# Patient Record
Sex: Male | Born: 1935 | Race: White | Hispanic: No | Smoking: Never smoker
Health system: Southern US, Community
[De-identification: ages and names within clinical notes are randomized; demographics above are authoritative.]

## PROBLEM LIST (undated history)

## (undated) DIAGNOSIS — R9430 Abnormal result of cardiovascular function study, unspecified: Secondary | ICD-10-CM

## (undated) DIAGNOSIS — W19XXXA Unspecified fall, initial encounter: Secondary | ICD-10-CM

## (undated) DIAGNOSIS — R001 Bradycardia, unspecified: Secondary | ICD-10-CM

## (undated) DIAGNOSIS — R39198 Other difficulties with micturition: Secondary | ICD-10-CM

## (undated) DIAGNOSIS — I358 Other nonrheumatic aortic valve disorders: Secondary | ICD-10-CM

## (undated) DIAGNOSIS — M1990 Unspecified osteoarthritis, unspecified site: Secondary | ICD-10-CM

## (undated) DIAGNOSIS — I4891 Unspecified atrial fibrillation: Secondary | ICD-10-CM

## (undated) DIAGNOSIS — N183 Chronic kidney disease, stage 3 unspecified: Secondary | ICD-10-CM

## (undated) DIAGNOSIS — I4892 Unspecified atrial flutter: Secondary | ICD-10-CM

## (undated) DIAGNOSIS — I351 Nonrheumatic aortic (valve) insufficiency: Secondary | ICD-10-CM

## (undated) DIAGNOSIS — K589 Irritable bowel syndrome without diarrhea: Secondary | ICD-10-CM

## (undated) HISTORY — DX: Abnormal result of cardiovascular function study, unspecified: R94.30

## (undated) HISTORY — PX: HAND SURGERY: SHX662

## (undated) HISTORY — PX: CATARACT EXTRACTION W/ INTRAOCULAR LENS  IMPLANT, BILATERAL: SHX1307

## (undated) HISTORY — DX: Unspecified osteoarthritis, unspecified site: M19.90

## (undated) HISTORY — PX: BACK SURGERY: SHX140

## (undated) HISTORY — DX: Unspecified atrial flutter: I48.92

## (undated) HISTORY — DX: Other nonrheumatic aortic valve disorders: I35.8

## (undated) HISTORY — DX: Irritable bowel syndrome, unspecified: K58.9

## (undated) HISTORY — DX: Nonrheumatic aortic (valve) insufficiency: I35.1

## (undated) HISTORY — DX: Bradycardia, unspecified: R00.1

## (undated) HISTORY — PX: FRACTURE SURGERY: SHX138

## (undated) HISTORY — DX: Other difficulties with micturition: R39.198

---

## 1970-08-31 HISTORY — PX: LEG SURGERY: SHX1003

## 1999-03-13 ENCOUNTER — Emergency Department (HOSPITAL_COMMUNITY): Admission: EM | Admit: 1999-03-13 | Discharge: 1999-03-13 | Payer: Self-pay | Admitting: *Deleted

## 1999-03-13 ENCOUNTER — Encounter: Payer: Self-pay | Admitting: Surgery

## 2008-02-15 ENCOUNTER — Emergency Department (HOSPITAL_COMMUNITY): Admission: EM | Admit: 2008-02-15 | Discharge: 2008-02-16 | Payer: Self-pay | Admitting: Emergency Medicine

## 2008-02-20 ENCOUNTER — Encounter: Admission: RE | Admit: 2008-02-20 | Discharge: 2008-02-20 | Payer: Self-pay | Admitting: Orthopedic Surgery

## 2008-02-22 ENCOUNTER — Ambulatory Visit (HOSPITAL_COMMUNITY): Admission: RE | Admit: 2008-02-22 | Discharge: 2008-02-22 | Payer: Self-pay | Admitting: Orthopedic Surgery

## 2008-03-07 ENCOUNTER — Encounter: Admission: RE | Admit: 2008-03-07 | Discharge: 2008-03-07 | Payer: Self-pay | Admitting: Radiology

## 2009-06-22 ENCOUNTER — Emergency Department (HOSPITAL_COMMUNITY): Admission: EM | Admit: 2009-06-22 | Discharge: 2009-06-23 | Payer: Self-pay | Admitting: Emergency Medicine

## 2010-12-04 LAB — DIFFERENTIAL
Basophils Relative: 0 % (ref 0–1)
Eosinophils Absolute: 0 10*3/uL (ref 0.0–0.7)
Lymphs Abs: 0.9 10*3/uL (ref 0.7–4.0)
Neutro Abs: 5.2 10*3/uL (ref 1.7–7.7)
Neutrophils Relative %: 72 % (ref 43–77)

## 2010-12-04 LAB — BASIC METABOLIC PANEL
BUN: 12 mg/dL (ref 6–23)
Calcium: 8.6 mg/dL (ref 8.4–10.5)
Chloride: 100 mEq/L (ref 96–112)
Creatinine, Ser: 0.9 mg/dL (ref 0.4–1.5)
GFR calc Af Amer: >60 mL/min (ref >60)

## 2010-12-04 LAB — CBC
MCHC: 33.7 g/dL (ref 30.0–36.0)
MCV: 99.9 fL (ref 78.0–100.0)
Platelets: 212 10*3/uL (ref 150–400)
WBC: 7.1 10*3/uL (ref 4.0–10.5)

## 2011-03-02 DIAGNOSIS — K4020 Bilateral inguinal hernia, without obstruction or gangrene, not specified as recurrent: Secondary | ICD-10-CM

## 2011-03-02 HISTORY — PX: INGUINAL HERNIA REPAIR: SUR1180

## 2011-03-16 ENCOUNTER — Ambulatory Visit (INDEPENDENT_AMBULATORY_CARE_PROVIDER_SITE_OTHER): Payer: Medicare Other | Admitting: Surgery

## 2011-03-16 ENCOUNTER — Encounter (INDEPENDENT_AMBULATORY_CARE_PROVIDER_SITE_OTHER): Payer: Self-pay | Admitting: Surgery

## 2011-03-16 DIAGNOSIS — K4090 Unilateral inguinal hernia, without obstruction or gangrene, not specified as recurrent: Secondary | ICD-10-CM

## 2011-03-16 NOTE — Progress Notes (Signed)
Subjective:     Patient ID: Patrick Walker, male   DOB: November 25, 1924, 77 y.o.   MRN: [id number]  HPI  The patient comes today feeling better. Has some abdominal soreness. Has a lump in the left groin.  Constipation resolved. Urinating fine. Not taking any pain meds. And comes today with his son. Once to drive his tractor. Review of Systems  Constitutional: Negative for fever, chills and diaphoresis.  HENT: Negative for nosebleeds, sore throat, facial swelling, mouth sores, trouble swallowing and ear discharge.   Eyes: Negative for photophobia, discharge and visual disturbance.  Respiratory: Negative for choking, chest tightness, shortness of breath and stridor.   Cardiovascular: Negative for chest pain and palpitations.  Gastrointestinal: Negative for nausea, vomiting, diarrhea, constipation, blood in stool, abdominal distention, anal bleeding and rectal pain.  Genitourinary: Negative for dysuria, urgency, difficulty urinating and testicular pain.  Musculoskeletal: Negative for myalgias, back pain, arthralgias and gait problem.  Skin: Negative for color change, pallor, rash and wound.  Neurological: Negative for dizziness, speech difficulty, weakness, numbness and headaches.  Hematological: Negative for adenopathy. Does not bruise/bleed easily.  Psychiatric/Behavioral: Negative for hallucinations, confusion and agitation.       Objective:   Physical Exam  Constitutional: He is oriented to person, place, and time. He appears well-developed and well-nourished. No distress.  HENT:  Head: Normocephalic.  Right Ear: Decreased hearing is noted.  Left Ear: Decreased hearing is noted.  Mouth/Throat: Oropharynx is clear and moist. No oropharyngeal exudate.  Eyes: Conjunctivae and EOM are normal. Pupils are equal, round, and reactive to light. No scleral icterus.  Neck: Normal range of motion. Neck supple. No tracheal deviation present.  Cardiovascular: Normal rate, regular rhythm and intact  distal pulses.   Pulmonary/Chest: Effort normal and breath sounds normal. No respiratory distress.  Abdominal: Soft. He exhibits no distension. There is no tenderness. Hernia confirmed negative in the right inguinal area and confirmed negative in the left inguinal area.       Incisions c/d/i.  Mild soreness  Genitourinary: Penis normal. No penile tenderness.       Small L groin seroma 1x2 cm.  No hernia  Musculoskeletal: Normal range of motion. He exhibits no tenderness.  Lymphadenopathy:    He has no cervical adenopathy.       Right: No inguinal adenopathy present.       Left: No inguinal adenopathy present.  Neurological: He is alert and oriented to person, place, and time. No cranial nerve deficit. He exhibits normal muscle tone. Coordination normal.  Skin: Skin is warm and dry. No rash noted. He is not diaphoretic. No erythema. No pallor.  Psychiatric: He has a normal mood and affect. His behavior is normal. Judgment and thought content normal.       Assessment:     Laparoscopic left inguinal hernia repair  Small postoperative seroma. Doing well.    Plan:     Increase activity as tolerated. Do not push through pain. Consider Tylenol and heat for soreness. Seroma should resolve.  Return to clinic p.r.n. He and his son expressed appreciation.

## 2011-05-28 LAB — BASIC METABOLIC PANEL
Calcium: 9.1
GFR calc Af Amer: >60
GFR calc non Af Amer: >60
Potassium: 4.2
Sodium: 138

## 2011-05-28 LAB — PROTIME-INR: INR: 1

## 2011-05-28 LAB — CBC
Hemoglobin: 13.6
RBC: 3.88 — ABNORMAL LOW
WBC: 4

## 2011-07-28 ENCOUNTER — Other Ambulatory Visit (HOSPITAL_COMMUNITY): Payer: Self-pay | Admitting: Radiology

## 2011-07-28 ENCOUNTER — Ambulatory Visit (HOSPITAL_COMMUNITY): Payer: Medicare Other | Attending: Cardiology | Admitting: Radiology

## 2011-07-28 ENCOUNTER — Encounter: Payer: Self-pay | Admitting: Cardiology

## 2011-07-28 ENCOUNTER — Ambulatory Visit (INDEPENDENT_AMBULATORY_CARE_PROVIDER_SITE_OTHER): Payer: Medicare Other | Admitting: Cardiology

## 2011-07-28 VITALS — BP 153/98 | HR 150 | Ht 63.0 in | Wt 117.8 lb

## 2011-07-28 DIAGNOSIS — I059 Rheumatic mitral valve disease, unspecified: Secondary | ICD-10-CM | POA: Insufficient documentation

## 2011-07-28 DIAGNOSIS — I379 Nonrheumatic pulmonary valve disorder, unspecified: Secondary | ICD-10-CM | POA: Insufficient documentation

## 2011-07-28 DIAGNOSIS — I4892 Unspecified atrial flutter: Secondary | ICD-10-CM

## 2011-07-28 DIAGNOSIS — E038 Other specified hypothyroidism: Secondary | ICD-10-CM

## 2011-07-28 DIAGNOSIS — R0989 Other specified symptoms and signs involving the circulatory and respiratory systems: Secondary | ICD-10-CM | POA: Insufficient documentation

## 2011-07-28 DIAGNOSIS — I079 Rheumatic tricuspid valve disease, unspecified: Secondary | ICD-10-CM | POA: Insufficient documentation

## 2011-07-28 DIAGNOSIS — R0609 Other forms of dyspnea: Secondary | ICD-10-CM | POA: Insufficient documentation

## 2011-07-28 DIAGNOSIS — I319 Disease of pericardium, unspecified: Secondary | ICD-10-CM | POA: Insufficient documentation

## 2011-07-28 DIAGNOSIS — H9190 Unspecified hearing loss, unspecified ear: Secondary | ICD-10-CM

## 2011-07-28 DIAGNOSIS — R0602 Shortness of breath: Secondary | ICD-10-CM

## 2011-07-28 MED ORDER — DILTIAZEM HCL 240 MG PO CP24: 240.0000 mg | ORAL_CAPSULE | Freq: Every day | ORAL | Status: DC

## 2011-07-28 MED ORDER — WARFARIN SODIUM 2.5 MG PO TABS: ORAL_TABLET | ORAL | Status: DC

## 2011-07-28 MED ORDER — DIGOXIN 125 MCG PO TABS: 125.0000 ug | ORAL_TABLET | Freq: Every day | ORAL | Status: DC

## 2011-07-28 NOTE — Assessment & Plan Note (Signed)
At this point there is no sign of overt CHF.  I believe his symptoms are related to his heart rate.  I chosen not give him a diuretic.

## 2011-07-28 NOTE — Assessment & Plan Note (Signed)
The patient has atrial flutter with a rapid ventricular response.  Clinically it would appear that this has begun in the last few weeks.  I am trying to document an old EKG.  He has shortness of breath but I suspect this is related to the rapid rate.  Two-dimensional echo was done today.  It was technically difficult because of the rapid rate.  However the overall ejection fraction appeared to be in the low normal range.  I will add diltiazem 240 mg long-acting.  I believe that his pressure will support this well and he needs this for rate control.  I will see him for early followup to be sure that his rate is being controlled.  Also we had a very careful discussion about anticoagulation.  He and I and his family agreed that Coumadin should be started and we have started today.  Once he is fully anticoagulated I will consider a one-time cardioversion. I have obtained an EKG from 2009 area he appears to have sinus rhythm with a short PR interval.

## 2011-07-28 NOTE — Progress Notes (Signed)
HPI  The patient is seen today for the evaluation of rapid atrial flutter.  He has had some shortness of breath for a few weeks.  He has noted this at nighttime and some with exertion.  There has not been chest pain.  He saw his primary physician and atrial flutter was noted and digoxin was added.  Arrangements were made for two-dimensional echo.  When the patient came today for his echo, his atrial flutter rate was still fast.  The study was technically difficult with his rapid rate but the EF appeared to be in the normal range.  He was still having some shortness of breath.  I decided it was most appropriate for me to evaluate him further.  The patient does not feel the rapid heartbeat.  He is not had any syncope or presyncope.  No Known Allergies  Current Outpatient Prescriptions  Medication Sig Dispense Refill  . aspirin 81 MG tablet Take 81 mg by mouth as needed.        . Loperamide HCl (IMODIUM PO) Take by mouth as needed.        . digoxin (LANOXIN) 0.125 MG tablet Take 1 tablet (125 mcg total) by mouth daily.      Marland Kitchen diltiazem (DILACOR XR) 240 MG 24 hr capsule Take 1 capsule (240 mg total) by mouth daily.  30 capsule  11  . warfarin (COUMADIN) 2.5 MG tablet AS DIRECTED./CY  45 tablet  3    History   Social History  . Marital Status: Married    Spouse Name: N/A    Number of Children: N/A  . Years of Education: N/A   Occupational History  . Not on file.   Social History Main Topics  . Smoking status: Never Smoker   . Smokeless tobacco: Not on file  . Alcohol Use: No  . Drug Use: No  . Sexually Active:    Other Topics Concern  . Not on file   Social History Narrative  . No narrative on file    No family history on file.  Past Medical History  Diagnosis Date  . Irritable bowel disease   . Difficulty in urination   . Hearing loss   . Glaucoma   . Arthritis   . Atrial flutter     New diagnosis November, 2012  . TSH (thyroid-stimulating hormone deficiency)     TSH  6.4,  November, 2012    Past Surgical History  Procedure Date  . Hernia repair 03/02/2011  . Back surgery 2008-2009  . Hand surgery 1984-85  . Leg surgery 1972    ROS    Patient denies fever, chills, headache, sweats, rash, change in vision, change in hearing, chest pain, cough, nausea vomiting, urinary symptoms.  All other systems are reviewed and are negative.  PHYSICAL EXAM Patient is quite stable.  He is here with his wife and his son.  He has decreased hearing but he is able to hear me.  He is oriented to person time and place.  Affect is normal.  Head is atraumatic.  There is no jugular venous distention.  There were no carotid bruits.  Lungs are clear.  Respiratory effort is nonlabored.  Cardiac exam reveals S1 and S2.  There no significant murmurs.  The abdomen is soft.  There is no peripheral edema.  There no musculoskeletal deformities.  There are no skin rashes.  Filed Vitals:   07/28/11 1028  BP: 153/98  Pulse: 150  Height: 5\' 3"  (1.6 m)  Weight: 117 lb 12.8 oz (53.434 kg)    EKG  EKG done today reveals atrial flutter with a rapid ventricular rate.  The rate is approximately 145.  I am trying to obtain a prior EKG.  ASSESSMENT & PLAN

## 2011-07-28 NOTE — Patient Instructions (Signed)
Your physician recommends that you schedule a follow-up appointment in: SEE DR KATZ ON MON  AND [facility]IN [facility]INIC ON MON  [facility] Your physician has recommended you make the following change in your medication:  TAKE ASPIRIN TODAY AND TOMORROW THEN STOP  START COUMADIN (WARFARIN )2.5 MG EVERY DAY START DILTIAZEM 240 MG EVERY DAY

## 2011-07-28 NOTE — Assessment & Plan Note (Signed)
There is an incidental finding of slight elevation of TSH.  This can be followed over time.

## 2011-07-29 ENCOUNTER — Encounter (HOSPITAL_COMMUNITY): Payer: Self-pay | Admitting: Family Medicine

## 2011-08-03 ENCOUNTER — Encounter: Payer: Self-pay | Admitting: Cardiology

## 2011-08-03 DIAGNOSIS — I358 Other nonrheumatic aortic valve disorders: Secondary | ICD-10-CM | POA: Insufficient documentation

## 2011-08-03 DIAGNOSIS — I351 Nonrheumatic aortic (valve) insufficiency: Secondary | ICD-10-CM | POA: Insufficient documentation

## 2011-08-04 ENCOUNTER — Encounter: Payer: Self-pay | Admitting: Cardiology

## 2011-08-04 ENCOUNTER — Ambulatory Visit (INDEPENDENT_AMBULATORY_CARE_PROVIDER_SITE_OTHER): Payer: Medicare Other | Admitting: Cardiology

## 2011-08-04 ENCOUNTER — Ambulatory Visit (INDEPENDENT_AMBULATORY_CARE_PROVIDER_SITE_OTHER): Payer: Medicare Other | Admitting: *Deleted

## 2011-08-04 DIAGNOSIS — Z7901 Long term (current) use of anticoagulants: Secondary | ICD-10-CM | POA: Insufficient documentation

## 2011-08-04 DIAGNOSIS — R0602 Shortness of breath: Secondary | ICD-10-CM

## 2011-08-04 DIAGNOSIS — I359 Nonrheumatic aortic valve disorder, unspecified: Secondary | ICD-10-CM

## 2011-08-04 DIAGNOSIS — I4892 Unspecified atrial flutter: Secondary | ICD-10-CM

## 2011-08-04 DIAGNOSIS — I358 Other nonrheumatic aortic valve disorders: Secondary | ICD-10-CM

## 2011-08-04 LAB — POCT INR: INR: 1.3

## 2011-08-04 MED ORDER — FUROSEMIDE 20 MG PO TABS: 20.0000 mg | ORAL_TABLET | Freq: Every day | ORAL | Status: DC

## 2011-08-04 NOTE — Patient Instructions (Signed)
Your physician has recommended you make the following change in your medication: Start taking Lasix 20mg  daily  Your physician recommends that you schedule a follow-up appointment in:  August 20, 2011

## 2011-08-04 NOTE — Assessment & Plan Note (Signed)
Some of his shortness of breath could be related to his atrial flutter.  However he says he has mild edema.  On physical exam there is trace edema today.  I will start him on 20 mg of Lasix daily and

## 2011-08-04 NOTE — Progress Notes (Signed)
HPI  The patient is seen today for early followup evaluation of his atrial flutter.  I had seen him as a new patient in the office on July 28, 2011.  He had rapid atrial flutter at that time.  He did have some shortness of breath.  His echo showed good left ventricular function despite the rapid atrial flutter.  His renal function was good.  Decision was made to use low dose digoxin along with diltiazem.  His rate is now under much better control.  He continues in atrial flutter.  He is having frequent urination at night.  I believe that this is an old problem and probably related to prostate problems that will need to be evaluated.  He does have some shortness of breath.  There may be a mild component of orthopnea.  He says he has mild swelling in his feet also.  No Known Allergies  Current Outpatient Prescriptions  Medication Sig Dispense Refill  . aspirin 81 MG tablet Take 81 mg by mouth as needed.        . digoxin (LANOXIN) 0.125 MG tablet Take 1 tablet (125 mcg total) by mouth daily.      Marland Kitchen diltiazem (DILACOR XR) 240 MG 24 hr capsule Take 1 capsule (240 mg total) by mouth daily.  30 capsule  11  . Loperamide HCl (IMODIUM PO) Take by mouth as needed.        . warfarin (COUMADIN) 2.5 MG tablet AS DIRECTED./CY  45 tablet  3    History   Social History  . Marital Status: Married    Spouse Name: N/A    Number of Children: N/A  . Years of Education: N/A   Occupational History  . Not on file.   Social History Main Topics  . Smoking status: Never Smoker   . Smokeless tobacco: Not on file  . Alcohol Use: No  . Drug Use: No  . Sexually Active:    Other Topics Concern  . Not on file   Social History Narrative  . No narrative on file    No family history on file.  Past Medical History  Diagnosis Date  . Irritable bowel disease   . Difficulty in urination   . Hearing loss   . Glaucoma   . Arthritis   . Atrial flutter     New diagnosis November, 2012  . TSH  (thyroid-stimulating hormone deficiency)     TSH 6.4,  November, 2012  . Shortness of breath     November, 2012  . Ejection fraction     EF 55-60%, echo, November, 2012 ( rapid atrial flutter at that time)  . Aortic insufficiency     Mild, echo, November, 2011  . Aortic valve sclerosis     Echo, November, 2011    Past Surgical History  Procedure Date  . Hernia repair 03/02/2011  . Back surgery 2008-2009  . Hand surgery 1984-85  . Leg surgery 1972    ROS   Patient is hard of hearing.  His family is present.  He does communicate well.  He denies ear, chills, headache, sweats, rash change in vision, change in hearing, chest pain, cough, nausea vomiting, .  All of the systems are reviewed and are negative.  PHYSICAL EXAM   Patient is stable.  There is no jugular venous distention.  Lungs are clear.  Respiratory effort is nonlabored.  Cardiac exam reveals S1 and S2.  No clicks or significant murmurs.  The abdomen is soft.  There is trace peripheral edema.  There are no musculoskeletal deformities.  There no skin rashes.  Filed Vitals:   08/04/11 1604  BP: 136/64  Pulse: 74  Height: 5\' 3"  (1.6 m)  Weight: 117 lb (53.071 kg)    EKG  EKG is done today and reviewed by me.  He continues in atrial flutter.  Most of the time he has 4-1 block with a rate in the range of 75. ASSESSMENT & PLAN

## 2011-08-04 NOTE — Progress Notes (Signed)
Addended by: Burnett Kanaris A on: 08/04/2011 03:14 PM   Modules accepted: Orders

## 2011-08-04 NOTE — Assessment & Plan Note (Signed)
He does have mild aortic valvular disease.  His needs no further workup at this time.

## 2011-08-04 NOTE — Assessment & Plan Note (Signed)
The patient's atrial flutter continues but his rate is under much better control.  I will be keeping him on the current dose of diltiazem and digoxin.  He is being coumadinized.  When he is fully and carefully coumadinized we will proceed with cardioversion.  He is stable enough that he does not need TEE cardioversion.

## 2011-08-04 NOTE — Patient Instructions (Addendum)
Patient is hard of hearing, patients wife and son are present with nurse visit. Provided patient with list of vitamin K foods and coumadin booklet.

## 2011-08-11 ENCOUNTER — Ambulatory Visit (INDEPENDENT_AMBULATORY_CARE_PROVIDER_SITE_OTHER): Payer: Medicare Other | Admitting: *Deleted

## 2011-08-11 DIAGNOSIS — Z7901 Long term (current) use of anticoagulants: Secondary | ICD-10-CM

## 2011-08-11 DIAGNOSIS — I4892 Unspecified atrial flutter: Secondary | ICD-10-CM

## 2011-08-11 DIAGNOSIS — I359 Nonrheumatic aortic valve disorder, unspecified: Secondary | ICD-10-CM

## 2011-08-11 DIAGNOSIS — I358 Other nonrheumatic aortic valve disorders: Secondary | ICD-10-CM

## 2011-08-11 LAB — POCT INR: INR: 1.5

## 2011-08-20 ENCOUNTER — Encounter: Payer: Self-pay | Admitting: Cardiology

## 2011-08-20 ENCOUNTER — Ambulatory Visit (INDEPENDENT_AMBULATORY_CARE_PROVIDER_SITE_OTHER): Payer: Medicare Other | Admitting: *Deleted

## 2011-08-20 ENCOUNTER — Ambulatory Visit (INDEPENDENT_AMBULATORY_CARE_PROVIDER_SITE_OTHER): Payer: Medicare Other | Admitting: Cardiology

## 2011-08-20 DIAGNOSIS — Z7901 Long term (current) use of anticoagulants: Secondary | ICD-10-CM

## 2011-08-20 DIAGNOSIS — I4892 Unspecified atrial flutter: Secondary | ICD-10-CM

## 2011-08-20 DIAGNOSIS — I358 Other nonrheumatic aortic valve disorders: Secondary | ICD-10-CM

## 2011-08-20 DIAGNOSIS — R0602 Shortness of breath: Secondary | ICD-10-CM

## 2011-08-20 DIAGNOSIS — I359 Nonrheumatic aortic valve disorder, unspecified: Secondary | ICD-10-CM

## 2011-08-20 LAB — BASIC METABOLIC PANEL
Calcium: 9.1 mg/dL (ref 8.4–10.5)
GFR: 66.36 mL/min (ref >60.00)
Glucose, Bld: 81 mg/dL (ref 70–99)
Sodium: 142 mEq/L (ref 135–145)

## 2011-08-20 LAB — POCT INR: INR: 2.4

## 2011-08-20 MED ORDER — DILTIAZEM HCL ER COATED BEADS 240 MG PO CP24: 240.0000 mg | ORAL_CAPSULE | Freq: Every day | ORAL | Status: DC

## 2011-08-20 MED ORDER — WARFARIN SODIUM 2.5 MG PO TABS: ORAL_TABLET | ORAL | Status: DC

## 2011-08-20 MED ORDER — FUROSEMIDE 20 MG PO TABS: 20.0000 mg | ORAL_TABLET | Freq: Every day | ORAL | Status: DC

## 2011-08-20 NOTE — Assessment & Plan Note (Signed)
The patient is on Coumadin. We will stop his aspirin. Coumadin will be continued long term for now related to his atrial flutter.

## 2011-08-20 NOTE — Assessment & Plan Note (Signed)
Atrial flutter has converted spontaneously to sinus rhythm. He does have sinus bradycardia. He is on digoxin and diltiazem. I have continued them both for a brief period because his rate was relatively fast with atrial flutter. Now that he has sinus rhythm I will stop his digoxin.

## 2011-08-20 NOTE — Progress Notes (Signed)
HPI  Patient returns today to followup atrial flutter. I saw him last August 04, 2011. His atrial flutter had been newly diagnosed. I had started him on Coumadin. When I saw him last he had some mild volume overload. A very low dose of Lasix was started. Since that time he has diuresis a few pounds. He is feeling better. He does not sense whether he has a rapid heart beat or not. EKG today in the office reveals that he has spontaneously converted to sinus rhythm since his last visit. His coumadinization is going well.  No Known Allergies  Current Outpatient Prescriptions  Medication Sig Dispense Refill  . aspirin 81 MG tablet Take 81 mg by mouth as needed.        . digoxin (LANOXIN) 0.125 MG tablet Take 1 tablet (125 mcg total) by mouth daily.      Marland Kitchen diltiazem (DILACOR XR) 240 MG 24 hr capsule Take 1 capsule (240 mg total) by mouth daily.  30 capsule  11  . furosemide (LASIX) 20 MG tablet Take 1 tablet (20 mg total) by mouth daily.  30 tablet  11  . Loperamide HCl (IMODIUM PO) Take by mouth as needed.        . warfarin (COUMADIN) 2.5 MG tablet AS DIRECTED./CY  45 tablet  3    History   Social History  . Marital Status: Married    Spouse Name: N/A    Number of Children: N/A  . Years of Education: N/A   Occupational History  . Not on file.   Social History Main Topics  . Smoking status: Never Smoker   . Smokeless tobacco: Not on file  . Alcohol Use: No  . Drug Use: No  . Sexually Active:    Other Topics Concern  . Not on file   Social History Narrative  . No narrative on file    No family history on file.  Past Medical History  Diagnosis Date  . Irritable bowel disease   . Difficulty in urination   . Hearing loss   . Glaucoma   . Arthritis   . Atrial flutter     New diagnosis November, 2012  . TSH (thyroid-stimulating hormone deficiency)     TSH 6.4,  November, 2012  . Shortness of breath     November, 2012  . Ejection fraction     EF 55-60%, echo, November,  2012 ( rapid atrial flutter at that time)  . Aortic insufficiency     Mild, echo, November, 2011  . Aortic valve sclerosis     Echo, November, 2011    Past Surgical History  Procedure Date  . Hernia repair 03/02/2011  . Back surgery 2008-2009  . Hand surgery 1984-85  . Leg surgery 1972    ROS  Patient denies fever, chills, headache, sweats, rash, change in vision, change in hearing, chest pain, cough, nausea vomiting, urinary symptoms. All other systems are reviewed and are negative.   PHYSICAL EXAM Patient is stable. He's here with his wife and his son. There is no jugulovenous distention. Lungs are clear. Respiratory effort is nonlabored. Cardiac exam reveals that his rhythm is regular. There are no significant murmurs. Abdomen is soft. There is no significant peripheral edema today. There are no musculoskeletal deformities. There are no skin rashes.  Filed Vitals:   08/20/11 1019  BP: 148/73  Pulse: 55  Height: 5\' 3"  (1.6 m)  Weight: 113 lb 6.4 oz (51.438 kg)    EKG  EKG  is done today and reviewed by me. He has converted to sinus rhythm. There is sinus bradycardia.  ASSESSMENT & PLAN

## 2011-08-20 NOTE — Assessment & Plan Note (Signed)
Shortness of breath is improved. This is probably a function of returning to sinus rhythm and some mild diuresis. Low-dose diuretic will be continued. Chemistry will be checked today. I'll see him back for followup in 8 weeks

## 2011-08-20 NOTE — Patient Instructions (Signed)
Your physician has recommended you make the following change in your medication:  Stop your Aspirin and Digoxin.  Do not throw it away.  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

## 2011-08-27 ENCOUNTER — Ambulatory Visit (INDEPENDENT_AMBULATORY_CARE_PROVIDER_SITE_OTHER): Payer: Medicare Other | Admitting: *Deleted

## 2011-08-27 DIAGNOSIS — I359 Nonrheumatic aortic valve disorder, unspecified: Secondary | ICD-10-CM

## 2011-08-27 DIAGNOSIS — I358 Other nonrheumatic aortic valve disorders: Secondary | ICD-10-CM

## 2011-08-27 DIAGNOSIS — I4892 Unspecified atrial flutter: Secondary | ICD-10-CM

## 2011-08-27 DIAGNOSIS — Z7901 Long term (current) use of anticoagulants: Secondary | ICD-10-CM

## 2011-08-27 LAB — POCT INR: INR: 1.9

## 2011-09-04 ENCOUNTER — Ambulatory Visit (INDEPENDENT_AMBULATORY_CARE_PROVIDER_SITE_OTHER): Payer: Medicare Other | Admitting: *Deleted

## 2011-09-04 DIAGNOSIS — I4892 Unspecified atrial flutter: Secondary | ICD-10-CM

## 2011-09-04 DIAGNOSIS — I359 Nonrheumatic aortic valve disorder, unspecified: Secondary | ICD-10-CM

## 2011-09-04 DIAGNOSIS — Z7901 Long term (current) use of anticoagulants: Secondary | ICD-10-CM

## 2011-09-04 DIAGNOSIS — I358 Other nonrheumatic aortic valve disorders: Secondary | ICD-10-CM

## 2011-09-04 LAB — POCT INR: INR: 1.8

## 2011-09-09 ENCOUNTER — Telehealth: Payer: Self-pay | Admitting: Cardiology

## 2011-09-09 NOTE — Telephone Encounter (Signed)
Returned call to pt's wife, pt vomited and had diarrhea yesterday in the pm after taking Coumadin, afraid dosage taken 2 tablets did not get metabolized appropriately.  Pt is supposed to take 1 tablet today, INR on 09/04/11 was 1.8.  Pt's wife states diarrhea and vomiting have seemed to resolved, no further episodes this am so far.  Advised pt's wife it would be ok to take 2 tablets today in the place of his normal 1 tablet dosage today only, then resume normal dosage and keep scheduled f/u appt on 09/14/11.  Also advised pt's wife Coumadin can be take with or without food, it is not harsh or irritating to the stomach.

## 2011-09-09 NOTE — Telephone Encounter (Signed)
New problem Pt's wife called she said he got sick last night after taking coumadin, with vomiting and diarrhea. Please call her back

## 2011-09-09 NOTE — Telephone Encounter (Signed)
Pt took his coumadin last night and then vomited right after.  He is concerned because the coumadin didn't get into his system.  Last night was his night to take two pills and he isn't sure if he should take extra today or just the normal dose since his last inr was low.  Please advise.  He also wants to know if it is better to take the coumadin with food or on an empty stomach.

## 2011-09-14 ENCOUNTER — Ambulatory Visit (INDEPENDENT_AMBULATORY_CARE_PROVIDER_SITE_OTHER): Payer: Medicare Other | Admitting: *Deleted

## 2011-09-14 DIAGNOSIS — I358 Other nonrheumatic aortic valve disorders: Secondary | ICD-10-CM

## 2011-09-14 DIAGNOSIS — I359 Nonrheumatic aortic valve disorder, unspecified: Secondary | ICD-10-CM

## 2011-09-14 DIAGNOSIS — Z7901 Long term (current) use of anticoagulants: Secondary | ICD-10-CM

## 2011-09-14 DIAGNOSIS — I4892 Unspecified atrial flutter: Secondary | ICD-10-CM

## 2011-09-14 LAB — POCT INR: INR: 2.2

## 2011-09-28 ENCOUNTER — Ambulatory Visit (INDEPENDENT_AMBULATORY_CARE_PROVIDER_SITE_OTHER): Payer: Medicare Other | Admitting: Pharmacist

## 2011-09-28 DIAGNOSIS — I4892 Unspecified atrial flutter: Secondary | ICD-10-CM

## 2011-09-28 DIAGNOSIS — Z7901 Long term (current) use of anticoagulants: Secondary | ICD-10-CM

## 2011-10-20 ENCOUNTER — Ambulatory Visit (INDEPENDENT_AMBULATORY_CARE_PROVIDER_SITE_OTHER): Payer: Medicare Other | Admitting: Cardiology

## 2011-10-20 ENCOUNTER — Ambulatory Visit (INDEPENDENT_AMBULATORY_CARE_PROVIDER_SITE_OTHER): Payer: Medicare Other | Admitting: Pharmacist

## 2011-10-20 ENCOUNTER — Encounter: Payer: Self-pay | Admitting: Cardiology

## 2011-10-20 ENCOUNTER — Telehealth: Payer: Self-pay | Admitting: Cardiology

## 2011-10-20 DIAGNOSIS — I4892 Unspecified atrial flutter: Secondary | ICD-10-CM

## 2011-10-20 DIAGNOSIS — I359 Nonrheumatic aortic valve disorder, unspecified: Secondary | ICD-10-CM

## 2011-10-20 DIAGNOSIS — I358 Other nonrheumatic aortic valve disorders: Secondary | ICD-10-CM

## 2011-10-20 DIAGNOSIS — R0602 Shortness of breath: Secondary | ICD-10-CM

## 2011-10-20 DIAGNOSIS — Z7901 Long term (current) use of anticoagulants: Secondary | ICD-10-CM

## 2011-10-20 NOTE — Assessment & Plan Note (Signed)
Patient has no further shortness of breath. I have used a small dose of a diuretic. His renal function was checked at the last visit and there was very slight increase in BUN. It is now time to stop his diuretic completely. If he has any returning shortness of breath or any return of his edema we will restart a low dose.

## 2011-10-20 NOTE — Patient Instructions (Signed)
Your physician recommends that you schedule a follow-up appointment in: 3 months.    We will call you this afternoon or tomorrow to check on your Lasix.

## 2011-10-20 NOTE — Telephone Encounter (Signed)
Patient wife [place]  Called to report patient current medications:  Diltiazem     Coumadin   Patient hasn't taken lasixs for a while

## 2011-10-20 NOTE — Telephone Encounter (Signed)
Information corrected on chart and given to Dr Myrtis Ser

## 2011-10-20 NOTE — Progress Notes (Signed)
HPI Patient is seen to follow up as atrial flutter.I had been preparing him for possible cardioversion and then he spontaneously converted. I saw him last in December, 2012. He continues to do well. He's not having any chest pain or shortness of breath. No Known Allergies  Current Outpatient Prescriptions  Medication Sig Dispense Refill  . diltiazem (CARTIA XT) 240 MG 24 hr capsule Take 1 capsule (240 mg total) by mouth daily.  30 capsule  11  . Loperamide HCl (IMODIUM PO) Take by mouth as needed.        . warfarin (COUMADIN) 2.5 MG tablet AS DIRECTED./CY  99 tablet  0  . furosemide (LASIX) 20 MG tablet Take 1 tablet (20 mg total) by mouth daily.  30 tablet  11    History   Social History  . Marital Status: Married    Spouse Name: N/A    Number of Children: N/A  . Years of Education: N/A   Occupational History  . Not on file.   Social History Main Topics  . Smoking status: Never Smoker   . Smokeless tobacco: Not on file  . Alcohol Use: No  . Drug Use: No  . Sexually Active:    Other Topics Concern  . Not on file   Social History Narrative  . No narrative on file    No family history on file.  Past Medical History  Diagnosis Date  . Irritable bowel disease   . Difficulty in urination   . Hearing loss   . Glaucoma   . Arthritis   . Atrial flutter     New diagnosis November, 2012  . TSH (thyroid-stimulating hormone deficiency)     TSH 6.4,  November, 2012  . Shortness of breath     November, 2012  . Ejection fraction     EF 55-60%, echo, November, 2012 ( rapid atrial flutter at that time)  . Aortic insufficiency     Mild, echo, November, 2011  . Aortic valve sclerosis     Echo, November, 2011    Past Surgical History  Procedure Date  . Hernia repair 03/02/2011  . Back surgery 2008-2009  . Hand surgery 1984-85  . Leg surgery 1972    ROS  Patient denies fever, chills, headache, sweats, rash, change in vision, change in hearing, chest pain, cough, nausea  vomiting, urinary symptoms. All other systems are reviewed and are negative.  PHYSICAL EXAM Patient is oriented to person time and place. He is here with his wife and his son. Lungs are clear. Respiratory effort is nonlabored. Cardiac exam is S1 and S2. There no clicks.There is a soft systolic murmur.. The abdomen is soft. Is no peripheral edema. Filed Vitals:   10/20/11 1346  BP: 132/68  Pulse: 70  Resp: 18  Height: 5\' 2"  (1.575 m)  Weight: 128 lb (58.06 kg)     ASSESSMENT & PLAN

## 2011-10-20 NOTE — Assessment & Plan Note (Signed)
Patient is on Coumadin. His wife asked about other treatments. I had a careful and complete discussion with the patient his wife and his son. For now they prefer to remain on Coumadin.

## 2011-10-20 NOTE — Assessment & Plan Note (Signed)
Patient has not had return of atrial flutter. Continue same therapy.

## 2011-11-17 ENCOUNTER — Ambulatory Visit (INDEPENDENT_AMBULATORY_CARE_PROVIDER_SITE_OTHER): Payer: Medicare Other

## 2011-11-17 DIAGNOSIS — I4892 Unspecified atrial flutter: Secondary | ICD-10-CM

## 2011-11-17 DIAGNOSIS — I358 Other nonrheumatic aortic valve disorders: Secondary | ICD-10-CM

## 2011-11-17 DIAGNOSIS — Z7901 Long term (current) use of anticoagulants: Secondary | ICD-10-CM

## 2011-11-17 DIAGNOSIS — I359 Nonrheumatic aortic valve disorder, unspecified: Secondary | ICD-10-CM

## 2011-12-22 ENCOUNTER — Ambulatory Visit (INDEPENDENT_AMBULATORY_CARE_PROVIDER_SITE_OTHER): Payer: Medicare Other | Admitting: Pharmacist

## 2011-12-22 DIAGNOSIS — Z7901 Long term (current) use of anticoagulants: Secondary | ICD-10-CM

## 2011-12-22 DIAGNOSIS — I4892 Unspecified atrial flutter: Secondary | ICD-10-CM

## 2011-12-22 DIAGNOSIS — I358 Other nonrheumatic aortic valve disorders: Secondary | ICD-10-CM

## 2011-12-22 DIAGNOSIS — I359 Nonrheumatic aortic valve disorder, unspecified: Secondary | ICD-10-CM

## 2011-12-22 LAB — POCT INR: INR: 2.3

## 2012-01-20 ENCOUNTER — Encounter: Payer: Self-pay | Admitting: Cardiology

## 2012-01-20 ENCOUNTER — Ambulatory Visit (INDEPENDENT_AMBULATORY_CARE_PROVIDER_SITE_OTHER): Payer: Medicare Other | Admitting: Cardiology

## 2012-01-20 ENCOUNTER — Ambulatory Visit (INDEPENDENT_AMBULATORY_CARE_PROVIDER_SITE_OTHER): Payer: Medicare Other | Admitting: *Deleted

## 2012-01-20 VITALS — BP 128/62 | HR 64 | Ht 61.0 in | Wt 118.0 lb

## 2012-01-20 DIAGNOSIS — Z7901 Long term (current) use of anticoagulants: Secondary | ICD-10-CM

## 2012-01-20 DIAGNOSIS — I4892 Unspecified atrial flutter: Secondary | ICD-10-CM

## 2012-01-20 DIAGNOSIS — I358 Other nonrheumatic aortic valve disorders: Secondary | ICD-10-CM

## 2012-01-20 DIAGNOSIS — I359 Nonrheumatic aortic valve disorder, unspecified: Secondary | ICD-10-CM

## 2012-01-20 DIAGNOSIS — R0602 Shortness of breath: Secondary | ICD-10-CM

## 2012-01-20 NOTE — Patient Instructions (Signed)
Your physician recommends that you schedule a follow-up appointment in: 6 months. The office will mail you a reminder letter 2 months prior appointment date. Your physician recommends that you continue on your current medications as directed. Please refer to the Current Medication list given to you today. 

## 2012-01-20 NOTE — Assessment & Plan Note (Signed)
Patient is continued on Coumadin.

## 2012-01-20 NOTE — Assessment & Plan Note (Signed)
The shortness of breath that he had was related to flutter and some volume overload. He has no more shortness of breath.

## 2012-01-20 NOTE — Assessment & Plan Note (Signed)
Patient is holding sinus rhythm. No change in therapy. 

## 2012-01-20 NOTE — Progress Notes (Signed)
    HPI Patient is seen today to followup atrial flutter. I saw him last October 20, 2011. I saw him initially in atrial flutter and we were planning for cardioversion. He converted spontaneously. He had some mild volume overload and was treated for a period of time with a diuretic. This was stopped in February, 2013. He has done well. He's not having palpitations. He is on Coumadin and diltiazem. He's not having significant shortness of breath.  No Known Allergies  Current Outpatient Prescriptions  Medication Sig Dispense Refill  . diltiazem (CARTIA XT) 240 MG 24 hr capsule Take 1 capsule (240 mg total) by mouth daily.  30 capsule  11  . Loperamide HCl (IMODIUM PO) Take by mouth as needed.        . warfarin (COUMADIN) 2.5 MG tablet AS DIRECTED./CY  99 tablet  0    History   Social History  . Marital Status: Married    Spouse Name: N/A    Number of Children: N/A  . Years of Education: N/A   Occupational History  . Not on file.   Social History Main Topics  . Smoking status: Never Smoker   . Smokeless tobacco: Not on file  . Alcohol Use: No  . Drug Use: No  . Sexually Active:    Other Topics Concern  . Not on file   Social History Narrative  . No narrative on file    No family history on file.  Past Medical History  Diagnosis Date  . Irritable bowel disease   . Difficulty in urination   . Hearing loss   . Glaucoma   . Arthritis   . Atrial flutter     New diagnosis November, 2012  . TSH (thyroid-stimulating hormone deficiency)     TSH 6.4,  November, 2012  . Shortness of breath     November, 2012  . Ejection fraction     EF 55-60%, echo, November, 2012 ( rapid atrial flutter at that time)  . Aortic insufficiency     Mild, echo, November, 2011  . Aortic valve sclerosis     Echo, November, 2011    Past Surgical History  Procedure Date  . Hernia repair 03/02/2011  . Back surgery 2008-2009  . Hand surgery 1984-85  . Leg surgery 1972    ROS Patient denies  fever, chills, headache, sweats, rash, change in vision, change in hearing, chest pain, cough, nausea vomiting. He does mention having to urinate 4-5 times in the night time.. All other systems are reviewed and are negative.  PHYSICAL EXAM  Patient is oriented to person time and place. His son is here with him.There is no jugular venous distention. Lungs are clear. Respiratory effort is nonlabored. Cardiac exam reveals S1 and S2. There no clicks or significant murmurs. The abdomen is soft. There is no peripheral edema.  Filed Vitals:   01/20/12 1138  BP: 128/62  Pulse: 64  Height: 5\' 1"  (1.549 m)  Weight: 118 lb (53.524 kg)     ASSESSMENT & PLAN

## 2012-01-26 ENCOUNTER — Other Ambulatory Visit: Payer: Self-pay | Admitting: *Deleted

## 2012-01-26 DIAGNOSIS — I4892 Unspecified atrial flutter: Secondary | ICD-10-CM

## 2012-01-26 MED ORDER — WARFARIN SODIUM 2.5 MG PO TABS: ORAL_TABLET | ORAL | Status: DC

## 2012-02-22 ENCOUNTER — Other Ambulatory Visit: Payer: Self-pay | Admitting: *Deleted

## 2012-02-22 ENCOUNTER — Other Ambulatory Visit: Payer: Self-pay

## 2012-02-22 DIAGNOSIS — I4892 Unspecified atrial flutter: Secondary | ICD-10-CM

## 2012-02-22 MED ORDER — WARFARIN SODIUM 2.5 MG PO TABS: ORAL_TABLET | ORAL | Status: DC

## 2012-02-22 MED ORDER — DILTIAZEM HCL ER COATED BEADS 240 MG PO CP24: 240.0000 mg | ORAL_CAPSULE | Freq: Every day | ORAL | Status: DC

## 2012-03-02 ENCOUNTER — Ambulatory Visit (INDEPENDENT_AMBULATORY_CARE_PROVIDER_SITE_OTHER): Payer: Medicare Other | Admitting: *Deleted

## 2012-03-02 DIAGNOSIS — Z7901 Long term (current) use of anticoagulants: Secondary | ICD-10-CM

## 2012-03-02 DIAGNOSIS — I358 Other nonrheumatic aortic valve disorders: Secondary | ICD-10-CM

## 2012-03-02 DIAGNOSIS — I359 Nonrheumatic aortic valve disorder, unspecified: Secondary | ICD-10-CM

## 2012-03-02 DIAGNOSIS — I4892 Unspecified atrial flutter: Secondary | ICD-10-CM

## 2012-04-13 ENCOUNTER — Ambulatory Visit (INDEPENDENT_AMBULATORY_CARE_PROVIDER_SITE_OTHER): Payer: Medicare Other | Admitting: *Deleted

## 2012-04-13 DIAGNOSIS — I359 Nonrheumatic aortic valve disorder, unspecified: Secondary | ICD-10-CM

## 2012-04-13 DIAGNOSIS — I358 Other nonrheumatic aortic valve disorders: Secondary | ICD-10-CM

## 2012-04-13 DIAGNOSIS — Z7901 Long term (current) use of anticoagulants: Secondary | ICD-10-CM

## 2012-04-13 DIAGNOSIS — I4892 Unspecified atrial flutter: Secondary | ICD-10-CM

## 2012-04-13 LAB — POCT INR: INR: 2.4

## 2012-05-25 ENCOUNTER — Ambulatory Visit (INDEPENDENT_AMBULATORY_CARE_PROVIDER_SITE_OTHER): Payer: Medicare Other | Admitting: *Deleted

## 2012-05-25 DIAGNOSIS — I4892 Unspecified atrial flutter: Secondary | ICD-10-CM

## 2012-05-25 DIAGNOSIS — Z7901 Long term (current) use of anticoagulants: Secondary | ICD-10-CM

## 2012-05-25 DIAGNOSIS — I358 Other nonrheumatic aortic valve disorders: Secondary | ICD-10-CM

## 2012-05-25 DIAGNOSIS — I359 Nonrheumatic aortic valve disorder, unspecified: Secondary | ICD-10-CM

## 2012-07-06 ENCOUNTER — Ambulatory Visit (INDEPENDENT_AMBULATORY_CARE_PROVIDER_SITE_OTHER): Payer: Medicare Other | Admitting: *Deleted

## 2012-07-06 DIAGNOSIS — Z7901 Long term (current) use of anticoagulants: Secondary | ICD-10-CM

## 2012-07-06 DIAGNOSIS — I358 Other nonrheumatic aortic valve disorders: Secondary | ICD-10-CM

## 2012-07-06 DIAGNOSIS — I359 Nonrheumatic aortic valve disorder, unspecified: Secondary | ICD-10-CM

## 2012-07-06 DIAGNOSIS — I4892 Unspecified atrial flutter: Secondary | ICD-10-CM

## 2012-07-21 ENCOUNTER — Ambulatory Visit (INDEPENDENT_AMBULATORY_CARE_PROVIDER_SITE_OTHER): Payer: Medicare Other | Admitting: Cardiology

## 2012-07-21 ENCOUNTER — Encounter: Payer: Self-pay | Admitting: Cardiology

## 2012-07-21 VITALS — BP 142/66 | HR 51 | Ht 64.0 in | Wt 114.0 lb

## 2012-07-21 DIAGNOSIS — I498 Other specified cardiac arrhythmias: Secondary | ICD-10-CM

## 2012-07-21 DIAGNOSIS — I359 Nonrheumatic aortic valve disorder, unspecified: Secondary | ICD-10-CM

## 2012-07-21 DIAGNOSIS — E038 Other specified hypothyroidism: Secondary | ICD-10-CM

## 2012-07-21 DIAGNOSIS — R001 Bradycardia, unspecified: Secondary | ICD-10-CM | POA: Insufficient documentation

## 2012-07-21 DIAGNOSIS — I4892 Unspecified atrial flutter: Secondary | ICD-10-CM

## 2012-07-21 DIAGNOSIS — Z7901 Long term (current) use of anticoagulants: Secondary | ICD-10-CM

## 2012-07-21 DIAGNOSIS — R0602 Shortness of breath: Secondary | ICD-10-CM

## 2012-07-21 DIAGNOSIS — I358 Other nonrheumatic aortic valve disorders: Secondary | ICD-10-CM

## 2012-07-21 LAB — TSH: TSH: 4.16 u[IU]/mL (ref 0.35–5.50)

## 2012-07-21 NOTE — Progress Notes (Signed)
HPI  Patient is seen today to followup atrial flutter. He is also seen to followup some mild volume overload. I saw him last May, 2013. He's doing very well. His wife says that he is doing too much including painting on a roof. I encouraged him to be very careful. He's not having chest pain or shortness of breath. He does not notice any palpitations.  No Known Allergies  Current Outpatient Prescriptions  Medication Sig Dispense Refill  . diltiazem (CARTIA XT) 240 MG 24 hr capsule Take 1 capsule (240 mg total) by mouth daily.  90 capsule  3  . Loperamide HCl (IMODIUM PO) Take by mouth as needed.        . warfarin (COUMADIN) 2.5 MG tablet Take as directed by coumadin clinic  90 tablet  1    History   Social History  . Marital Status: Married    Spouse Name: N/A    Number of Children: N/A  . Years of Education: N/A   Occupational History  . Not on file.   Social History Main Topics  . Smoking status: Never Smoker   . Smokeless tobacco: Not on file  . Alcohol Use: No  . Drug Use: No  . Sexually Active:    Other Topics Concern  . Not on file   Social History Narrative  . No narrative on file    No family history on file.  Past Medical History  Diagnosis Date  . Irritable bowel disease   . Difficulty in urination   . Hearing loss   . Glaucoma(365)   . Arthritis   . Atrial flutter     New diagnosis November, 2012  . TSH (thyroid-stimulating hormone deficiency)     TSH 6.4,  November, 2012  . Shortness of breath     November, 2012  . Ejection fraction     EF 55-60%, echo, November, 2012 ( rapid atrial flutter at that time)  . Aortic insufficiency     Mild, echo, November, 2011  . Aortic valve sclerosis     Echo, November, 2011    Past Surgical History  Procedure Date  . Hernia repair 03/02/2011  . Back surgery 2008-2009  . Hand surgery 1984-85  . Leg surgery 1972    Patient Active Problem List  Diagnosis  . Inguinal hernia - left s/p lap LIH repair  [id number]  . Atrial flutter  . TSH (thyroid-stimulating hormone deficiency)  . Hearing loss  . Shortness of breath  . Aortic valve sclerosis  . Aortic insufficiency  . Long term current use of anticoagulant    ROS   Patient denies fever, chills, headache, sweats, rash, change in vision, change in hearing, chest pain, cough, nausea or vomiting, urinary symptoms. All other systems are reviewed and are negative.  PHYSICAL EXAM  The patient is oriented to person time and place. Affect is normal. He is here with his wife. Lungs are clear. Respiratory effort is nonlabored. There is no jugular venous distention. There are no carotid bruits. Lungs are clear. Respiratory effort is nonlabored. Cardiac exam reveals S1 and S2. There are no clicks. I do not hear any significant aortic insufficiency. The abdomen is soft. There is no peripheral edema. There are no musculoskeletal deformities. There are no skin rashes.  Filed Vitals:   07/21/12 0852  BP: 142/66  Pulse: 51  Height: 5\' 4"  (1.626 m)  Weight: 114 lb (51.71 kg)   EKG is done today and reviewed by me. There is  sinus rhythm with sinus bradycardia. There is no change in the QRS.  ASSESSMENT & PLAN

## 2012-07-21 NOTE — Assessment & Plan Note (Signed)
The patient's TSH was elevated at 6.4 he November, 2012. There was no need for treatment at that time. It is appropriate to repeat the TSH at this time. This will be done today.

## 2012-07-21 NOTE — Assessment & Plan Note (Signed)
By echo in 2011 he had some aortic valve sclerosis and mild aortic insufficiency. At this time there is no need for follow up echo.

## 2012-07-21 NOTE — Patient Instructions (Addendum)
Your physician recommends that you return for lab work in: today (tsh)  Your physician wants you to follow-up in: 9 months. You will receive a reminder letter in the mail two months in advance. If you don't receive a letter, please call our office to schedule the follow-up appointment.

## 2012-07-21 NOTE — Assessment & Plan Note (Signed)
The patient had atrial flutter in November, 2012. He spontaneously converted to sinus over time. He's not had any recurrent clinical episodes. He is coumadinized. He is on diltiazem to help with rate control as needed. No further workup at this time.

## 2012-07-21 NOTE — Assessment & Plan Note (Signed)
The patient has sinus bradycardia. This has been noted in the past. It is asymptomatic. He is not on a beta blocker. No further workup is needed at this time.

## 2012-07-21 NOTE — Assessment & Plan Note (Signed)
He continues on Coumadin at this time. No change in therapy.

## 2012-07-21 NOTE — Assessment & Plan Note (Signed)
He had had some shortness of breath around the time of his atrial flutter. There was a mild volume component to it. After he converted to sinus and his volume was stabilize he's had no further problems. No further workup.

## 2012-08-15 ENCOUNTER — Telehealth: Payer: Self-pay | Admitting: Pharmacist

## 2012-08-15 ENCOUNTER — Other Ambulatory Visit: Payer: Self-pay | Admitting: *Deleted

## 2012-08-15 DIAGNOSIS — I4892 Unspecified atrial flutter: Secondary | ICD-10-CM

## 2012-08-15 MED ORDER — WARFARIN SODIUM 2.5 MG PO TABS: ORAL_TABLET | ORAL | Status: DC

## 2012-08-15 NOTE — Telephone Encounter (Signed)
Pt's wife called in requesting refill on pt's Coumadin to [facility].

## 2012-08-17 ENCOUNTER — Ambulatory Visit (INDEPENDENT_AMBULATORY_CARE_PROVIDER_SITE_OTHER): Payer: Medicare Other | Admitting: *Deleted

## 2012-08-17 DIAGNOSIS — I359 Nonrheumatic aortic valve disorder, unspecified: Secondary | ICD-10-CM

## 2012-08-17 DIAGNOSIS — Z7901 Long term (current) use of anticoagulants: Secondary | ICD-10-CM

## 2012-08-17 DIAGNOSIS — I4892 Unspecified atrial flutter: Secondary | ICD-10-CM

## 2012-08-17 DIAGNOSIS — I358 Other nonrheumatic aortic valve disorders: Secondary | ICD-10-CM

## 2012-08-17 LAB — POCT INR: INR: 2.2

## 2012-09-29 ENCOUNTER — Ambulatory Visit (INDEPENDENT_AMBULATORY_CARE_PROVIDER_SITE_OTHER): Payer: Medicare Other | Admitting: *Deleted

## 2012-09-29 DIAGNOSIS — I359 Nonrheumatic aortic valve disorder, unspecified: Secondary | ICD-10-CM

## 2012-09-29 DIAGNOSIS — I4892 Unspecified atrial flutter: Secondary | ICD-10-CM

## 2012-09-29 DIAGNOSIS — I358 Other nonrheumatic aortic valve disorders: Secondary | ICD-10-CM

## 2012-09-29 DIAGNOSIS — Z7901 Long term (current) use of anticoagulants: Secondary | ICD-10-CM

## 2012-09-29 LAB — POCT INR: INR: 2.1

## 2012-10-21 ENCOUNTER — Other Ambulatory Visit: Payer: Self-pay

## 2012-10-24 MED ORDER — WARFARIN SODIUM 2.5 MG PO TABS: ORAL_TABLET | ORAL | Status: DC

## 2012-11-10 ENCOUNTER — Ambulatory Visit (INDEPENDENT_AMBULATORY_CARE_PROVIDER_SITE_OTHER): Payer: Medicare Other | Admitting: *Deleted

## 2012-11-10 DIAGNOSIS — I4892 Unspecified atrial flutter: Secondary | ICD-10-CM

## 2012-11-10 DIAGNOSIS — Z7901 Long term (current) use of anticoagulants: Secondary | ICD-10-CM

## 2012-12-22 ENCOUNTER — Ambulatory Visit (INDEPENDENT_AMBULATORY_CARE_PROVIDER_SITE_OTHER): Payer: Medicare Other | Admitting: *Deleted

## 2012-12-22 DIAGNOSIS — I358 Other nonrheumatic aortic valve disorders: Secondary | ICD-10-CM

## 2012-12-22 DIAGNOSIS — I4892 Unspecified atrial flutter: Secondary | ICD-10-CM

## 2012-12-22 DIAGNOSIS — Z7901 Long term (current) use of anticoagulants: Secondary | ICD-10-CM

## 2012-12-22 DIAGNOSIS — I359 Nonrheumatic aortic valve disorder, unspecified: Secondary | ICD-10-CM

## 2012-12-22 LAB — POCT INR: INR: 2.5

## 2013-02-02 ENCOUNTER — Ambulatory Visit (INDEPENDENT_AMBULATORY_CARE_PROVIDER_SITE_OTHER): Payer: Medicare Other

## 2013-02-02 DIAGNOSIS — I359 Nonrheumatic aortic valve disorder, unspecified: Secondary | ICD-10-CM

## 2013-02-02 DIAGNOSIS — Z7901 Long term (current) use of anticoagulants: Secondary | ICD-10-CM

## 2013-02-02 DIAGNOSIS — I358 Other nonrheumatic aortic valve disorders: Secondary | ICD-10-CM

## 2013-02-02 DIAGNOSIS — I4892 Unspecified atrial flutter: Secondary | ICD-10-CM

## 2013-03-16 ENCOUNTER — Ambulatory Visit (INDEPENDENT_AMBULATORY_CARE_PROVIDER_SITE_OTHER): Payer: Medicare Other | Admitting: *Deleted

## 2013-03-16 DIAGNOSIS — I4892 Unspecified atrial flutter: Secondary | ICD-10-CM

## 2013-03-16 DIAGNOSIS — I359 Nonrheumatic aortic valve disorder, unspecified: Secondary | ICD-10-CM

## 2013-03-16 DIAGNOSIS — I358 Other nonrheumatic aortic valve disorders: Secondary | ICD-10-CM

## 2013-03-16 DIAGNOSIS — Z7901 Long term (current) use of anticoagulants: Secondary | ICD-10-CM

## 2013-04-07 ENCOUNTER — Other Ambulatory Visit: Payer: Self-pay | Admitting: Cardiology

## 2013-04-07 ENCOUNTER — Other Ambulatory Visit: Payer: Self-pay

## 2013-04-07 DIAGNOSIS — I4892 Unspecified atrial flutter: Secondary | ICD-10-CM

## 2013-04-07 MED ORDER — WARFARIN SODIUM 2.5 MG PO TABS: ORAL_TABLET | ORAL | Status: DC

## 2013-04-27 ENCOUNTER — Ambulatory Visit (INDEPENDENT_AMBULATORY_CARE_PROVIDER_SITE_OTHER): Payer: Medicare Other | Admitting: *Deleted

## 2013-04-27 DIAGNOSIS — Z7901 Long term (current) use of anticoagulants: Secondary | ICD-10-CM

## 2013-04-27 DIAGNOSIS — I358 Other nonrheumatic aortic valve disorders: Secondary | ICD-10-CM

## 2013-04-27 DIAGNOSIS — I4892 Unspecified atrial flutter: Secondary | ICD-10-CM

## 2013-04-27 DIAGNOSIS — I359 Nonrheumatic aortic valve disorder, unspecified: Secondary | ICD-10-CM

## 2013-06-08 ENCOUNTER — Ambulatory Visit (INDEPENDENT_AMBULATORY_CARE_PROVIDER_SITE_OTHER): Payer: Medicare Other | Admitting: Pharmacist

## 2013-06-08 DIAGNOSIS — I359 Nonrheumatic aortic valve disorder, unspecified: Secondary | ICD-10-CM

## 2013-06-08 DIAGNOSIS — Z7901 Long term (current) use of anticoagulants: Secondary | ICD-10-CM

## 2013-06-08 DIAGNOSIS — I4892 Unspecified atrial flutter: Secondary | ICD-10-CM

## 2013-06-08 DIAGNOSIS — I358 Other nonrheumatic aortic valve disorders: Secondary | ICD-10-CM

## 2013-06-08 LAB — POCT INR: INR: 2.3

## 2013-07-19 ENCOUNTER — Other Ambulatory Visit: Payer: Self-pay | Admitting: Cardiology

## 2013-07-20 ENCOUNTER — Other Ambulatory Visit: Payer: Self-pay

## 2013-07-20 ENCOUNTER — Other Ambulatory Visit: Payer: Self-pay | Admitting: *Deleted

## 2013-07-20 DIAGNOSIS — I4892 Unspecified atrial flutter: Secondary | ICD-10-CM

## 2013-07-20 MED ORDER — WARFARIN SODIUM 2.5 MG PO TABS: ORAL_TABLET | ORAL | Status: DC

## 2013-07-25 ENCOUNTER — Encounter: Payer: Self-pay | Admitting: Cardiology

## 2013-07-25 ENCOUNTER — Ambulatory Visit (INDEPENDENT_AMBULATORY_CARE_PROVIDER_SITE_OTHER): Payer: Medicare Other | Admitting: General Practice

## 2013-07-25 ENCOUNTER — Ambulatory Visit (INDEPENDENT_AMBULATORY_CARE_PROVIDER_SITE_OTHER): Payer: Medicare Other | Admitting: Cardiology

## 2013-07-25 VITALS — BP 122/60 | HR 53 | Ht 61.0 in | Wt 110.0 lb

## 2013-07-25 DIAGNOSIS — Z7901 Long term (current) use of anticoagulants: Secondary | ICD-10-CM

## 2013-07-25 DIAGNOSIS — I359 Nonrheumatic aortic valve disorder, unspecified: Secondary | ICD-10-CM

## 2013-07-25 DIAGNOSIS — I4892 Unspecified atrial flutter: Secondary | ICD-10-CM

## 2013-07-25 DIAGNOSIS — I358 Other nonrheumatic aortic valve disorders: Secondary | ICD-10-CM

## 2013-07-25 DIAGNOSIS — E038 Other specified hypothyroidism: Secondary | ICD-10-CM

## 2013-07-25 LAB — POCT INR: INR: 2

## 2013-07-25 NOTE — Assessment & Plan Note (Signed)
He has not had any significant return of atrial flutter since 2012. Cardizem is continued for rate control if he converts back to flutter.

## 2013-07-25 NOTE — Assessment & Plan Note (Signed)
He has aortic valve sclerosis. The murmur is not significant at this time. I feel he does not need a followup echo at this time.

## 2013-07-25 NOTE — Assessment & Plan Note (Signed)
We checked his TSH last year and it was normal.

## 2013-07-25 NOTE — Assessment & Plan Note (Signed)
Patient continues on Coumadin and does well. I'll see him back in one year.

## 2013-07-25 NOTE — Progress Notes (Signed)
HPI  Patient is seen today to followup a history of atrial flutter. He actually is doing very well. He is not having any significant palpitations. His volume status is stable. We had checked his TSH after I saw him last year and it was normal. He does have a primary care physician. He's had a biopsy on the back of his leg. There is a continued scar that has been present long time. I encouraged his wife to be back in touch with his primary physician if she is concerned about this. The area does not appear to be infected.  No Known Allergies  Current Outpatient Prescriptions  Medication Sig Dispense Refill  . diltiazem (CARDIZEM CD) 240 MG 24 hr capsule TAKE 1 CAPSULE BY MOUTH ONCE A DAY  90 capsule  2  . Loperamide HCl (IMODIUM PO) Take by mouth as needed.        . warfarin (COUMADIN) 2.5 MG tablet Take as directed by coumadin clinic  180 tablet  0   No current facility-administered medications for this visit.    History   Social History  . Marital Status: Married    Spouse Name: N/A    Number of Children: N/A  . Years of Education: N/A   Occupational History  . Not on file.   Social History Main Topics  . Smoking status: Never Smoker   . Smokeless tobacco: Not on file  . Alcohol Use: No  . Drug Use: No  . Sexual Activity:    Other Topics Concern  . Not on file   Social History Narrative  . No narrative on file    History reviewed. No pertinent family history.  Past Medical History  Diagnosis Date  . Irritable bowel disease   . Difficulty in urination   . Hearing loss   . Glaucoma   . Arthritis   . Atrial flutter     New diagnosis November, 2012  . TSH (thyroid-stimulating hormone deficiency)     TSH 6.4,  November, 2012  . Shortness of breath     November, 2012  . Ejection fraction     EF 55-60%, echo, November, 2012 ( rapid atrial flutter at that time)  . Aortic insufficiency     Mild, echo, November, 2011  . Aortic valve sclerosis     Echo, November,  2011  . Sinus bradycardia     Past Surgical History  Procedure Laterality Date  . Hernia repair  03/02/2011  . Back surgery  2008-2009  . Hand surgery  1984-85  . Leg surgery  1972    Patient Active Problem List   Diagnosis Date Noted  . Sinus bradycardia   . Long term current use of anticoagulant 08/04/2011  . Aortic valve sclerosis   . Aortic insufficiency   . Atrial flutter   . TSH (thyroid-stimulating hormone deficiency)   . Hearing loss   . Shortness of breath   . Inguinal hernia - left s/p lap [facility] repair [id number] 03/16/2011    ROS   Patient denies fever, chills, headache, sweats, rash, change in vision, change in hearing, chest pain, cough, nausea or vomiting, urinary symptoms. All other systems are reviewed and are negative.  PHYSICAL EXAM  Patient is here with his wife. He is stable. He is oriented to person time and place. Affect is normal. There is no jugulovenous distention. Lungs are clear. Respiratory effort is nonlabored. Cardiac exam reveals S1 and S2. There no clicks or significant murmurs. Abdomen is  soft. There is no peripheral edema.  Filed Vitals:   07/25/13 1043  BP: 122/60  Pulse: 53  Height: 5\' 1"  (1.549 m)  Weight: 110 lb (49.896 kg)   EKG is done today and reviewed by me. There is sinus rhythm. There is mild sinus bradycardia.  ASSESSMENT & PLAN

## 2013-07-25 NOTE — Patient Instructions (Signed)
**Note De-identified  Obfuscation** Your physician recommends that you continue on your current medications as directed. Please refer to the Current Medication list given to you today.  Your physician wants you to follow-up in: 1 year. You will receive a reminder letter in the mail two months in advance. If you don't receive a letter, please call our office to schedule the follow-up appointment.  

## 2013-09-05 ENCOUNTER — Ambulatory Visit (INDEPENDENT_AMBULATORY_CARE_PROVIDER_SITE_OTHER): Payer: Medicare Other | Admitting: *Deleted

## 2013-09-05 DIAGNOSIS — Z7901 Long term (current) use of anticoagulants: Secondary | ICD-10-CM

## 2013-09-05 DIAGNOSIS — I4892 Unspecified atrial flutter: Secondary | ICD-10-CM

## 2013-09-05 DIAGNOSIS — I359 Nonrheumatic aortic valve disorder, unspecified: Secondary | ICD-10-CM

## 2013-09-05 DIAGNOSIS — I358 Other nonrheumatic aortic valve disorders: Secondary | ICD-10-CM

## 2013-09-05 LAB — POCT INR: INR: 1.9

## 2013-10-16 ENCOUNTER — Ambulatory Visit (INDEPENDENT_AMBULATORY_CARE_PROVIDER_SITE_OTHER): Payer: Medicare Other | Admitting: *Deleted

## 2013-10-16 DIAGNOSIS — I358 Other nonrheumatic aortic valve disorders: Secondary | ICD-10-CM

## 2013-10-16 DIAGNOSIS — Z7901 Long term (current) use of anticoagulants: Secondary | ICD-10-CM

## 2013-10-16 DIAGNOSIS — I359 Nonrheumatic aortic valve disorder, unspecified: Secondary | ICD-10-CM

## 2013-10-16 DIAGNOSIS — I4892 Unspecified atrial flutter: Secondary | ICD-10-CM

## 2013-10-16 LAB — POCT INR: INR: 2.9

## 2013-10-23 ENCOUNTER — Other Ambulatory Visit: Payer: Self-pay | Admitting: *Deleted

## 2013-10-23 DIAGNOSIS — I4892 Unspecified atrial flutter: Secondary | ICD-10-CM

## 2013-10-23 MED ORDER — WARFARIN SODIUM 2.5 MG PO TABS
ORAL_TABLET | ORAL | Status: DC
Start: 2013-10-23 — End: 2014-02-15

## 2013-10-23 MED ORDER — DILTIAZEM HCL ER COATED BEADS 240 MG PO CP24: ORAL_CAPSULE | ORAL | Status: DC

## 2013-11-27 ENCOUNTER — Ambulatory Visit (INDEPENDENT_AMBULATORY_CARE_PROVIDER_SITE_OTHER): Payer: Medicare Other | Admitting: *Deleted

## 2013-11-27 DIAGNOSIS — I4892 Unspecified atrial flutter: Secondary | ICD-10-CM

## 2013-11-27 DIAGNOSIS — I359 Nonrheumatic aortic valve disorder, unspecified: Secondary | ICD-10-CM

## 2013-11-27 DIAGNOSIS — I358 Other nonrheumatic aortic valve disorders: Secondary | ICD-10-CM

## 2013-11-27 DIAGNOSIS — Z7901 Long term (current) use of anticoagulants: Secondary | ICD-10-CM

## 2013-11-27 LAB — POCT INR: INR: 2.9

## 2013-12-18 ENCOUNTER — Telehealth: Payer: Self-pay | Admitting: Cardiology

## 2013-12-18 NOTE — Telephone Encounter (Signed)
New problem    Pt's wife called with some questions about pt's medications.   Please give her a call back.

## 2013-12-18 NOTE — Telephone Encounter (Signed)
Patient has been on prednisone since last Monday, and wanted to know if this would effect protime.  I notified wife that prednisone can elevated INR and would like to see patient early this week for repeat INR.  She was agreeable, and appointment made for 12/20/13 with coumadin clinic.  Appointment made.

## 2013-12-20 ENCOUNTER — Ambulatory Visit (INDEPENDENT_AMBULATORY_CARE_PROVIDER_SITE_OTHER): Payer: Medicare Other | Admitting: *Deleted

## 2013-12-20 DIAGNOSIS — I4892 Unspecified atrial flutter: Secondary | ICD-10-CM

## 2013-12-20 DIAGNOSIS — I358 Other nonrheumatic aortic valve disorders: Secondary | ICD-10-CM

## 2013-12-20 DIAGNOSIS — Z7901 Long term (current) use of anticoagulants: Secondary | ICD-10-CM

## 2013-12-20 DIAGNOSIS — I359 Nonrheumatic aortic valve disorder, unspecified: Secondary | ICD-10-CM

## 2013-12-20 LAB — POCT INR: INR: 4.9

## 2014-02-15 ENCOUNTER — Ambulatory Visit (INDEPENDENT_AMBULATORY_CARE_PROVIDER_SITE_OTHER): Payer: Medicare Other | Admitting: Family Medicine

## 2014-02-15 ENCOUNTER — Ambulatory Visit (INDEPENDENT_AMBULATORY_CARE_PROVIDER_SITE_OTHER): Payer: Medicare Other

## 2014-02-15 VITALS — BP 100/50 | HR 63 | Temp 99.4°F | Resp 16 | Ht 63.0 in | Wt 109.0 lb

## 2014-02-15 DIAGNOSIS — R5381 Other malaise: Secondary | ICD-10-CM

## 2014-02-15 DIAGNOSIS — R059 Cough, unspecified: Secondary | ICD-10-CM

## 2014-02-15 DIAGNOSIS — R5383 Other fatigue: Secondary | ICD-10-CM

## 2014-02-15 DIAGNOSIS — R05 Cough: Secondary | ICD-10-CM

## 2014-02-15 DIAGNOSIS — I359 Nonrheumatic aortic valve disorder, unspecified: Secondary | ICD-10-CM

## 2014-02-15 DIAGNOSIS — R509 Fever, unspecified: Secondary | ICD-10-CM

## 2014-02-15 DIAGNOSIS — J209 Acute bronchitis, unspecified: Secondary | ICD-10-CM

## 2014-02-15 DIAGNOSIS — I350 Nonrheumatic aortic (valve) stenosis: Secondary | ICD-10-CM

## 2014-02-15 DIAGNOSIS — Z7901 Long term (current) use of anticoagulants: Secondary | ICD-10-CM

## 2014-02-15 LAB — POCT CBC
Granulocyte percent: 68 %G (ref 37–80)
HCT, POC: 37.6 % — AB (ref 43.5–53.7)
Hemoglobin: 11.7 g/dL — AB (ref 14.1–18.1)
Lymph, poc: 0.7 (ref 0.6–3.4)
MCH, POC: 31.2 pg (ref 27–31.2)
MCHC: 31.1 g/dL — AB (ref 31.8–35.4)
MCV: 100.3 fL — AB (ref 80–97)
MID (cbc): 0.5 (ref 0–0.9)
MPV: 9.3 fL (ref 0–99.8)
POC Granulocyte: 2.7 (ref 2–6.9)
POC LYMPH PERCENT: 18.6 % (ref 10–50)
POC MID %: 13.4 % — AB (ref 0–12)
Platelet Count, POC: 170 10*3/uL (ref 142–424)
RBC: 3.75 M/uL — AB (ref 4.69–6.13)
RDW, POC: 13.1 %
WBC: 3.9 10*3/uL — AB (ref 4.6–10.2)

## 2014-02-15 MED ORDER — AMOXICILLIN 500 MG PO CAPS: 500.0000 mg | ORAL_CAPSULE | Freq: Two times a day (BID) | ORAL | Status: DC

## 2014-02-15 MED ORDER — BENZONATATE 100 MG PO CAPS: 200.0000 mg | ORAL_CAPSULE | Freq: Two times a day (BID) | ORAL | Status: DC | PRN

## 2014-02-15 NOTE — Progress Notes (Signed)
Chief Complaint:  Chief Complaint  Patient presents with  . Cough    x 4 days    HPI: Patrick Walker is a 80 y.o. male who is here for  4 day hx of cough, yellow sputum, with some red tinge to it  When he coughs too much.  He is on a blood thinner. He does not remember what his last INR is,, he takes it  for paroxysmal aflutter.  He had subjective fevers. He has been taking Robitussin and tylenol and cold tablet for heart patients without relief.   He has not had any n/v/abd pain/worsening diarrhea or bloody stools or urine or bleeding.  His wife is with him, she states that he was in in his usual state of health and started coughing, it sounds terrible He is not around sick contacts. Denies any CP or SOB or leg swelling.  He is a vegetariana nd eats a lot of beans but wife is worried he is losing weight. HE was eating well and drinking well up to 4 days ago.    Past Medical History  Diagnosis Date  . Irritable bowel disease   . Difficulty in urination   . Hearing loss   . Glaucoma   . Arthritis   . Atrial flutter     New diagnosis November, 2012  . TSH (thyroid-stimulating hormone deficiency)     TSH 6.4,  November, 2012  . Shortness of breath     November, 2012  . Ejection fraction     EF 55-60%, echo, November, 2012 ( rapid atrial flutter at that time)  . Aortic insufficiency     Mild, echo, November, 2011  . Aortic valve sclerosis     Echo, November, 2011  . Sinus bradycardia    Past Surgical History  Procedure Laterality Date  . Hernia repair  03/02/2011  . Back surgery  2008-2009  . Hand surgery  1984-85  . Leg surgery  1972   History   Social History  . Marital Status: Married    Spouse Name: N/A    Number of Children: N/A  . Years of Education: N/A   Social History Main Topics  . Smoking status: Never Smoker   . Smokeless tobacco: None  . Alcohol Use: No  . Drug Use: No  . Sexual Activity:    Other Topics Concern  . None   Social History  Narrative  . None   No family history on file. No Known Allergies Prior to Admission medications   Medication Sig Start Date End Date Taking? Authorizing Provider  diltiazem (CARDIZEM CD) 240 MG 24 hr capsule TAKE 1 CAPSULE BY MOUTH ONCE A DAY 10/23/13   Patrick Bjornstad, MD  warfarin (COUMADIN) 2.5 MG tablet Take as directed by coumadin clinic 1 tab M, W, F 2 tab every other day 10/23/13   Patrick Bjornstad, MD     ROS: The patient denies night sweats, unintentional weight loss, chest pain, palpitations, wheezing, dyspnea on exertion, nausea, vomiting, abdominal pain, dysuria, hematuria, melena, numbness, weakness, or tingling.   All other systems have been reviewed and were otherwise negative with the exception of those mentioned in the HPI and as above.    PHYSICAL EXAM: Filed Vitals:   02/15/14 1750  BP: 100/50  Pulse: 63  Temp: 99.4 F (37.4 C)  Resp: 16   Filed Vitals:   02/15/14 1750  Height: 5\' 3"  (1.6 m)  Weight: 109 lb (49.442  kg)   Body mass index is 19.31 kg/(m^2).  General: Alert, no acute distress HEENT:  Normocephalic, atraumatic, oropharynx patent. EOMI, PERRLA. TM normal Cardiovascular:  Regular rate and rhythm, no rubs + 3/6 systolic murmur , no gallops.  No Carotid bruits, radial pulse intact. No pedal edema.  Respiratory: Clear to auscultation bilaterally.  No wheezes, rales, or rhonchi.  No cyanosis, no use of accessory musculature GI: No organomegaly, abdomen is soft and non-tender, positive bowel sounds.  No masses. Skin: No rashes. Neurologic: Facial musculature symmetric. Psychiatric: Patient is appropriate throughout our interaction. Lymphatic: No cervical lymphadenopathy Musculoskeletal: Gait intact.   LABS: Results for orders placed in visit on 02/15/14  POCT CBC      Result Value Ref Range   WBC 3.9 (*) 4.6 - 10.2 K/uL   Lymph, poc 0.7  0.6 - 3.4   POC LYMPH PERCENT 18.6  10 - 50 %L   MID (cbc) 0.5  0 - 0.9   POC MID % 13.4 (*) 0 - 12 %M    POC Granulocyte 2.7  2 - 6.9   Granulocyte percent 68.0  37 - 80 %G   RBC 3.75 (*) 4.69 - 6.13 M/uL   Hemoglobin 11.7 (*) 14.1 - 18.1 g/dL   HCT, POC 37.6 (*) 43.5 - 53.7 %   MCV 100.3 (*) 80 - 97 fL   MCH, POC 31.2  27 - 31.2 pg   MCHC 31.1 (*) 31.8 - 35.4 g/dL   RDW, POC 13.1     Platelet Count, POC 170  142 - 424 K/uL   MPV 9.3  0 - 99.8 fL     EKG/XRAY:   Primary read interpreted by Dr. Marin Walker at [facility]. Multiple nodular opacities, unchanged from prior chest xray No acute cardiopulmonary process   ASSESSMENT/PLAN: Encounter Diagnoses  Name Primary?  . Cough Yes  . Fever, unspecified   . Other malaise and fatigue   . Chronic anticoagulation   . Aortic stenosis    Very pleasant 80 y/o man who is here with hs equally elderly 64 y/o wife , both are terrible historians with 4 day h/o cough sxs and subjective fevers.  Most likley bronchitis. I will be conservative about abx use since he is on blood thinner and we do not know what his INR is, he has had his INR with Dr Patrick Walker since his last one in epic and it is within 2-3.  Chest xray show pulmonary nodules that appear to be stable from portable chest xray that we have from 06/22/2009 Will rx amoxacillin BID and also tessalon perles Encourage to drink boost or ensure for caloric supplement prn HGb is stable at 12. 3 3 years ago F/u with labs   Gross sideeffects, risk and benefits, and alternatives of medications d/w patient. Patient is aware that all medications have potential sideeffects and we are unable to predict every sideeffect or drug-drug interaction that may occur.  Patrick Walker, [place], DO 02/15/2014 7:26 PM

## 2014-02-15 NOTE — Patient Instructions (Signed)
Acute Bronchitis °Bronchitis is inflammation of the airways that extend from the windpipe into the lungs (bronchi). The inflammation often causes mucus to develop. This leads to a cough, which is the most common symptom of bronchitis.  °In acute bronchitis, the condition usually develops suddenly and goes away over time, usually in a couple weeks. Smoking, allergies, and asthma can make bronchitis worse. Repeated episodes of bronchitis may cause further lung problems.  °CAUSES °Acute bronchitis is most often caused by the same virus that causes a cold. The virus can spread from person to person (contagious).  °SIGNS AND SYMPTOMS  °· Cough.   °· Fever.   °· Coughing up mucus.   °· Body aches.   °· Chest congestion.   °· Chills.   °· Shortness of breath.   °· Sore throat.   °DIAGNOSIS  °Acute bronchitis is usually diagnosed through a physical exam. Tests, such as chest X-rays, are sometimes done to rule out other conditions.  °TREATMENT  °Acute bronchitis usually goes away in a couple weeks. Often times, no medical treatment is necessary. Medicines are sometimes given for relief of fever or cough. Antibiotics are usually not needed but may be prescribed in certain situations. In some cases, an inhaler may be recommended to help reduce shortness of breath and control the cough. A cool mist vaporizer may also be used to help thin bronchial secretions and make it easier to clear the chest.  °HOME CARE INSTRUCTIONS °· Get plenty of rest.   °· Drink enough fluids to keep your urine clear or pale yellow (unless you have a medical condition that requires fluid restriction). Increasing fluids may help thin your secretions and will prevent dehydration.   °· Only take over-the-counter or prescription medicines as directed by your health care Patrick Walker.   °· Avoid smoking and secondhand smoke. Exposure to cigarette smoke or irritating chemicals will make bronchitis worse. If you are a smoker, consider using nicotine gum or skin  patches to help control withdrawal symptoms. Quitting smoking will help your lungs heal faster.   °· Reduce the chances of another bout of acute bronchitis by washing your hands frequently, avoiding people with cold symptoms, and trying not to touch your hands to your mouth, nose, or eyes.   °· Follow up with your health care Patrick Walker as directed.   °SEEK MEDICAL CARE IF: °Your symptoms do not improve after 1 week of treatment.  °SEEK IMMEDIATE MEDICAL CARE IF: °· You develop an increased fever or chills.   °· You have chest pain.   °· You have severe shortness of breath. °· You have bloody sputum.   °· You develop dehydration. °· You develop fainting. °· You develop repeated vomiting. °· You develop a severe headache. °MAKE SURE YOU:  °· Understand these instructions. °· Will watch your condition. °· Will get help right away if you are not doing well or get worse. °Document Released: 09/24/2004 Document Revised: 04/19/2013 Document Reviewed: 02/07/2013 °[employer]® Patient Information ©2015 [employer]. This information is not intended to replace advice given to you by your health care Patrick Walker. Make sure you discuss any questions you have with your health care Patrick Walker. ° °

## 2014-02-16 LAB — COMPREHENSIVE METABOLIC PANEL
ALT: 9 U/L (ref 0–53)
AST: 19 U/L (ref 0–37)
Albumin: 4.1 g/dL (ref 3.5–5.2)
Alkaline Phosphatase: 61 U/L (ref 39–117)
Calcium: 8.8 mg/dL (ref 8.4–10.5)
Chloride: 101 mEq/L (ref 96–112)
Potassium: 4.3 mEq/L (ref 3.5–5.3)
Sodium: 137 mEq/L (ref 135–145)
Total Bilirubin: 0.5 mg/dL (ref 0.2–1.2)
Total Protein: 7.3 g/dL (ref 6.0–8.3)

## 2014-02-16 LAB — PROTIME-INR
INR: 2.79 — ABNORMAL HIGH (ref <1.50)
Prothrombin Time: 28.7 s — ABNORMAL HIGH (ref 11.6–15.2)

## 2014-02-16 LAB — COMPREHENSIVE METABOLIC PANEL WITH GFR
BUN: 21 mg/dL (ref 6–23)
CO2: 27 meq/L (ref 19–32)
Creat: 1.16 mg/dL (ref 0.50–1.35)
Glucose, Bld: 96 mg/dL (ref 70–99)

## 2014-02-20 ENCOUNTER — Telehealth: Payer: Self-pay | Admitting: *Deleted

## 2014-02-20 NOTE — Telephone Encounter (Signed)
Pt's wife calling for results from lab work. Please advise?

## 2014-02-20 NOTE — Telephone Encounter (Signed)
Message copied by Constance Goltz on Tue Feb 20, 2014  2:00 PM ------      Message from: Glenford Bayley      Created: Tue Feb 20, 2014 10:58 AM       His INR is within range 2-3 . He needs to go get it rechecked since he has been on antibiotics. How is he doing?  ------

## 2014-02-20 NOTE — Telephone Encounter (Signed)
Pt is not any better, still coughing. Not much improvement. Please advise.  He's going to coumadin clinic tomorrow.

## 2014-02-21 NOTE — Telephone Encounter (Signed)
LM on VM re labs and also recheck if he is not feeling better especially if cough or SOB, fevers, chills.

## 2014-05-18 ENCOUNTER — Telehealth: Payer: Self-pay | Admitting: Cardiology

## 2014-05-18 NOTE — Telephone Encounter (Signed)
New message     Can patient take preservision mineral eye supplement?----it is a pill for his eyes----it is over the counter.  The eye doctor asked pt to call doctor before taking it

## 2014-05-21 NOTE — Telephone Encounter (Signed)
Please advise 

## 2014-05-23 NOTE — Telephone Encounter (Signed)
OK to take supplement for eyes.

## 2014-05-24 NOTE — Telephone Encounter (Signed)
[place], the pts wife, is advised and she verbalized understanding. I also advised her to ask the pts PCP if it is okay for the pt to take preservision mineral eye supplement as she is concerned about the pts stomach, she verbalized understanding.

## 2014-06-27 ENCOUNTER — Encounter: Payer: Self-pay | Admitting: Cardiology

## 2014-06-27 ENCOUNTER — Ambulatory Visit (INDEPENDENT_AMBULATORY_CARE_PROVIDER_SITE_OTHER): Payer: Medicare Other | Admitting: Cardiology

## 2014-06-27 VITALS — BP 138/62 | HR 60 | Ht 63.0 in | Wt 110.4 lb

## 2014-06-27 DIAGNOSIS — I358 Other nonrheumatic aortic valve disorders: Secondary | ICD-10-CM

## 2014-06-27 DIAGNOSIS — I483 Typical atrial flutter: Secondary | ICD-10-CM

## 2014-06-27 DIAGNOSIS — Z7901 Long term (current) use of anticoagulants: Secondary | ICD-10-CM

## 2014-06-27 NOTE — Assessment & Plan Note (Signed)
He has mild aortic valve disease. He is totally asymptomatic. I've chosen not to repeat an echo. Consideration could be given to an echo next year.

## 2014-06-27 NOTE — Assessment & Plan Note (Signed)
He is anticoagulated with Coumadin. I mentioned that other agents could be considered. For now here he and his wife are very comfortable continuing Coumadin.  I told the patient and his wife that I will be retiring before his one year follow-up. His wife is followed by Dr. Martinique. They have requested follow-up with Dr. Martinique for Mr. Patrick Walker.

## 2014-06-27 NOTE — Assessment & Plan Note (Signed)
He had atrial flutter in 2012. He converted spontaneously to sinus. He is anticoagulated. He is not having any palpitations. No further workup.

## 2014-06-27 NOTE — Progress Notes (Signed)
Patient ID: Patrick Walker, male   DOB: 1925-05-09, 80 y.o.   MRN: [id number]    HPI  Patient is seen today to follow-up with history of atrial flutter. He is actually doing very well. He's not having any palpitations. He does continue on Coumadin. He is very active.  No Known Allergies  Current Outpatient Prescriptions  Medication Sig Dispense Refill  . diltiazem (CARDIZEM CD) 240 MG 24 hr capsule TAKE 1 CAPSULE BY MOUTH ONCE A DAY  90 capsule  2  . warfarin (COUMADIN) 2.5 MG tablet Take as directed by coumadin clinic 1 tab M, W, F 2 tab every other day       No current facility-administered medications for this visit.    History   Social History  . Marital Status: Married    Spouse Name: N/A    Number of Children: N/A  . Years of Education: N/A   Occupational History  . Not on file.   Social History Main Topics  . Smoking status: Never Smoker   . Smokeless tobacco: Not on file  . Alcohol Use: No  . Drug Use: No  . Sexual Activity:    Other Topics Concern  . Not on file   Social History Narrative  . No narrative on file    History reviewed. No pertinent family history.  Past Medical History  Diagnosis Date  . Irritable bowel disease   . Difficulty in urination   . Hearing loss   . Glaucoma   . Arthritis   . Atrial flutter     New diagnosis November, 2012  . TSH (thyroid-stimulating hormone deficiency)     TSH 6.4,  November, 2012  . Shortness of breath     November, 2012  . Ejection fraction     EF 55-60%, echo, November, 2012 ( rapid atrial flutter at that time)  . Aortic insufficiency     Mild, echo, November, 2011  . Aortic valve sclerosis     Echo, November, 2011  . Sinus bradycardia     Past Surgical History  Procedure Laterality Date  . Hernia repair  03/02/2011  . Back surgery  2008-2009  . Hand surgery  1984-85  . Leg surgery  1972    Patient Active Problem List   Diagnosis Date Noted  . Sinus bradycardia   . Long term current use of  anticoagulant 08/04/2011  . Aortic valve sclerosis   . Aortic insufficiency   . Atrial flutter   . TSH (thyroid-stimulating hormone deficiency)   . Hearing loss   . Shortness of breath   . Inguinal hernia - left s/p lap [facility] repair [id number] 03/16/2011    ROS   Patient denies fever, chills, headache, sweats, rash, change in vision, change in hearing, chest pain, cough, nausea or vomiting, urinary symptoms. All other systems are reviewed and are negative.  PHYSICAL EXAM  Patient is oriented to person time and place. Affect is normal. He is here with his wife. Head is atraumatic. Sclera and conjunctiva are normal. Lungs are clear. Respiratory effort is not labored. Cardiac exam reveals S1 and S2. There is a 2/6 crescendo decrescendo systolic murmur. The abdomen is soft. There is no peripheral edema. There are no musculoskeletal deformities. There are no skin rashes.  Filed Vitals:   06/27/14 0902  BP: 138/62  Pulse: 60  Height: 5\' 3"  (1.6 m)  Weight: 110 lb 6.4 oz (50.077 kg)  SpO2: 98%     ASSESSMENT & PLAN

## 2014-06-27 NOTE — Patient Instructions (Signed)
**Note De-identified  Obfuscation** Your physician recommends that you continue on your current medications as directed. Please refer to the Current Medication list given to you today.  Your physician wants you to follow-up in: 1 year. You will receive a reminder letter in the mail two months in advance. If you don't receive a letter, please call our office to schedule the follow-up appointment.  

## 2014-07-17 ENCOUNTER — Telehealth: Payer: Self-pay | Admitting: Cardiology

## 2014-07-17 ENCOUNTER — Other Ambulatory Visit: Payer: Self-pay | Admitting: Pharmacist Clinician (PhC)/ Clinical Pharmacy Specialist

## 2014-07-17 NOTE — Telephone Encounter (Signed)
Wife calling for refill on warfarin.  INR monitored by Dr. Welton Flakes, referred her to that office for refill

## 2014-07-18 ENCOUNTER — Other Ambulatory Visit: Payer: Self-pay | Admitting: Cardiology

## 2014-08-17 ENCOUNTER — Other Ambulatory Visit: Payer: Self-pay | Admitting: *Deleted

## 2014-08-17 MED ORDER — DILTIAZEM HCL ER COATED BEADS 240 MG PO CP24: ORAL_CAPSULE | ORAL | Status: DC

## 2014-09-26 ENCOUNTER — Telehealth: Payer: Self-pay | Admitting: Cardiology

## 2014-09-26 NOTE — Telephone Encounter (Signed)
Will forward to Dr Ron Parker. Please advise.

## 2014-09-26 NOTE — Telephone Encounter (Signed)
New message    1. What dental office are you calling from? Wife calling    2. What is your office phone and fax number? Dr. Radford Pax office [phone]  3. What type of procedure is the patient having performed? Tooth extraction    4. What date is procedure scheduled? 1.27.2016  Not time set .   5. What is your question (ex. Antibiotics prior to procedure, holding medication-we need to know how long dentist wants pt to hold med)? Coumadin 2.5 mg & penicillin.

## 2014-09-28 NOTE — Telephone Encounter (Signed)
He does not need an antibiotic for his tooth removal from the cardiac viewpoint.  If there is concern about bleeding from the tooth removal, Coumadin can be held for 5 days before the procedure and started back on the day of the procedure.

## 2014-10-01 NOTE — Telephone Encounter (Signed)
[place], the pts wife, is advised and she verbalized understanding. I have also faxed this note to Dr Landis Gandy office.

## 2014-10-01 NOTE — Telephone Encounter (Signed)
**Note De-identified  Obfuscation** LMTCB

## 2015-05-16 ENCOUNTER — Other Ambulatory Visit: Payer: Self-pay | Admitting: Cardiology

## 2015-05-16 ENCOUNTER — Other Ambulatory Visit: Payer: Self-pay | Admitting: *Deleted

## 2015-05-16 MED ORDER — DILTIAZEM HCL ER COATED BEADS 240 MG PO CP24: 240.0000 mg | ORAL_CAPSULE | Freq: Every day | ORAL | Status: DC

## 2015-07-12 ENCOUNTER — Encounter: Payer: Self-pay | Admitting: Cardiology

## 2015-07-12 ENCOUNTER — Ambulatory Visit (INDEPENDENT_AMBULATORY_CARE_PROVIDER_SITE_OTHER): Payer: Medicare Other | Admitting: Cardiology

## 2015-07-12 VITALS — BP 130/56 | HR 54 | Ht 61.0 in | Wt 105.2 lb

## 2015-07-12 DIAGNOSIS — Z7901 Long term (current) use of anticoagulants: Secondary | ICD-10-CM

## 2015-07-12 DIAGNOSIS — I483 Typical atrial flutter: Secondary | ICD-10-CM | POA: Diagnosis not present

## 2015-07-12 DIAGNOSIS — I351 Nonrheumatic aortic (valve) insufficiency: Secondary | ICD-10-CM | POA: Diagnosis not present

## 2015-07-12 MED ORDER — DILTIAZEM HCL ER COATED BEADS 240 MG PO CP24: 240.0000 mg | ORAL_CAPSULE | Freq: Every day | ORAL | Status: DC

## 2015-07-12 NOTE — Progress Notes (Signed)
Cardiology Office Note   Date:  07/12/2015   ID:  Patrick Walker, DOB 1925/05/20, MRN [id number]  PCP:  Leonard Downing, MD  Cardiologist:   Dartanion Teo Martinique, MD   Chief Complaint  Patient presents with  . Atrial Fibrillation      History of Present Illness: Patrick Walker is a 81 y.o. male who presents for establishment of cardiac follow up. He is a former patient of Dr. Ron Parker. He has a history of atrial flutter and is on chronic diltiazem and Coumadin. INR is followed by Dr. Arelia Sneddon. His episode of atrial flutter was in 2012 and converted spontaneously. He has some AV disease that is poorly characterized. Prior Echo in 2012 was done with a HR of 150 bpm. He is seen today with his son. He reports he is doing well. Still very active doing his own lab work. No chest pain. No palpitations. He does note dyspnea with exertion. No bleeding problems on coumadin.   Past Medical History  Diagnosis Date  . Irritable bowel disease   . Difficulty in urination   . Hearing loss   . Glaucoma   . Arthritis   . Atrial flutter [facility])     New diagnosis November, 2012  . TSH (thyroid-stimulating hormone deficiency)     TSH 6.4,  November, 2012  . Shortness of breath     November, 2012  . Ejection fraction     EF 55-60%, echo, November, 2012 ( rapid atrial flutter at that time)  . Aortic insufficiency     Mild, echo, November, 2011  . Aortic valve sclerosis     Echo, November, 2011  . Sinus bradycardia     Past Surgical History  Procedure Laterality Date  . Hernia repair  03/02/2011  . Back surgery  2008-2009  . Hand surgery  1984-85  . Leg surgery  1972     Current Outpatient Prescriptions  Medication Sig Dispense Refill  . diltiazem (CARDIZEM CD) 240 MG 24 hr capsule Take 1 capsule (240 mg total) by mouth daily. 90 capsule 3  . warfarin (COUMADIN) 2.5 MG tablet Take as directed by coumadin clinic 1 tab M, W, F 2 tab every other day     No current facility-administered medications  for this visit.    Allergies:   Review of patient's allergies indicates no known allergies.    Social History:  The patient  reports that he has never smoked. He does not have any smokeless tobacco history on file. He reports that he does not drink alcohol or use illicit drugs.   Family History:  The patient's family history is positive for heart disease in his mother.   ROS:  Please see the history of present illness.   Otherwise, review of systems are positive for none.   All other systems are reviewed and negative.    PHYSICAL EXAM: VS:  BP 130/56 mmHg  Pulse 54  Ht 5\' 1"  (1.549 m)  Wt 47.713 kg (105 lb 3 oz)  BMI 19.89 kg/m2 , BMI Body mass index is 19.89 kg/(m^2). GEN: Well nourished, elderly, in no acute distress HEENT: normal Neck: no JVD, carotid bruits, or masses Cardiac: RRR; there is a harsh 3/6 systolic murmur loudest at the apex, radiating to the RUSB.  Respiratory:  clear to auscultation bilaterally, normal work of breathing GI: soft, nontender, nondistended, + BS MS: no deformity or atrophy Skin: warm and dry, no rash Neuro:  Strength and sensation are intact Psych: euthymic  mood, full affect   EKG:  EKG is ordered today. The ekg ordered today demonstrates sinus brady with rate 54. Otherwise normal. I have personally reviewed and interpreted this study.    Recent Labs: No results found for requested labs within last 365 days.    Lipid Panel No results found for: CHOL, TRIG, HDL, CHOLHDL, VLDL, LDLCALC, LDLDIRECT    Wt Readings from Last 3 Encounters:  07/12/15 47.713 kg (105 lb 3 oz)  06/27/14 50.077 kg (110 lb 6.4 oz)  02/15/14 49.442 kg (109 lb)      Other studies Reviewed: Additional studies/ records that were reviewed today include: Patient brings records from [facility] screening.  Review of the above records demonstrates: All normal.   ASSESSMENT AND PLAN:  1.  Atrial flutter. Maintaining NSR. Continue anticoagulation with Coumadin.  CHAD-Vasc score of 2.   2. Aortic stenosis by exam. Severity unknown. He does have symptoms of dyspnea on exertion. I have recommended an Echocardiogram to assess further.    Current medicines are reviewed at length with the patient today.  The patient does not have concerns regarding medicines.  The following changes have been made:  no change  Labs/ tests ordered today include:  Orders Placed This Encounter  Procedures  . EKG 12-Lead  . ECHOCARDIOGRAM COMPLETE     Disposition:   FU with Dr. Martinique in 1 year providing Echo is OK  Signed, Carmaleta Youngers Martinique, MD  07/12/2015 3:42 PM    [place] [address], [place], [place], [address] Phone [phone], Fax [phone]

## 2015-07-12 NOTE — Patient Instructions (Signed)
We will schedule you for an Echocardiogram  Continue your current therapy  I will see you in one year   

## 2015-07-17 ENCOUNTER — Other Ambulatory Visit: Payer: Self-pay

## 2015-07-17 ENCOUNTER — Ambulatory Visit (HOSPITAL_COMMUNITY): Payer: Medicare Other | Attending: Internal Medicine

## 2015-07-17 DIAGNOSIS — I351 Nonrheumatic aortic (valve) insufficiency: Secondary | ICD-10-CM

## 2015-07-17 DIAGNOSIS — I071 Rheumatic tricuspid insufficiency: Secondary | ICD-10-CM | POA: Insufficient documentation

## 2015-07-17 DIAGNOSIS — Z7901 Long term (current) use of anticoagulants: Secondary | ICD-10-CM | POA: Diagnosis not present

## 2015-07-17 DIAGNOSIS — I340 Nonrheumatic mitral (valve) insufficiency: Secondary | ICD-10-CM | POA: Diagnosis not present

## 2015-07-17 DIAGNOSIS — I483 Typical atrial flutter: Secondary | ICD-10-CM | POA: Diagnosis not present

## 2015-07-17 DIAGNOSIS — I352 Nonrheumatic aortic (valve) stenosis with insufficiency: Secondary | ICD-10-CM | POA: Diagnosis not present

## 2016-02-20 ENCOUNTER — Emergency Department (HOSPITAL_COMMUNITY)
Admission: EM | Admit: 2016-02-20 | Discharge: 2016-02-20 | Disposition: A | Discharge status: Home / Self Care | Payer: Medicare Other | Attending: Emergency Medicine | Admitting: Emergency Medicine

## 2016-02-20 ENCOUNTER — Emergency Department (HOSPITAL_COMMUNITY): Payer: Medicare Other

## 2016-02-20 ENCOUNTER — Encounter (HOSPITAL_COMMUNITY): Payer: Self-pay | Admitting: Emergency Medicine

## 2016-02-20 DIAGNOSIS — R109 Unspecified abdominal pain: Secondary | ICD-10-CM | POA: Diagnosis not present

## 2016-02-20 DIAGNOSIS — Z7901 Long term (current) use of anticoagulants: Secondary | ICD-10-CM | POA: Diagnosis not present

## 2016-02-20 DIAGNOSIS — Z79899 Other long term (current) drug therapy: Secondary | ICD-10-CM | POA: Insufficient documentation

## 2016-02-20 DIAGNOSIS — M545 Low back pain, unspecified: Secondary | ICD-10-CM

## 2016-02-20 LAB — URINE MICROSCOPIC-ADD ON
RBC / HPF: NONE SEEN RBC/hpf (ref 0–5)
Squamous Epithelial / LPF: NONE SEEN

## 2016-02-20 LAB — COMPREHENSIVE METABOLIC PANEL
ALBUMIN: 3.4 g/dL — AB (ref 3.5–5.0)
ALK PHOS: 59 U/L (ref 38–126)
ALT: 12 U/L — AB (ref 17–63)
AST: 19 U/L (ref 15–41)
Anion gap: 8 (ref 5–15)
BILIRUBIN TOTAL: 0.9 mg/dL (ref 0.3–1.2)
BUN: 24 mg/dL — AB (ref 6–20)
CHLORIDE: 102 mmol/L (ref 101–111)
CO2: 28 mmol/L (ref 22–32)
Calcium: 9.3 mg/dL (ref 8.9–10.3)
Creatinine, Ser: 1.39 mg/dL — ABNORMAL HIGH (ref 0.61–1.24)
GFR calc Af Amer: 51 mL/min — ABNORMAL LOW (ref >60)
GFR calc non Af Amer: 44 mL/min — ABNORMAL LOW (ref >60)
GLUCOSE: 119 mg/dL — AB (ref 65–99)
POTASSIUM: 4.6 mmol/L (ref 3.5–5.1)
Sodium: 138 mmol/L (ref 135–145)
TOTAL PROTEIN: 6.8 g/dL (ref 6.5–8.1)

## 2016-02-20 LAB — URINALYSIS, ROUTINE W REFLEX MICROSCOPIC
Bilirubin Urine: NEGATIVE
GLUCOSE, UA: NEGATIVE mg/dL
Ketones, ur: NEGATIVE mg/dL
Nitrite: NEGATIVE
PH: 5.5 (ref 5.0–8.0)
PROTEIN: 30 mg/dL — AB
SPECIFIC GRAVITY, URINE: 1.02 (ref 1.005–1.030)

## 2016-02-20 LAB — CBC WITH DIFFERENTIAL/PLATELET
BASOS ABS: 0 10*3/uL (ref 0.0–0.1)
BASOS PCT: 0 %
Eosinophils Absolute: 0 10*3/uL (ref 0.0–0.7)
Eosinophils Relative: 0 %
HEMATOCRIT: 37.7 % — AB (ref 39.0–52.0)
HEMOGLOBIN: 12.4 g/dL — AB (ref 13.0–17.0)
Lymphocytes Relative: 7 %
Lymphs Abs: 0.5 10*3/uL — ABNORMAL LOW (ref 0.7–4.0)
MCH: 31.9 pg (ref 26.0–34.0)
MCHC: 32.9 g/dL (ref 30.0–36.0)
MCV: 96.9 fL (ref 78.0–100.0)
MONOS PCT: 15 %
Monocytes Absolute: 0.9 10*3/uL (ref 0.1–1.0)
NEUTROS ABS: 5 10*3/uL (ref 1.7–7.7)
NEUTROS PCT: 78 %
Platelets: 170 10*3/uL (ref 150–400)
RBC: 3.89 MIL/uL — ABNORMAL LOW (ref 4.22–5.81)
RDW: 12.6 % (ref 11.5–15.5)
WBC: 6.4 10*3/uL (ref 4.0–10.5)

## 2016-02-20 MED ORDER — ONDANSETRON HCL 4 MG/2ML IJ SOLN
4.0000 mg | Freq: Once | INTRAMUSCULAR | Status: AC
  Administered 2016-02-20: 4 mg via INTRAVENOUS
  Filled 2016-02-20: qty 2

## 2016-02-20 MED ORDER — TRAMADOL HCL 50 MG PO TABS
50.0000 mg | ORAL_TABLET | Freq: Once | ORAL | Status: AC
  Administered 2016-02-20: 50 mg via ORAL
  Filled 2016-02-20: qty 1

## 2016-02-20 MED ORDER — HYDROMORPHONE HCL 1 MG/ML IJ SOLN
0.5000 mg | Freq: Once | INTRAMUSCULAR | Status: AC
  Administered 2016-02-20: 0.5 mg via INTRAVENOUS
  Filled 2016-02-20: qty 1

## 2016-02-20 NOTE — ED Notes (Signed)
Patient arrives with several days of right lower back pain. Denies injury. States he was seen by his PCP for the same. Patient able to ambulate short distances. Per patient standing doesn't exacerbate pain. HOH.

## 2016-02-20 NOTE — Discharge Instructions (Signed)
Follow up with your md next week.  Take your ultram for pain

## 2016-02-20 NOTE — ED Notes (Signed)
Pt reports he was doing a lot of garden work with back bent down for multiple hours and has had pain since. Worse with movement. No urinary difficulty reported. A/o x4.

## 2016-02-20 NOTE — ED Provider Notes (Signed)
CSN: [id number]     Arrival date & time 02/20/16  [id number] History   First MD Initiated Contact with Patient 02/20/16 [phone]     Chief Complaint  Patient presents with  . Back Pain     (Consider location/radiation/quality/duration/timing/severity/associated sxs/prior Treatment) Patient is a 82 y.o. male presenting with back pain. The history is provided by the patient (Patient complains of low back pain and some flank pain. Worse with movement).  Back Pain Location:  Lumbar spine Quality:  Aching Radiates to: Right buttocks. Pain severity:  Moderate Pain is:  Worse during the day Onset quality:  Gradual Timing:  Intermittent Progression:  Waxing and waning Associated symptoms: no abdominal pain, no chest pain and no headaches     Past Medical History  Diagnosis Date  . Irritable bowel disease   . Difficulty in urination   . Hearing loss   . Glaucoma   . Arthritis   . Atrial flutter [facility])     New diagnosis November, 2012  . TSH (thyroid-stimulating hormone deficiency)     TSH 6.4,  November, 2012  . Shortness of breath     November, 2012  . Ejection fraction     EF 55-60%, echo, November, 2012 ( rapid atrial flutter at that time)  . Aortic insufficiency     Mild, echo, November, 2011  . Aortic valve sclerosis     Echo, November, 2011  . Sinus bradycardia    Past Surgical History  Procedure Laterality Date  . Hernia repair  03/02/2011  . Back surgery  2008-2009  . Hand surgery  1984-85  . Leg surgery  1972   History reviewed. No pertinent family history. Social History  Substance Use Topics  . Smoking status: Never Smoker   . Smokeless tobacco: None  . Alcohol Use: No    Review of Systems  Constitutional: Negative for appetite change and fatigue.  HENT: Negative for congestion, ear discharge and sinus pressure.   Eyes: Negative for discharge.  Respiratory: Negative for cough.   Cardiovascular: Negative for chest pain.  Gastrointestinal: Negative for abdominal  pain and diarrhea.  Genitourinary: Negative for frequency and hematuria.  Musculoskeletal: Positive for back pain.  Skin: Negative for rash.  Neurological: Negative for seizures and headaches.  Psychiatric/Behavioral: Negative for hallucinations.      Allergies  Review of patient's allergies indicates no known allergies.  Home Medications   Prior to Admission medications   Medication Sig Start Date End Date Taking? Authorizing Provider  apixaban (ELIQUIS) 2.5 MG TABS tablet Take 2.5 mg by mouth 2 (two) times daily.   Yes Historical Provider, MD  diltiazem (CARDIZEM CD) 240 MG 24 hr capsule Take 1 capsule (240 mg total) by mouth daily. 07/12/15  Yes Peter M Martinique, MD  loperamide (IMODIUM) 2 MG capsule Take 2 mg by mouth as needed for diarrhea or loose stools.   Yes Historical Provider, MD  warfarin (COUMADIN) 2.5 MG tablet Take as directed by coumadin clinic 1 tab M, W, F 2 tab every other day 10/23/13   Carlena Bjornstad, MD   BP 129/59 mmHg  Pulse 51  Temp(Src) 97.7 F (36.5 C) (Oral)  Resp 20  Ht 5\' 2"  (1.575 m)  Wt 103 lb (46.72 kg)  BMI 18.83 kg/m2  SpO2 94% Physical Exam  Constitutional: He is oriented to person, place, and time. He appears well-developed.  HENT:  Head: Normocephalic.  Eyes: Conjunctivae and EOM are normal. No scleral icterus.  Neck: Neck supple. No thyromegaly  present.  Cardiovascular: Normal rate and regular rhythm.  Exam reveals no gallop and no friction rub.   No murmur heard. Pulmonary/Chest: No stridor. He has no wheezes. He has no rales. He exhibits no tenderness.  Abdominal: He exhibits no distension. There is no tenderness. There is no rebound.  Musculoskeletal: He exhibits no edema.  Tender lumbar spine and right hip  Lymphadenopathy:    He has no cervical adenopathy.  Neurological: He is oriented to person, place, and time. He exhibits normal muscle tone. Coordination normal.  Skin: No rash noted. No erythema.  Psychiatric: He has a  normal mood and affect. His behavior is normal.    ED Course  Procedures (including critical care time) Labs Review Labs Reviewed  CBC WITH DIFFERENTIAL/PLATELET - Abnormal; Notable for the following:    RBC 3.89 (*)    Hemoglobin 12.4 (*)    HCT 37.7 (*)    Lymphs Abs 0.5 (*)    All other components within normal limits  COMPREHENSIVE METABOLIC PANEL - Abnormal; Notable for the following:    Glucose, Bld 119 (*)    BUN 24 (*)    Creatinine, Ser 1.39 (*)    Albumin 3.4 (*)    ALT 12 (*)    GFR calc non Af Amer 44 (*)    GFR calc Af Amer 51 (*)    All other components within normal limits  URINALYSIS, ROUTINE W REFLEX MICROSCOPIC (NOT AT [facility]) - Abnormal; Notable for the following:    Hgb urine dipstick TRACE (*)    Protein, ur 30 (*)    Leukocytes, UA SMALL (*)    All other components within normal limits  URINE MICROSCOPIC-ADD ON - Abnormal; Notable for the following:    Bacteria, UA FEW (*)    Casts GRANULAR CAST (*)    All other components within normal limits    Imaging Review Ct Renal Stone Study  02/20/2016  CLINICAL DATA:  Right flank and back pain for 4 days, worsening. EXAM: CT ABDOMEN AND PELVIS WITHOUT CONTRAST TECHNIQUE: Multidetector CT imaging of the abdomen and pelvis was performed following the standard protocol without IV contrast. COMPARISON:  None. FINDINGS: Mild bibasilar atelectasis is seen. No pleural or pericardial effusion. There is no hydronephrosis on the right or left. No urinary tract stones are identified. The prostate gland is mildly enlarged. The urinary bladder is nearly completely decompressed but otherwise unremarkable. Small bilateral renal cysts are noted. The liver, gallbladder, spleen, pancreas and adrenal glands appear normal. Aortoiliac atherosclerosis without aneurysm is identified. There is a small volume of simple appearing free pelvic fluid. No focal fluid collection is present. There is no lymphadenopathy. Diverticulosis of the  descending and sigmoid colon without diverticulitis is identified. The colon is otherwise unremarkable. The stomach and small bowel appear normal. The patient is status post L1 vertebral augmentation. Degenerative change is present about the hips and lumbar spine. No lytic or sclerotic bony lesion. IMPRESSION: Negative for urinary tract stone.  No hydronephrosis. Small volume of free pelvic fluid is nonspecific but may be due to gastroenteritis. Cause for the fluid is not identified. Diverticulosis without diverticulitis. Mild prostatomegaly. Atherosclerosis. Degenerative change about the hips and lumbar spine. Electronically Signed   By: Inge Rise M.D.   On: 02/20/2016 09:41   I have personally reviewed and evaluated these images and lab results as part of my medical decision-making.   EKG Interpretation None      MDM   Final diagnoses:  Back pain  at L4-L5 level    CT scan shows degenerative changes in his hips and lumbar spine. Patient improved with pain medicine injection he'll be sent home on Ultram will follow-up with PCP    Milton Ferguson, MD 02/20/16 1154

## 2016-02-20 NOTE — ED Notes (Signed)
Pt ambulates independently and with steady gait at time of discharge. Discharge instructions and follow up information reviewed with patient. No other questions or concerns voiced at this time.  

## 2016-02-23 ENCOUNTER — Inpatient Hospital Stay (HOSPITAL_COMMUNITY)
Admission: EM | Admit: 2016-02-23 | Discharge: 2016-02-25 | DRG: 683 | Disposition: A | Discharge status: Home Health | Payer: Medicare Other | Attending: Internal Medicine | Admitting: Internal Medicine

## 2016-02-23 ENCOUNTER — Emergency Department (HOSPITAL_COMMUNITY): Payer: Medicare Other

## 2016-02-23 ENCOUNTER — Other Ambulatory Visit: Payer: Self-pay

## 2016-02-23 ENCOUNTER — Encounter (HOSPITAL_COMMUNITY): Payer: Self-pay | Admitting: Emergency Medicine

## 2016-02-23 ENCOUNTER — Other Ambulatory Visit (HOSPITAL_COMMUNITY): Payer: Self-pay

## 2016-02-23 DIAGNOSIS — K5732 Diverticulitis of large intestine without perforation or abscess without bleeding: Secondary | ICD-10-CM | POA: Diagnosis present

## 2016-02-23 DIAGNOSIS — K5792 Diverticulitis of intestine, part unspecified, without perforation or abscess without bleeding: Secondary | ICD-10-CM | POA: Diagnosis present

## 2016-02-23 DIAGNOSIS — Z681 Body mass index (BMI) 19 or less, adult: Secondary | ICD-10-CM

## 2016-02-23 DIAGNOSIS — Z7901 Long term (current) use of anticoagulants: Secondary | ICD-10-CM

## 2016-02-23 DIAGNOSIS — E860 Dehydration: Secondary | ICD-10-CM | POA: Diagnosis present

## 2016-02-23 DIAGNOSIS — R109 Unspecified abdominal pain: Secondary | ICD-10-CM

## 2016-02-23 DIAGNOSIS — E038 Other specified hypothyroidism: Secondary | ICD-10-CM | POA: Diagnosis present

## 2016-02-23 DIAGNOSIS — H409 Unspecified glaucoma: Secondary | ICD-10-CM | POA: Diagnosis present

## 2016-02-23 DIAGNOSIS — R64 Cachexia: Secondary | ICD-10-CM | POA: Diagnosis present

## 2016-02-23 DIAGNOSIS — E440 Moderate protein-calorie malnutrition: Secondary | ICD-10-CM | POA: Insufficient documentation

## 2016-02-23 DIAGNOSIS — N179 Acute kidney failure, unspecified: Secondary | ICD-10-CM | POA: Diagnosis not present

## 2016-02-23 DIAGNOSIS — K5900 Constipation, unspecified: Secondary | ICD-10-CM | POA: Diagnosis present

## 2016-02-23 DIAGNOSIS — I351 Nonrheumatic aortic (valve) insufficiency: Secondary | ICD-10-CM | POA: Diagnosis present

## 2016-02-23 DIAGNOSIS — R110 Nausea: Secondary | ICD-10-CM | POA: Diagnosis present

## 2016-02-23 DIAGNOSIS — H9190 Unspecified hearing loss, unspecified ear: Secondary | ICD-10-CM | POA: Diagnosis present

## 2016-02-23 DIAGNOSIS — E876 Hypokalemia: Secondary | ICD-10-CM | POA: Diagnosis present

## 2016-02-23 DIAGNOSIS — R531 Weakness: Secondary | ICD-10-CM

## 2016-02-23 DIAGNOSIS — I4892 Unspecified atrial flutter: Secondary | ICD-10-CM | POA: Diagnosis present

## 2016-02-23 LAB — CBC
HEMATOCRIT: 34.7 % — AB (ref 39.0–52.0)
HEMOGLOBIN: 11.6 g/dL — AB (ref 13.0–17.0)
MCH: 32 pg (ref 26.0–34.0)
MCHC: 33.4 g/dL (ref 30.0–36.0)
MCV: 95.6 fL (ref 78.0–100.0)
Platelets: 182 10*3/uL (ref 150–400)
RBC: 3.63 MIL/uL — ABNORMAL LOW (ref 4.22–5.81)
RDW: 12.3 % (ref 11.5–15.5)
WBC: 5.8 10*3/uL (ref 4.0–10.5)

## 2016-02-23 LAB — URINALYSIS, ROUTINE W REFLEX MICROSCOPIC
BILIRUBIN URINE: NEGATIVE
GLUCOSE, UA: 100 mg/dL — AB
Ketones, ur: 15 mg/dL — AB
Leukocytes, UA: NEGATIVE
Nitrite: NEGATIVE
PH: 6 (ref 5.0–8.0)
Protein, ur: 100 mg/dL — AB
SPECIFIC GRAVITY, URINE: 1.017 (ref 1.005–1.030)

## 2016-02-23 LAB — COMPREHENSIVE METABOLIC PANEL
ALK PHOS: 59 U/L (ref 38–126)
ALT: 15 U/L — AB (ref 17–63)
AST: 22 U/L (ref 15–41)
Albumin: 3.6 g/dL (ref 3.5–5.0)
Anion gap: 10 (ref 5–15)
BILIRUBIN TOTAL: 0.8 mg/dL (ref 0.3–1.2)
BUN: 32 mg/dL — ABNORMAL HIGH (ref 6–20)
CO2: 27 mmol/L (ref 22–32)
Calcium: 9.5 mg/dL (ref 8.9–10.3)
Chloride: 98 mmol/L — ABNORMAL LOW (ref 101–111)
Creatinine, Ser: 1.4 mg/dL — ABNORMAL HIGH (ref 0.61–1.24)
GFR calc Af Amer: 50 mL/min — ABNORMAL LOW (ref >60)
GFR, EST NON AFRICAN AMERICAN: 43 mL/min — AB (ref >60)
GLUCOSE: 112 mg/dL — AB (ref 65–99)
POTASSIUM: 3.9 mmol/L (ref 3.5–5.1)
Sodium: 135 mmol/L (ref 135–145)
Total Protein: 7.3 g/dL (ref 6.5–8.1)

## 2016-02-23 LAB — URINE MICROSCOPIC-ADD ON
Bacteria, UA: NONE SEEN
WBC UA: NONE SEEN WBC/hpf (ref 0–5)

## 2016-02-23 LAB — CBG MONITORING, ED: Glucose-Capillary: 108 mg/dL — ABNORMAL HIGH (ref 65–99)

## 2016-02-23 LAB — LIPASE, BLOOD: Lipase: 17 U/L (ref 11–51)

## 2016-02-23 LAB — I-STAT TROPONIN, ED: Troponin i, poc: 0 ng/mL (ref 0.00–0.08)

## 2016-02-23 LAB — TSH: TSH: 3.695 u[IU]/mL (ref 0.350–4.500)

## 2016-02-23 LAB — TROPONIN I

## 2016-02-23 MED ORDER — METRONIDAZOLE IN NACL 5-0.79 MG/ML-% IV SOLN
500.0000 mg | Freq: Three times a day (TID) | INTRAVENOUS | Status: DC
  Administered 2016-02-23 – 2016-02-25 (×5): 500 mg via INTRAVENOUS
  Filled 2016-02-23 (×5): qty 100

## 2016-02-23 MED ORDER — BOOST / RESOURCE BREEZE PO LIQD
1.0000 | Freq: Three times a day (TID) | ORAL | Status: DC
  Administered 2016-02-23 – 2016-02-24 (×2): 1 via ORAL

## 2016-02-23 MED ORDER — MILK AND MOLASSES ENEMA
1.0000 | Freq: Once | RECTAL | Status: AC
  Administered 2016-02-23: 250 mL via RECTAL
  Filled 2016-02-23 (×2): qty 250

## 2016-02-23 MED ORDER — MORPHINE SULFATE (PF) 2 MG/ML IV SOLN: 1.0000 mg | INTRAVENOUS | Status: DC | PRN

## 2016-02-23 MED ORDER — ONDANSETRON HCL 4 MG/2ML IJ SOLN
4.0000 mg | Freq: Four times a day (QID) | INTRAMUSCULAR | Status: DC | PRN
  Administered 2016-02-23: 4 mg via INTRAVENOUS
  Filled 2016-02-23: qty 2

## 2016-02-23 MED ORDER — ACETAMINOPHEN 325 MG PO TABS: 650.0000 mg | ORAL_TABLET | Freq: Four times a day (QID) | ORAL | Status: DC | PRN

## 2016-02-23 MED ORDER — TRAMADOL HCL 50 MG PO TABS: 50.0000 mg | ORAL_TABLET | Freq: Four times a day (QID) | ORAL | Status: DC | PRN

## 2016-02-23 MED ORDER — ONDANSETRON HCL 4 MG PO TABS: 4.0000 mg | ORAL_TABLET | Freq: Four times a day (QID) | ORAL | Status: DC | PRN

## 2016-02-23 MED ORDER — METRONIDAZOLE IN NACL 5-0.79 MG/ML-% IV SOLN
500.0000 mg | Freq: Once | INTRAVENOUS | Status: AC
  Administered 2016-02-23: 500 mg via INTRAVENOUS
  Filled 2016-02-23: qty 100

## 2016-02-23 MED ORDER — APIXABAN 2.5 MG PO TABS
2.5000 mg | ORAL_TABLET | Freq: Two times a day (BID) | ORAL | Status: DC
  Administered 2016-02-23 – 2016-02-25 (×4): 2.5 mg via ORAL
  Filled 2016-02-23 (×4): qty 1

## 2016-02-23 MED ORDER — DEXTROSE 5 % IV SOLN
2.0000 g | Freq: Once | INTRAVENOUS | Status: AC
  Administered 2016-02-23: 2 g via INTRAVENOUS
  Filled 2016-02-23: qty 2

## 2016-02-23 MED ORDER — SODIUM CHLORIDE 0.9 % IV BOLUS (SEPSIS)
500.0000 mL | Freq: Once | INTRAVENOUS | Status: AC
  Administered 2016-02-23: 500 mL via INTRAVENOUS

## 2016-02-23 MED ORDER — SENNOSIDES-DOCUSATE SODIUM 8.6-50 MG PO TABS
1.0000 | ORAL_TABLET | Freq: Two times a day (BID) | ORAL | Status: DC
  Administered 2016-02-23 – 2016-02-24 (×2): 1 via ORAL
  Filled 2016-02-23 (×2): qty 1

## 2016-02-23 MED ORDER — DEXTROSE 5 % IV SOLN
2.0000 g | INTRAVENOUS | Status: DC
  Administered 2016-02-24 – 2016-02-25 (×2): 2 g via INTRAVENOUS
  Filled 2016-02-23 (×2): qty 2

## 2016-02-23 MED ORDER — ACETAMINOPHEN 650 MG RE SUPP: 650.0000 mg | Freq: Four times a day (QID) | RECTAL | Status: DC | PRN

## 2016-02-23 MED ORDER — DEXTROSE 5 % IV SOLN
2.0000 g | INTRAVENOUS | Status: DC
  Filled 2016-02-23: qty 2

## 2016-02-23 MED ORDER — DOCUSATE SODIUM 100 MG PO CAPS
100.0000 mg | ORAL_CAPSULE | Freq: Every day | ORAL | Status: DC | PRN
Start: 2016-02-23 — End: 2016-02-25

## 2016-02-23 MED ORDER — SODIUM CHLORIDE 0.9 % IV SOLN
INTRAVENOUS | Status: DC
  Administered 2016-02-23 (×2): via INTRAVENOUS

## 2016-02-23 MED ORDER — DILTIAZEM HCL ER COATED BEADS 240 MG PO CP24
240.0000 mg | ORAL_CAPSULE | Freq: Every day | ORAL | Status: DC
  Administered 2016-02-23 – 2016-02-25 (×3): 240 mg via ORAL
  Filled 2016-02-23: qty 2
  Filled 2016-02-23: qty 1
  Filled 2016-02-23: qty 2
  Filled 2016-02-23 (×2): qty 1
  Filled 2016-02-23: qty 2

## 2016-02-23 MED ORDER — DIATRIZOATE MEGLUMINE & SODIUM 66-10 % PO SOLN
ORAL | Status: AC
  Filled 2016-02-23: qty 30

## 2016-02-23 NOTE — H&P (Signed)
History and Physical    Patrick Walker [id number] DOB: 03/10/1925 DOA: 02/23/2016  PCP: Leonard Downing, MD Patient coming from: home  Chief Complaint: abdominal pain/nausea/vomiting  HPI: Patrick Walker is a very pleasant very HOH 82 y.o. male with medical history significant for a flutter on Cardizem and Eliquis, bowel disease, aortic insufficiency Zosyn the emergency department with the chief complaint nausea vomiting decreased appetite constipation generalized weakness. Initial evaluation reveals acute kidney injury dehydration and possible diverticulitis  Information is obtained from the son is at the bedside patient is a very poor historian and very hard of hearing. Reports the last several days patient has had a decreased oral intake due to complaints of nausea. No emesis noted. He also complains of constipation generalized weakness. He reports some general low back pain last week went to see his PCP and was started on tramadol. He believes his nausea constipation began after starting tramadol. He denies chest pain palpitation headache dizziness syncope or near-syncope. He denies any abdominal pain dysuria hematuria frequency or urgency. He denies any fever chills recent sick contacts. He denies cough lower extremity edema or orthopnea.    ED Course: He is provided with IV fluids, Rocephin and Flagyl.  Review of Systems: As per HPI otherwise 10 point review of systems negative.   Ambulatory Status: Independent with unsteady gait  Past Medical History  Diagnosis Date  . Irritable bowel disease   . Difficulty in urination   . Hearing loss   . Glaucoma   . Arthritis   . Atrial flutter [facility])     New diagnosis November, 2012  . TSH (thyroid-stimulating hormone deficiency)     TSH 6.4,  November, 2012  . Shortness of breath     November, 2012  . Ejection fraction     EF 55-60%, echo, November, 2012 ( rapid atrial flutter at that time)  . Aortic insufficiency     Mild, echo,  November, 2011  . Aortic valve sclerosis     Echo, November, 2011  . Sinus bradycardia     Past Surgical History  Procedure Laterality Date  . Hernia repair  03/02/2011  . Back surgery  2008-2009  . Hand surgery  1984-85  . Leg surgery  1972    Social History   Social History  . Marital Status: Married    Spouse Name: N/A  . Number of Children: 3  . Years of Education: N/A   Occupational History  . retired Psychologist, sport and exercise    Social History Main Topics  . Smoking status: Never Smoker   . Smokeless tobacco: Not on file  . Alcohol Use: No  . Drug Use: No  . Sexual Activity: Not on file   Other Topics Concern  . Not on file   Social History Narrative    No Known Allergies  No family history on file. Family medical history is reviewed and noncontributory to the admission of this elderly gentleman  Prior to Admission medications   Medication Sig Start Date End Date Taking? Authorizing Provider  apixaban (ELIQUIS) 2.5 MG TABS tablet Take 2.5 mg by mouth 2 (two) times daily.   Yes Historical Provider, MD  diltiazem (CARDIZEM CD) 240 MG 24 hr capsule Take 1 capsule (240 mg total) by mouth daily. 07/12/15  Yes Peter M Martinique, MD  docusate sodium (COLACE) 100 MG capsule Take 100 mg by mouth daily as needed for mild constipation.   Yes Historical Provider, MD  loperamide (IMODIUM) 2 MG capsule Take 2  mg by mouth as needed for diarrhea or loose stools.   Yes Historical Provider, MD  traMADol (ULTRAM) 50 MG tablet Take 50 mg by mouth every 6 (six) hours as needed.   Yes Historical Provider, MD    Physical Exam: Filed Vitals:   02/23/16 1200 02/23/16 1230 02/23/16 1430 02/23/16 1641  BP: 160/77 154/80 153/83 153/79  Pulse: 78 75 74 75  Temp:      TempSrc:      Resp: 15 12 17 16   Height:      Weight:      SpO2: 98% 99% 99% 98%     General:  Appears calm, somewhat frail and comfortable Eyes:  PERRL, EOMI, normal lids, iris ENT:  grossly normal hearing, lips & tongue, mucous  membranes of his mouth are dry slightly pale Neck:  no LAD, masses or thyromegaly Cardiovascular:  RRR, + murmur. No LE edema.  Respiratory:  CTA bilaterally, no w/r/r. Normal respiratory effort. Abdomen:  soft, ntnd, NABS Skin:  no rash or induration seen on limited exam Musculoskeletal:  grossly normal tone BUE/BLE, good ROM, no bony abnormality Psychiatric:  grossly normal mood and affect, speech fluent and appropriate, AOx3 Neurologic:  CN 2-12 grossly intact, moves all extremities in coordinated fashion, sensation intact  Labs on Admission: I have personally reviewed following labs and imaging studies  CBC:  Recent Labs Lab 02/20/16 0821 02/23/16 0919  WBC 6.4 5.8  NEUTROABS 5.0  --   HGB 12.4* 11.6*  HCT 37.7* 34.7*  MCV 96.9 95.6  PLT 170 [id number]   Basic Metabolic Panel:  Recent Labs Lab 02/20/16 0821 02/23/16 0919  NA 138 135  K 4.6 3.9  CL 102 98*  CO2 28 27  GLUCOSE 119* 112*  BUN 24* 32*  CREATININE 1.39* 1.40*  CALCIUM 9.3 9.5   GFR: Estimated Creatinine Clearance: 24.1 mL/min (by C-G formula based on Cr of 1.4). Liver Function Tests:  Recent Labs Lab 02/20/16 0821 02/23/16 0919  AST 19 22  ALT 12* 15*  ALKPHOS 59 59  BILITOT 0.9 0.8  PROT 6.8 7.3  ALBUMIN 3.4* 3.6    Recent Labs Lab 02/23/16 0919  LIPASE 17   No results for input(s): AMMONIA in the last 168 hours. Coagulation Profile: No results for input(s): INR, PROTIME in the last 168 hours. Cardiac Enzymes: No results for input(s): CKTOTAL, CKMB, CKMBINDEX, TROPONINI in the last 168 hours. BNP (last 3 results) No results for input(s): PROBNP in the last 8760 hours. HbA1C: No results for input(s): HGBA1C in the last 72 hours. CBG:  Recent Labs Lab 02/23/16 0916  GLUCAP 108*   Lipid Profile: No results for input(s): CHOL, HDL, LDLCALC, TRIG, CHOLHDL, LDLDIRECT in the last 72 hours. Thyroid Function Tests: No results for input(s): TSH, T4TOTAL, FREET4, T3FREE, THYROIDAB in the  last 72 hours. Anemia Panel: No results for input(s): VITAMINB12, FOLATE, FERRITIN, TIBC, IRON, RETICCTPCT in the last 72 hours. Urine analysis:    Component Value Date/Time   COLORURINE YELLOW 02/23/2016 1011   APPEARANCEUR CLEAR 02/23/2016 1011   LABSPEC 1.017 02/23/2016 1011   PHURINE 6.0 02/23/2016 1011   GLUCOSEU 100* 02/23/2016 1011   HGBUR SMALL* 02/23/2016 1011   BILIRUBINUR NEGATIVE 02/23/2016 1011   KETONESUR 15* 02/23/2016 1011   PROTEINUR 100* 02/23/2016 1011   NITRITE NEGATIVE 02/23/2016 1011   LEUKOCYTESUR NEGATIVE 02/23/2016 1011    Creatinine Clearance: Estimated Creatinine Clearance: 24.1 mL/min (by C-G formula based on Cr of 1.4).  Sepsis Labs: @LABRCNTIP (procalcitonin:4,lacticidven:4) )No  results found for this or any previous visit (from the past 240 hour(s)).   Radiological Exams on Admission: Ct Abdomen Pelvis Wo Contrast  02/23/2016  CLINICAL DATA:  Weakness, decreased appetite due to nausea. Back pain. EXAM: CT ABDOMEN AND PELVIS WITHOUT CONTRAST TECHNIQUE: Multidetector CT imaging of the abdomen and pelvis was performed following the standard protocol without IV contrast. COMPARISON:  CT abdomen dated 02/20/2016. FINDINGS: Lower chest: No acute findings. Chronic atelectasis at each lung base. Hepatobiliary: No mass visualized within the liver on this un-enhanced exam. Gallbladder is unremarkable. Pancreas: Partially infiltrated with fat. No mass or inflammatory process identified on this un-enhanced exam. Spleen: Within normal limits in size. Adrenals/Urinary Tract: Stable left adrenal mass with CT density measurement compatible with a benign lipid rich adrenal adenoma. Right adrenal gland not well seen but without obvious mass. Stable benign cyst exophytic to the upper pole of the left kidney. Kidneys otherwise unremarkable without stone or hydronephrosis. No ureteral or bladder calculi identified. Bladder appears normal. Stomach/Bowel: Bowel is normal in  caliber. Fairly large amount of stool is seen throughout the nondistended colon. Fairly extensive diverticulosis is seen within the colon but there are no focal inflammatory changes appreciated to suggest acute diverticulitis at this time. No paracolic inflammation. Appendix is not seen. Stomach is unremarkable. Vascular/Lymphatic: Scattered atherosclerotic changes of the normal caliber abdominal aorta and pelvic vasculature. No enlarged lymph nodes appreciated. Reproductive: No mass or other significant abnormality. Other: No abscess collection identified. No free intraperitoneal air. There is, however, subtle fluid-like or inflammatory change within the right upper quadrant adjacent to the gallbladder fossa and anterior to the transverse colon (series 201, images 27 through 30). Musculoskeletal: Degenerative changes throughout the thoracolumbar spine. Old compression fracture deformity of the L1 vertebral body status post vertebroplasty. No acute or suspicious osseous lesions seen. Superficial soft tissues are unremarkable. IMPRESSION: 1. Subtle low-density material, presumed fluid stranding/inflammation, within the right upper quadrant (just inferior to the gallbladder) and interposed between the anterior abdominal wall and transverse colon. This is of uncertain etiology but new compared to the previous CT suggesting an acute infectious or inflammatory process. Differential possibilities would include occult bowel wall inflammation or diverticulitis, cholecystitis and mesenteritis/panniculitis. Recommend follow-up CT abdomen at some point to ensure resolution and exclude the less likely possibility of neoplastic process. 2. Colonic diverticulosis without evidence of acute diverticulitis. 3. Aortic atherosclerosis. Additional chronic/incidental findings detailed above. Electronically Signed   By: Bary Richard M.D.   On: 02/23/2016 13:50   US Abdomen Limited Ruq  02/23/2016  CLINICAL DATA:  Fluid around  gallbladder on CT EXAM: US ABDOMEN LIMITED - RIGHT UPPER QUADRANT COMPARISON:  CT 02/23/2016 FINDINGS: Gallbladder: No gallstones or wall thickening visualized. No sonographic Murphy sign noted by sonographer. No pericholecystic fluid is present. Common bile duct: Diameter: Upper limits of normal at 6 mm Liver: No focal lesion identified. Within normal limits in parenchymal echogenicity. IMPRESSION: Gallbladder is normal. Fluid seen on comparison CT is not well demonstrated by ultrasound. Electronically Signed   By: Genevive Bi M.D.   On: 02/23/2016 15:22    EKG: Independently reviewed. Sinus rhythm Biatrial enlargement RSR' in V1 or V2,  Assessment/Plan Principal Problem:   Acute kidney injury (HCC) Active Problems:   Atrial flutter (HCC)   TSH (thyroid-stimulating hormone deficiency)   Diverticulitis   Dehydration   Constipation   Nausea   Generalized weakness   #1. Acute kidney injury. Likely related to decreased oral intake secondary to persistent nausea. Creatinine 1.40  on admission. -Admit -Gentle IV fluids -Hold nephrotoxins -Monitor urine output -Recheck in the morning  2. Diverticulitis. Imaging reveals low-density material of uncertain etiology/significance but could reflect infectious versus inflammatory process. He is afebrile hemodynamically stable and nontoxic appearing. Lipase is within the limits of normal. Rocephin and Flagyl were initiated in the emergency department. -We'll continue Rocephin and Flagyl for now -Provide clear liquids to be advanced as tolerated -Reevaluate tomorrow and adjust antibiotics as indicated -Analgesia and anti emetic  #3. Constipation. May be related to recent TRAM and all administration in the setting of dehydration. Imaging as noted above. -milk and molasses enema -stool softner -hold immodium  #4. Atrial flutter.chasvasc score 2. On eliquis and cardizem  EKG as noted above. -Continue home meds -monitor on tele -continue  eliquis  #5. Generalized weakness. Likely related to above in the setting of what appears to be some failure to thrive. Recently prescribed tramadol for low back pain. Denies any unintentional weight loss no signs of infectious process. No metabolic derangement. -Physical therapy -Nutritional consult -Advance diet as tolerated  #6. Persistent nausea/dehydration. Related to above -See therapy as noted above -IV fluids -Zofran -Clear liquids to be advanced as tolerated    DVT prophylaxis: on eliquis Code Status: limited  Family Communication: son at bedsid Disposition Plan: home when ready  Consults called: none  Admission status: obs    Radene Gunning MD [facility]  If 7PM-7AM, please contact night-coverage [email or web address] Password [email or web address]  02/23/2016, 5:01 PM

## 2016-02-23 NOTE — ED Notes (Signed)
MD at bedside. 

## 2016-02-23 NOTE — ED Notes (Signed)
Wife stated, he's been very weak since yesterday, unable to eat cause of being nauseated.

## 2016-02-23 NOTE — ED Notes (Signed)
Patient transported to Ultrasound 

## 2016-02-23 NOTE — ED Notes (Signed)
PO IV contrast given to patient by Eustace Moore in [place]. Eustace Moore advised this RN patient is to drink contrast over 2 hours.

## 2016-02-23 NOTE — ED Provider Notes (Signed)
CSN: [id number]     Arrival date & time 02/23/16  0908 History   First MD Initiated Contact with Patient 02/23/16 [phone]     Chief Complaint  Patient presents with  . Weakness  . Nausea     Patient is a 82 y.o. male presenting with weakness. The history is provided by the patient, a relative and the spouse. No language interpreter was used.  Weakness   STEELE ZIMMERLY is a 82 y.o. male who presents to the Emergency Department complaining of nausea, weakness.  History is provided and the patient and his family. They report that he has had one week of increased nausea, decreased appetite, constipation. He endorses generalized malaise and generalized weakness. Last bowel movement was an unknown time ago. He was started on tramadol week ago for low back pain. He denies any fevers, chest pain, abdominal pain, dysuria.  Past Medical History  Diagnosis Date  . Irritable bowel disease   . Difficulty in urination   . Hearing loss   . Glaucoma   . Arthritis   . Atrial flutter [facility])     New diagnosis November, 2012  . TSH (thyroid-stimulating hormone deficiency)     TSH 6.4,  November, 2012  . Shortness of breath     November, 2012  . Ejection fraction     EF 55-60%, echo, November, 2012 ( rapid atrial flutter at that time)  . Aortic insufficiency     Mild, echo, November, 2011  . Aortic valve sclerosis     Echo, November, 2011  . Sinus bradycardia    Past Surgical History  Procedure Laterality Date  . Hernia repair  03/02/2011  . Back surgery  2008-2009  . Hand surgery  1984-85  . Leg surgery  1972   No family history on file. Social History  Substance Use Topics  . Smoking status: Never Smoker   . Smokeless tobacco: None  . Alcohol Use: No    Review of Systems  Neurological: Positive for weakness.  All other systems reviewed and are negative.     Allergies  Review of patient's allergies indicates no known allergies.  Home Medications   Prior to Admission medications    Medication Sig Start Date End Date Taking? Authorizing Provider  apixaban (ELIQUIS) 2.5 MG TABS tablet Take 2.5 mg by mouth 2 (two) times daily.   Yes Historical Provider, MD  diltiazem (CARDIZEM CD) 240 MG 24 hr capsule Take 1 capsule (240 mg total) by mouth daily. 07/12/15  Yes Peter M Martinique, MD  docusate sodium (COLACE) 100 MG capsule Take 100 mg by mouth daily as needed for mild constipation.   Yes Historical Provider, MD  loperamide (IMODIUM) 2 MG capsule Take 2 mg by mouth as needed for diarrhea or loose stools.   Yes Historical Provider, MD  traMADol (ULTRAM) 50 MG tablet Take 50 mg by mouth every 6 (six) hours as needed.   Yes Historical Provider, MD   BP 153/83 mmHg  Pulse 74  Temp(Src) 98.7 F (37.1 C) (Oral)  Resp 17  Ht 5\' 1"  (1.549 m)  Wt 103 lb (46.72 kg)  BMI 19.47 kg/m2  SpO2 99% Physical Exam  Constitutional: He is oriented to person, place, and time. He appears well-developed and well-nourished.  HENT:  Head: Normocephalic and atraumatic.  Cardiovascular: Normal rate and regular rhythm.   SEM  Pulmonary/Chest: Effort normal and breath sounds normal. No respiratory distress.  Abdominal: Soft. There is no tenderness. There is no rebound and  no guarding.  Genitourinary:  Normal rectal tone, nontender rectal exam, empty rectal vault.   Musculoskeletal: He exhibits no edema or tenderness.  Neurological: He is alert and oriented to person, place, and time.  Generalized weakness, hard of hearing.  Slow to answer questions.   Skin: Skin is warm and dry.  Psychiatric: He has a normal mood and affect. His behavior is normal.  Nursing note and vitals reviewed.   ED Course  Procedures (including critical care time) Labs Review Labs Reviewed  COMPREHENSIVE METABOLIC PANEL - Abnormal; Notable for the following:    Chloride 98 (*)    Glucose, Bld 112 (*)    BUN 32 (*)    Creatinine, Ser 1.40 (*)    ALT 15 (*)    GFR calc non Af Amer 43 (*)    GFR calc Af Amer 50 (*)     All other components within normal limits  CBC - Abnormal; Notable for the following:    RBC 3.63 (*)    Hemoglobin 11.6 (*)    HCT 34.7 (*)    All other components within normal limits  URINALYSIS, ROUTINE W REFLEX MICROSCOPIC (NOT AT [facility]) - Abnormal; Notable for the following:    Glucose, UA 100 (*)    Hgb urine dipstick SMALL (*)    Ketones, ur 15 (*)    Protein, ur 100 (*)    All other components within normal limits  URINE MICROSCOPIC-ADD ON - Abnormal; Notable for the following:    Squamous Epithelial / LPF 0-5 (*)    All other components within normal limits  CBG MONITORING, ED - Abnormal; Notable for the following:    Glucose-Capillary 108 (*)    All other components within normal limits  LIPASE, BLOOD  I-STAT TROPOININ, ED    Imaging Review Ct Abdomen Pelvis Wo Contrast  02/23/2016  CLINICAL DATA:  Weakness, decreased appetite due to nausea. Back pain. EXAM: CT ABDOMEN AND PELVIS WITHOUT CONTRAST TECHNIQUE: Multidetector CT imaging of the abdomen and pelvis was performed following the standard protocol without IV contrast. COMPARISON:  CT abdomen dated 02/20/2016. FINDINGS: Lower chest: No acute findings. Chronic atelectasis at each lung base. Hepatobiliary: No mass visualized within the liver on this un-enhanced exam. Gallbladder is unremarkable. Pancreas: Partially infiltrated with fat. No mass or inflammatory process identified on this un-enhanced exam. Spleen: Within normal limits in size. Adrenals/Urinary Tract: Stable left adrenal mass with CT density measurement compatible with a benign lipid rich adrenal adenoma. Right adrenal gland not well seen but without obvious mass. Stable benign cyst exophytic to the upper pole of the left kidney. Kidneys otherwise unremarkable without stone or hydronephrosis. No ureteral or bladder calculi identified. Bladder appears normal. Stomach/Bowel: Bowel is normal in caliber. Fairly large amount of stool is seen throughout the  nondistended colon. Fairly extensive diverticulosis is seen within the colon but there are no focal inflammatory changes appreciated to suggest acute diverticulitis at this time. No paracolic inflammation. Appendix is not seen. Stomach is unremarkable. Vascular/Lymphatic: Scattered atherosclerotic changes of the normal caliber abdominal aorta and pelvic vasculature. No enlarged lymph nodes appreciated. Reproductive: No mass or other significant abnormality. Other: No abscess collection identified. No free intraperitoneal air. There is, however, subtle fluid-like or inflammatory change within the right upper quadrant adjacent to the gallbladder fossa and anterior to the transverse colon (series 201, images 27 through 30). Musculoskeletal: Degenerative changes throughout the thoracolumbar spine. Old compression fracture deformity of the L1 vertebral body status post vertebroplasty. No acute  or suspicious osseous lesions seen. Superficial soft tissues are unremarkable. IMPRESSION: 1. Subtle low-density material, presumed fluid stranding/inflammation, within the right upper quadrant (just inferior to the gallbladder) and interposed between the anterior abdominal wall and transverse colon. This is of uncertain etiology but new compared to the previous CT suggesting an acute infectious or inflammatory process. Differential possibilities would include occult bowel wall inflammation or diverticulitis, cholecystitis and mesenteritis/panniculitis. Recommend follow-up CT abdomen at some point to ensure resolution and exclude the less likely possibility of neoplastic process. 2. Colonic diverticulosis without evidence of acute diverticulitis. 3. Aortic atherosclerosis. Additional chronic/incidental findings detailed above. Electronically Signed   By: Franki Cabot M.D.   On: 02/23/2016 13:50   US Abdomen Limited Ruq  02/23/2016  CLINICAL DATA:  Fluid around gallbladder on CT EXAM: US ABDOMEN LIMITED - RIGHT UPPER QUADRANT  COMPARISON:  CT 02/23/2016 FINDINGS: Gallbladder: No gallstones or wall thickening visualized. No sonographic Murphy sign noted by sonographer. No pericholecystic fluid is present. Common bile duct: Diameter: Upper limits of normal at 6 mm Liver: No focal lesion identified. Within normal limits in parenchymal echogenicity. IMPRESSION: Gallbladder is normal. Fluid seen on comparison CT is not well demonstrated by ultrasound. Electronically Signed   By: Suzy Bouchard M.D.   On: 02/23/2016 15:22   I have personally reviewed and evaluated these images and lab results as part of my medical decision-making.   EKG Interpretation   Date/Time:  Sunday February 23 2016 09:13:33 EDT Ventricular Rate:  78 PR Interval:  168 QRS Duration: 78 QT Interval:  386 QTC Calculation: 440 R Axis:   19 Text Interpretation:  Normal sinus rhythm Biatrial enlargement Abnormal  ECG Confirmed by Hazle Coca [phone]) on 02/23/2016 10:32:39 AM      MDM   Final diagnoses:  Abdominal pain   Patient here for nausea, decreased appetite, back pain. BMP with mild elevation in creatinine. CT abdomen obtained without contrast given elevation in his creatinine demonstrates nonspecific inflammation, questionable developing infection. Will treat for potential diverticulitis given change in appetite and bowel movements. Providing gentle IV fluid hydration. Plan to admit for observation.    Quintella Reichert, MD 02/23/16 [phone]

## 2016-02-24 DIAGNOSIS — E440 Moderate protein-calorie malnutrition: Secondary | ICD-10-CM | POA: Insufficient documentation

## 2016-02-24 DIAGNOSIS — R64 Cachexia: Secondary | ICD-10-CM | POA: Diagnosis present

## 2016-02-24 DIAGNOSIS — H9190 Unspecified hearing loss, unspecified ear: Secondary | ICD-10-CM | POA: Diagnosis present

## 2016-02-24 DIAGNOSIS — I351 Nonrheumatic aortic (valve) insufficiency: Secondary | ICD-10-CM | POA: Diagnosis present

## 2016-02-24 DIAGNOSIS — E860 Dehydration: Secondary | ICD-10-CM | POA: Diagnosis present

## 2016-02-24 DIAGNOSIS — Z681 Body mass index (BMI) 19 or less, adult: Secondary | ICD-10-CM | POA: Diagnosis not present

## 2016-02-24 DIAGNOSIS — I4892 Unspecified atrial flutter: Secondary | ICD-10-CM | POA: Diagnosis present

## 2016-02-24 DIAGNOSIS — R109 Unspecified abdominal pain: Secondary | ICD-10-CM | POA: Diagnosis present

## 2016-02-24 DIAGNOSIS — K5909 Other constipation: Secondary | ICD-10-CM | POA: Diagnosis not present

## 2016-02-24 DIAGNOSIS — H409 Unspecified glaucoma: Secondary | ICD-10-CM | POA: Diagnosis present

## 2016-02-24 DIAGNOSIS — N179 Acute kidney failure, unspecified: Principal | ICD-10-CM

## 2016-02-24 DIAGNOSIS — Z7901 Long term (current) use of anticoagulants: Secondary | ICD-10-CM | POA: Diagnosis not present

## 2016-02-24 DIAGNOSIS — K5732 Diverticulitis of large intestine without perforation or abscess without bleeding: Secondary | ICD-10-CM | POA: Diagnosis not present

## 2016-02-24 DIAGNOSIS — I483 Typical atrial flutter: Secondary | ICD-10-CM | POA: Diagnosis not present

## 2016-02-24 DIAGNOSIS — E876 Hypokalemia: Secondary | ICD-10-CM | POA: Diagnosis present

## 2016-02-24 LAB — BASIC METABOLIC PANEL
Anion gap: 9 (ref 5–15)
BUN: 19 mg/dL (ref 6–20)
CHLORIDE: 99 mmol/L — AB (ref 101–111)
CO2: 29 mmol/L (ref 22–32)
Calcium: 9.3 mg/dL (ref 8.9–10.3)
Creatinine, Ser: 1.31 mg/dL — ABNORMAL HIGH (ref 0.61–1.24)
GFR calc Af Amer: 54 mL/min — ABNORMAL LOW (ref >60)
GFR, EST NON AFRICAN AMERICAN: 47 mL/min — AB (ref >60)
GLUCOSE: 101 mg/dL — AB (ref 65–99)
Potassium: 3.8 mmol/L (ref 3.5–5.1)
Sodium: 137 mmol/L (ref 135–145)

## 2016-02-24 LAB — CBC
HEMATOCRIT: 35.4 % — AB (ref 39.0–52.0)
HEMOGLOBIN: 11.6 g/dL — AB (ref 13.0–17.0)
MCH: 31.4 pg (ref 26.0–34.0)
MCHC: 32.8 g/dL (ref 30.0–36.0)
MCV: 95.7 fL (ref 78.0–100.0)
Platelets: 204 10*3/uL (ref 150–400)
RBC: 3.7 MIL/uL — ABNORMAL LOW (ref 4.22–5.81)
RDW: 12.3 % (ref 11.5–15.5)
WBC: 5.1 10*3/uL (ref 4.0–10.5)

## 2016-02-24 MED ORDER — ENSURE ENLIVE PO LIQD
237.0000 mL | Freq: Three times a day (TID) | ORAL | Status: DC
  Administered 2016-02-24 – 2016-02-25 (×3): 237 mL via ORAL

## 2016-02-24 MED ORDER — POLYETHYLENE GLYCOL 3350 17 G PO PACK
17.0000 g | PACK | Freq: Every day | ORAL | Status: DC
  Administered 2016-02-24: 17 g via ORAL
  Filled 2016-02-24: qty 1

## 2016-02-24 NOTE — Progress Notes (Signed)
[facility] Progress Note  Patient: Patrick Walker [id number]   PCP: Leonard Downing, MD DOB: 1924-12-21   DOA: 02/23/2016   DOS: 02/24/2016   Date of Service: the patient was seen and examined on 02/24/2016  Subjective: Tolerating oral diet. No abdominal pain while eating. Nutrition: Tolerating oral diet  Brief hospital course: Pt. with PMH of hypothyroidism; admitted on 02/23/2016, with complaint of abdominal pain and generalized weakness, was found to have diverticulitis. Currently further plan is continue antibiotics and monitor for improvement.  Assessment and Plan: 1. Acute kidney injury ([place]) Improving but not significantly. Continue gentle IV hydration. Monitor renal function. Likely prerenal with dehydration.  2. Diverticulitis. Continue Rocephin and Flagyl. Diarrhea with at home which is currently resolved.  3. Atrial flutter. Rate control.  continue anticoagulation.  4. Protein calorie malnutrition. We'll continue nutritional supplementation.  Pain management: When necessary Tylenol Activity: Consulted physical therapy Bowel regimen: last BM 02/23/2016 Diet: Full liquid diet DVT Prophylaxis: on therapeutic anticoagulation.  Advance goals of care discussion: Partial  Family Communication: no family was present at bedside, at the time of interview.   Disposition:  Discharge to E home with home health. Expected discharge date: 02/25/2016, pending tolerating oral diet  Consultants: None Procedures: None  Antibiotics: Anti-infectives    Start     Dose/Rate Route Frequency Ordered Stop   02/24/16 1000  cefTRIAXone (ROCEPHIN) 2 g in dextrose 5 % 50 mL IVPB     2 g 100 mL/hr over 30 Minutes Intravenous Every 24 hours 02/23/16 1707     02/24/16 0000  cefTRIAXone (ROCEPHIN) 2 g in dextrose 5 % 50 mL IVPB  Status:  Discontinued     2 g 100 mL/hr over 30 Minutes Intravenous Every 24 hours 02/23/16 1555 02/23/16 1707   02/23/16 2200  metroNIDAZOLE  (FLAGYL) IVPB 500 mg     500 mg 100 mL/hr over 60 Minutes Intravenous Every 8 hours 02/23/16 1555     02/23/16 1545  cefTRIAXone (ROCEPHIN) 2 g in dextrose 5 % 50 mL IVPB     2 g 100 mL/hr over 30 Minutes Intravenous  Once 02/23/16 1534 02/23/16 1644   02/23/16 1545  metroNIDAZOLE (FLAGYL) IVPB 500 mg     500 mg 100 mL/hr over 60 Minutes Intravenous  Once 02/23/16 1534 02/23/16 1941        Intake/Output Summary (Last 24 hours) at 02/24/16 1959 Last data filed at 02/24/16 1300  Gross per 24 hour  Intake 907.08 ml  Output   1686 ml  Net -778.92 ml   Filed Weights   02/23/16 0915 02/23/16 1712 02/23/16 2020  Weight: 46.72 kg (103 lb) 45.224 kg (99 lb 11.2 oz) 47.492 kg (104 lb 11.2 oz)    Objective: Physical Exam: Filed Vitals:   02/23/16 2020 02/24/16 0543 02/24/16 0839 02/24/16 1744  BP: 144/60 143/62 169/72 129/64  Pulse: 73 70 72 60  Temp: 98.2 F (36.8 C) 98.2 F (36.8 C) 98.1 F (36.7 C) 97.4 F (36.3 C)  TempSrc: Oral Oral Oral Oral  Resp: 16 17 18 18   Height:      Weight: 47.492 kg (104 lb 11.2 oz)     SpO2: 99% 97% 97% 96%    General: Alert, Awake and Oriented to Time, Place and Person. Appear in mild distress Eyes: PERRL, Conjunctiva normal ENT: Oral Mucosa clear moist. Neck: no JVD, no Abnormal Mass Or lumps Cardiovascular: S1 and S2 Present, aortic systolic Murmur, Respiratory: Bilateral Air entry equal and Decreased, noClear to  Auscultation, no Crackles, no wheezes Abdomen: Bowel Sound present, Soft and no tenderness Skin: no redness, no Rash  Extremities: no Pedal edema, no calf tenderness Neurologic: Grossly no focal neuro deficit. Bilaterally Equal motor strength  Data Reviewed: CBC:  Recent Labs Lab 02/20/16 0821 02/23/16 0919 02/24/16 0805  WBC 6.4 5.8 5.1  NEUTROABS 5.0  --   --   HGB 12.4* 11.6* 11.6*  HCT 37.7* 34.7* 35.4*  MCV 96.9 95.6 95.7  PLT 170 182 0000000   Basic Metabolic Panel:  Recent Labs Lab 02/20/16 0821  02/23/16 0919 02/24/16 0805  NA 138 135 137  K 4.6 3.9 3.8  CL 102 98* 99*  CO2 28 27 29   GLUCOSE 119* 112* 101*  BUN 24* 32* 19  CREATININE 1.39* 1.40* 1.31*  CALCIUM 9.3 9.5 9.3    Liver Function Tests:  Recent Labs Lab 02/20/16 0821 02/23/16 0919  AST 19 22  ALT 12* 15*  ALKPHOS 59 59  BILITOT 0.9 0.8  PROT 6.8 7.3  ALBUMIN 3.4* 3.6    Recent Labs Lab 02/23/16 0919  LIPASE 17   No results for input(s): AMMONIA in the last 168 hours. Coagulation Profile: No results for input(s): INR, PROTIME in the last 168 hours. Cardiac Enzymes:  Recent Labs Lab 02/23/16 2004  TROPONINI <0.03   BNP (last 3 results) No results for input(s): PROBNP in the last 8760 hours.  CBG:  Recent Labs Lab 02/23/16 0916  GLUCAP 108*    Studies: No results found.   Scheduled Meds: . apixaban  2.5 mg Oral BID  . cefTRIAXone (ROCEPHIN)  IV  2 g Intravenous Q24H  . diltiazem  240 mg Oral Daily  . feeding supplement (ENSURE ENLIVE)  237 mL Oral TID BM  . metronidazole  500 mg Intravenous Q8H  . polyethylene glycol  17 g Oral Daily   Continuous Infusions:  PRN Meds: acetaminophen **OR** acetaminophen, docusate sodium, ondansetron **OR** ondansetron (ZOFRAN) IV  Time spent: 30 minutes  Author: Berle Mull, MD [facility] Pager: [phone] 02/24/2016 7:59 PM  If 7PM-7AM, please contact night-coverage at [email or web address], password [facility]

## 2016-02-24 NOTE — Evaluation (Signed)
Physical Therapy Evaluation Patient Details Name: Patrick Walker MRN: [id number] DOB: [birth year] Today's Date: 02/24/2016   History of Present Illness  MACADE ROBARTS is a 82 y.o. male with a Past Medical History of atrial flutter, aortic insufficiency, who presents with gen weakness and decrease appetite. AKI with dehydration  Clinical Impression   Pt admitted with above diagnosis. Pt currently with functional limitations due to the deficits listed below (see PT Problem List).  Pt will benefit from skilled PT to increase their independence and safety with mobility to allow discharge to the venue listed below.    Presents with notable mild balance deficits; has a cane at home and tells me he will use it prn     Follow Up Recommendations Home health PT  Possible that pt will decline HHPT    Equipment Recommendations  None recommended by PT    Recommendations for Other Services OT consult     Precautions / Restrictions Precautions Precaution Comments: mild fall risk      Mobility  Bed Mobility Overal bed mobility: Independent                Transfers Overall transfer level: Needs assistance Equipment used: None Transfers: Sit to/from Stand Sit to Stand: Min guard         General transfer comment: Noted pt braced backs of LEs against bed for stabiltiy -- this is indicative of incr fall risk  Ambulation/Gait Ambulation/Gait assistance: Min guard (without physical contact) Ambulation Distance (Feet): 180 Feet Assistive device: None (Occasional use of hallway rails) Gait Pattern/deviations: Step-through pattern;Decreased step length - right;Decreased step length - left;Decreased stride length (trunk flexion present, but slight) Gait velocity: varied   General Gait Details: Initially reaching out for UE support and assist, but progressed to no UE support; short steps, and noted two occasions when momentum increased and velocity increased; Able to stop when cued but  unsteady  Stairs            Wheelchair Mobility    Modified Rankin (Stroke Patients Only)       Balance Overall balance assessment: Needs assistance           Standing balance-Leahy Scale: Fair Standing balance comment: Bracing backs of LEs against bed at initial stance                             Pertinent Vitals/Pain Pain Assessment: No/denies pain    Home Living Family/patient expects to be discharged to:: Private residence Living Arrangements: Spouse/significant other Available Help at Discharge: Family;Available PRN/intermittently Type of Home: House Home Access: Stairs to enter Entrance Stairs-Rails: Right Entrance Stairs-Number of Steps: 5 Home Layout: Two level;Able to live on main level with bedroom/bathroom Home Equipment: Cane - single point      Prior Function Level of Independence: Independent with assistive device(s)         Comments: cane prn, sounds like pretty rarely     Hand Dominance        Extremity/Trunk Assessment   Upper Extremity Assessment: Overall WFL for tasks assessed           Lower Extremity Assessment: Generalized weakness (subjective report of feeling "weak")      Cervical / Trunk Assessment: Normal  Communication   Communication: HOH  Cognition Arousal/Alertness: Awake/alert Behavior During Therapy: WFL for tasks assessed/performed Overall Cognitive Status: Difficult to assess  General Comments General comments (skin integrity, edema, etc.): Pt and family expressed concern re: paying hospital bill    Exercises        Assessment/Plan    PT Assessment Patient needs continued PT services  PT Diagnosis Generalized weakness   PT Problem List Decreased strength;Decreased activity tolerance;Decreased balance;Decreased knowledge of use of DME  PT Treatment Interventions DME instruction;Gait training;Stair training;Functional mobility training;Therapeutic  activities;Therapeutic exercise;Balance training   PT Goals (Current goals can be found in the Care Plan section) Acute Rehab PT Goals Patient Stated Goal: Hopes to get home soon PT Goal Formulation: With patient/family Time For Goal Achievement: 03/02/16 Potential to Achieve Goals: Good    Frequency Min 3X/week   Barriers to discharge        Co-evaluation               End of Session Equipment Utilized During Treatment: Gait belt Activity Tolerance: Patient tolerated treatment well Patient left: in chair;with call bell/phone within reach;with family/visitor present Nurse Communication: Mobility status    Functional Assessment Tool Used: Clinical Judgement Functional Limitation: Mobility: Walking and moving around Mobility: Walking and Moving Around Current Status [phone]): At least 1 percent but less than 20 percent impaired, limited or restricted Mobility: Walking and Moving Around Goal Status [phone]): 0 percent impaired, limited or restricted    Time: 1134-1155 PT Time Calculation (min) (ACUTE ONLY): 21 min   Charges:   PT Evaluation $PT Eval Low Complexity: 1 Procedure     PT G Codes:   PT G-Codes **NOT FOR INPATIENT CLASS** Functional Assessment Tool Used: Clinical Judgement Functional Limitation: Mobility: Walking and moving around Mobility: Walking and Moving Around Current Status [id number]): At least 1 percent but less than 20 percent impaired, limited or restricted Mobility: Walking and Moving Around Goal Status [phone]): 0 percent impaired, limited or restricted    Roney Marion Hamff 02/24/2016, 1:25 PM  Roney Marion, [place] Pager [phone] Office [phone]

## 2016-02-24 NOTE — Care Management Obs Status (Signed)
[place] NOTIFICATION   Patient Details  Name: DERRECK FORTUNA MRN: [id number] Date of Birth: 12-01-1924   Medicare Observation Status Notification Given:  Yes    Kaide Gage, Rory Percy, RN 02/24/2016, 11:28 AM

## 2016-02-24 NOTE — Progress Notes (Signed)
Initial Nutrition Assessment  DOCUMENTATION CODES:   Severe malnutrition in context of chronic illness  INTERVENTION:  Provide Ensure Enlive po TID, each supplement provides 350 kcal and 20 grams of protein.  RD to continue to monitor.   NUTRITION DIAGNOSIS:   Malnutrition related to chronic illness as evidenced by severe depletion of body fat, severe depletion of muscle mass.  GOAL:   Patient will meet greater than or equal to 90% of their needs  MONITOR:   PO intake, Supplement acceptance, Diet advancement, Weight trends, Labs, I & O's  REASON FOR ASSESSMENT:   Consult Assessment of nutrition requirement/status  ASSESSMENT:   17 82 y.o. male with medical history significant for a flutter on Cardizem and Eliquis, bowel disease, aortic insufficiency [place] the emergency department with the chief complaint nausea vomiting decreased appetite constipation generalized weakness. Initial evaluation reveals acute kidney injury dehydration and possible diverticulitis  Meal completion this AM was 25%. Pt reports appetite has been improving and he has been able to keep his foods down. Family at bedside reports pt with a poor appetite with poor po intake over the past 1 week due to n/v. Pt unable to keep most foods down. Usual body weight reported to be ~103 lbs. Weight has been stable. Prior to current acute illness, pt reports eating well with consumption of at least 3 meals daily. Noted pt is a lacto ovo vegetarian. Pt does not eat meat.  Pt currently has Boost Breeze ordered and has been consuming them. RD to order Ensure to aid in caloric and protein needs. Pt and family encouraged to continue nutritional supplementation at home.   Nutrition-Focused physical exam completed. Findings are severe fat depletion, severe muscle depletion, and no edema.   Labs and medications reviewed.   Diet Order:  Diet full liquid Room service appropriate?: Yes; Fluid consistency:: Thin  Skin:   Reviewed, no issues  Last BM:  Unknown  Height:   Ht Readings from Last 1 Encounters:  02/23/16 5\' 1"  (1.549 m)    Weight:   Wt Readings from Last 1 Encounters:  02/23/16 104 lb 11.2 oz (47.492 kg)    Ideal Body Weight:  50.9 kg  BMI:  Body mass index is 19.79 kg/(m^2).  Estimated Nutritional Needs:   Kcal:  1400-1600  Protein:  55-65 grams  Fluid:  >/= 1.5 L/day  EDUCATION NEEDS:   Education needs addressed  Corrin Parker, MS, RD, LDN Pager # [phone] After hours/ weekend pager # [phone]

## 2016-02-25 DIAGNOSIS — R110 Nausea: Secondary | ICD-10-CM

## 2016-02-25 DIAGNOSIS — R531 Weakness: Secondary | ICD-10-CM

## 2016-02-25 DIAGNOSIS — I483 Typical atrial flutter: Secondary | ICD-10-CM

## 2016-02-25 DIAGNOSIS — E860 Dehydration: Secondary | ICD-10-CM

## 2016-02-25 DIAGNOSIS — K5909 Other constipation: Secondary | ICD-10-CM

## 2016-02-25 DIAGNOSIS — E440 Moderate protein-calorie malnutrition: Secondary | ICD-10-CM

## 2016-02-25 LAB — BASIC METABOLIC PANEL
ANION GAP: 10 (ref 5–15)
BUN: 12 mg/dL (ref 6–20)
CO2: 30 mmol/L (ref 22–32)
Calcium: 9.2 mg/dL (ref 8.9–10.3)
Chloride: 101 mmol/L (ref 101–111)
Creatinine, Ser: 1.16 mg/dL (ref 0.61–1.24)
GFR calc Af Amer: >60 mL/min (ref >60)
GFR, EST NON AFRICAN AMERICAN: 54 mL/min — AB (ref >60)
GLUCOSE: 101 mg/dL — AB (ref 65–99)
POTASSIUM: 3.1 mmol/L — AB (ref 3.5–5.1)
Sodium: 141 mmol/L (ref 135–145)

## 2016-02-25 LAB — CBC
HEMATOCRIT: 36.3 % — AB (ref 39.0–52.0)
HEMOGLOBIN: 11.9 g/dL — AB (ref 13.0–17.0)
MCH: 31.2 pg (ref 26.0–34.0)
MCHC: 32.8 g/dL (ref 30.0–36.0)
MCV: 95.3 fL (ref 78.0–100.0)
Platelets: 210 10*3/uL (ref 150–400)
RBC: 3.81 MIL/uL — ABNORMAL LOW (ref 4.22–5.81)
RDW: 12.3 % (ref 11.5–15.5)
WBC: 3.7 10*3/uL — ABNORMAL LOW (ref 4.0–10.5)

## 2016-02-25 LAB — MAGNESIUM: Magnesium: 1.5 mg/dL — ABNORMAL LOW (ref 1.7–2.4)

## 2016-02-25 MED ORDER — POTASSIUM CHLORIDE 20 MEQ/15ML (10%) PO SOLN
40.0000 meq | ORAL | Status: AC
  Administered 2016-02-25: 40 meq via ORAL
  Filled 2016-02-25: qty 30

## 2016-02-25 MED ORDER — CEPHALEXIN 500 MG PO CAPS: 500.0000 mg | ORAL_CAPSULE | Freq: Two times a day (BID) | ORAL | Status: DC

## 2016-02-25 MED ORDER — METRONIDAZOLE 500 MG PO TABS: 500.0000 mg | ORAL_TABLET | Freq: Three times a day (TID) | ORAL | Status: AC

## 2016-02-25 MED ORDER — METRONIDAZOLE 500 MG PO TABS
500.0000 mg | ORAL_TABLET | Freq: Three times a day (TID) | ORAL | Status: DC
  Administered 2016-02-25: 500 mg via ORAL
  Filled 2016-02-25: qty 1

## 2016-02-25 MED ORDER — ENSURE ENLIVE PO LIQD: 237.0000 mL | Freq: Three times a day (TID) | ORAL | Status: DC

## 2016-02-25 MED ORDER — MAGNESIUM SULFATE 2 GM/50ML IV SOLN
2.0000 g | Freq: Once | INTRAVENOUS | Status: AC
  Administered 2016-02-25: 2 g via INTRAVENOUS
  Filled 2016-02-25: qty 50

## 2016-02-25 MED ORDER — POTASSIUM CHLORIDE 20 MEQ/15ML (10%) PO SOLN: 40.0000 meq | Freq: Once | ORAL | Status: DC

## 2016-02-25 MED ORDER — POLYETHYLENE GLYCOL 3350 17 G PO PACK: 17.0000 g | PACK | Freq: Every day | ORAL | Status: DC

## 2016-02-25 NOTE — Progress Notes (Signed)
Pt's manual blood pressure was 172/80. Tylene Fantasia, NP notified. Will continue to monitor the patient closely.   Shelbie Hutching, RN, BSN

## 2016-02-25 NOTE — Discharge Instructions (Signed)

## 2016-02-25 NOTE — Care Management Important Message (Signed)
Important Message  Patient Details  Name: Patrick Walker MRN: [id number] Date of Birth: [birth year]   Medicare Important Message Given:  Yes    Loann Quill 02/25/2016, 8:16 AM

## 2016-02-25 NOTE — Evaluation (Signed)
Occupational Therapy Evaluation Patient Details Name: Patrick Walker MRN: [id number] DOB: 08/13/1925 Today's Date: 02/25/2016    History of Present Illness Patrick Walker is a 82 y.o. male with a Past Medical History of atrial flutter, aortic insufficiency, who presents with gen weakness and decrease appetite. AKI with dehydration   Clinical Impression   Pt doing well and is at min guard A - sup level with ADLs due to balance deficits. No further acute OT indicated at this time, pt to continue with acute PT services for functional mobility, balance and safety    Follow Up Recommendations  No OT follow up;Supervision - Intermittent    Equipment Recommendations  None recommended by OT    Recommendations for Other Services       Precautions / Restrictions Precautions Precaution Comments: mild fall risk Restrictions Weight Bearing Restrictions: No      Mobility Bed Mobility Overal bed mobility: Independent                Transfers Overall transfer level: Needs assistance Equipment used: None Transfers: Sit to/from Stand Sit to Stand: Min guard              Balance Overall balance assessment: Needs assistance Sitting-balance support: No upper extremity supported;Feet supported Sitting balance-Leahy Scale: Good     Standing balance support: No upper extremity supported;During functional activity Standing balance-Leahy Scale: Fair                              ADL Overall ADL's : Needs assistance/impaired     Grooming: Wash/dry hands;Wash/dry face;Standing;Supervision/safety   Upper Body Bathing: Standing;Supervision/ safety   Lower Body Bathing: Supervison/ safety;Sit to/from stand   Upper Body Dressing : Standing;Supervision/safety   Lower Body Dressing: Sit to/from stand;Supervision/safety   Toilet Transfer: Grab bars;Regular Toilet;Min guard   Toileting- Clothing Manipulation and Hygiene: Supervision/safety;Sit to/from stand   Tub/  Shower Transfer: Min guard   Functional mobility during ADLs: Supervision/safety;Min guard       Vision  no change from baseline              Pertinent Vitals/Pain Pain Assessment: No/denies pain     Hand Dominance Right   Extremity/Trunk Assessment Upper Extremity Assessment Upper Extremity Assessment: Generalized weakness   Lower Extremity Assessment Lower Extremity Assessment: Defer to PT evaluation   Cervical / Trunk Assessment Cervical / Trunk Assessment: Normal   Communication Communication Communication: HOH   Cognition Arousal/Alertness: Awake/alert Behavior During Therapy: WFL for tasks assessed/performed Overall Cognitive Status: Within Functional Limits for tasks assessed                     General Comments   Pt very pleasant and cooperative                 Home Living Family/patient expects to be discharged to:: Private residence Living Arrangements: Spouse/significant other Available Help at Discharge: Family;Available PRN/intermittently Type of Home: House Home Access: Stairs to enter [employer] of Steps: 5 Entrance Stairs-Rails: Right Home Layout: Two level;Able to live on main level with bedroom/bathroom     Bathroom Shower/Tub: Occupational psychologist: Standard     Home Equipment: Cane - single point          Prior Functioning/Environment Level of Independence: Independent with assistive device(s)        Comments: cane prn    OT Diagnosis: Generalized weakness  OT Problem List: Decreased activity tolerance;Impaired balance (sitting and/or standing)   OT Treatment/Interventions:      OT Goals(Current goals can be found in the care plan section) Acute Rehab OT Goals Patient Stated Goal: go home soon  OT Frequency:     Barriers to D/C:    none                     End of Session    Activity Tolerance: Patient tolerated treatment well Patient left: in bed;with call bell/phone  within reach;with family/visitor present   Time: 0920-0939 OT Time Calculation (min): 19 min Charges:  OT General Charges $OT Visit: 1 Procedure OT Evaluation $OT Eval Moderate Complexity: 1 Procedure G-Codes:    Britt Bottom 02/25/2016, 10:54 AM

## 2016-02-25 NOTE — Discharge Summary (Signed)
[facility] Discharge Summary   Patient: Patrick Walker [id number]   PCP: Leonard Downing, MD DOB: Nov 22, 1924   Date of admission: 02/23/2016   Date of discharge:  02/25/2016    Discharge Diagnoses:  Principal Problem:   Acute kidney injury ([place]) Active Problems:   Atrial flutter ([facility])   TSH (thyroid-stimulating hormone deficiency)   Diverticulitis   Dehydration   Constipation   Nausea   Generalized weakness   Malnutrition of moderate degree  Admitted From: Home Disposition:  Home with home health  Recommendations for Outpatient Follow-up:  1. Please follow-up with PCP in one week. 2. Please take soft diet for one week. 3. Please continue to take stool softener MiraLAX for at least a week on a scheduled basis and as needed thereafter 1 regular daily bowel movement.   Follow-up Information    Follow up with Leonard Downing, MD. Schedule an appointment as soon as possible for a visit in 1 week.   Specialty:  Family Medicine   Contact information:   [address] [id number] [phone]      Diet recommendation: Soft diet  Activity: The patient is advised to gradually reintroduce usual activities.  Discharge Condition: good  Code Status: Partial code, no CPR, no intubation, okay with defibrillation, ACLS medications, NIV  History of present illness: As per the H and P dictated on admission, "J C Peabody is a very pleasant very HOH 82 y.o. male with medical history significant for a flutter on Cardizem and Eliquis, bowel disease, aortic insufficiency Zosyn the emergency department with the chief complaint nausea vomiting decreased appetite constipation generalized weakness. Initial evaluation reveals acute kidney injury dehydration and possible diverticulitis  Information is obtained from the son is at the bedside patient is a very poor historian and very hard of hearing. Reports the last several days patient has had a decreased oral intake  due to complaints of nausea. No emesis noted. He also complains of constipation generalized weakness. He reports some general low back pain last week went to see his PCP and was started on tramadol. He believes his nausea constipation began after starting tramadol. He denies chest pain palpitation headache dizziness syncope or near-syncope. He denies any abdominal pain dysuria hematuria frequency or urgency. He denies any fever chills recent sick contacts. He denies cough lower extremity edema or orthopnea."  Hospital Course:  Summary of his active problems in the hospital is as following. 1. Acute kidney injury ([place]) Improved significantly. And encourage oral hydration at home. Check BMP in 1 week with PCP  Likely prerenal with dehydration.  2. Diverticulitis. Continue Keflex and Flagyl. Finish a total of 10 day treatment course. Recommend to treat constipation with stool softener MiraLAX.  3. Atrial flutter. Rate control.  continue anticoagulation.  4. Protein calorie malnutrition. We'll continue nutritional supplementation.  5. Hypokalemia. Hypomagnesemia. Replacing. recheck in 1 week with PCP.  6. Constipation. Severe, CT abdomen showed just large amount of stool throughout colon Continue MiraLAX scheduled on discharge.   All other chronic medical condition were stable during the hospitalization.  Patient was seen by physical therapy, who recommended home health which was arranged by Education officer, museum and case Freight forwarder. On the day of the discharge the patient's vitals were stable and patient was able to tolerate soft food, and no other acute medical condition were reported by patient. the patient was felt safe to be discharge at home with home health.  Procedures and Results:  none   Consultations:  none  DISCHARGE MEDICATION: Current Discharge Medication List    START taking these medications   Details  cephALEXin (KEFLEX) 500 MG capsule Take 1 capsule (500 mg total) by  mouth 2 (two) times daily. Qty: 14 capsule, Refills: 0    feeding supplement, ENSURE ENLIVE, (ENSURE ENLIVE) LIQD Take 237 mLs by mouth 3 (three) times daily between meals. Qty: 237 mL, Refills: 12    metroNIDAZOLE (FLAGYL) 500 MG tablet Take 1 tablet (500 mg total) by mouth 3 (three) times daily. Qty: 21 tablet, Refills: 0    polyethylene glycol (MIRALAX) packet Take 17 g by mouth daily. Qty: 30 each, Refills: 0      CONTINUE these medications which have NOT CHANGED   Details  apixaban (ELIQUIS) 2.5 MG TABS tablet Take 2.5 mg by mouth 2 (two) times daily.    diltiazem (CARDIZEM CD) 240 MG 24 hr capsule Take 1 capsule (240 mg total) by mouth daily. Qty: 90 capsule, Refills: 3    docusate sodium (COLACE) 100 MG capsule Take 100 mg by mouth daily as needed for mild constipation.    traMADol (ULTRAM) 50 MG tablet Take 50 mg by mouth every 6 (six) hours as needed.      STOP taking these medications     loperamide (IMODIUM) 2 MG capsule        No Known Allergies Discharge Instructions    DIET SOFT    Complete by:  As directed      Discharge instructions    Complete by:  As directed   It is important that you read following instructions as well as go over your medication list with RN to help you understand your care after this hospitalization.  Discharge Instructions: Please follow-up with PCP in one week  Please request your primary care physician to go over all Hospital Tests and Procedure/Radiological results at the follow up,  Please get all Hospital records sent to your PCP by signing hospital release before you go home.   Do not take more than prescribed Pain, Sleep and Anxiety Medications. You were cared for by a hospitalist during your hospital stay. If you have any questions about your discharge medications or the care you received while you were in the hospital after you are discharged, you can call the unit and ask to speak with the hospitalist on call if the  hospitalist that took care of you is not available.  Once you are discharged, your primary care physician will handle any further medical issues. Please note that NO REFILLS for any discharge medications will be authorized once you are discharged, as it is imperative that you return to your primary care physician (or establish a relationship with a primary care physician if you do not have one) for your aftercare needs so that they can reassess your need for medications and monitor your lab values. You Must read complete instructions/literature along with all the possible adverse reactions/side effects for all the Medicines you take and that have been prescribed to you. Take any new Medicines after you have completely understood and accept all the possible adverse reactions/side effects. Wear Seat belts while driving.     Increase activity slowly    Complete by:  As directed           Discharge Exam: Filed Weights   02/23/16 1712 02/23/16 2020 02/24/16 2023  Weight: 45.224 kg (99 lb 11.2 oz) 47.492 kg (104 lb 11.2 oz) 48.172 kg (106 lb 3.2 oz)   Filed Vitals:  02/25/16 0542 02/25/16 0947  BP: 172/80 159/78  Pulse:  68  Temp:  97.7 F (36.5 C)  Resp:  18   General: Appear in no distress, no Rash; Oral Mucosa moist. Cardiovascular: S1 and S2 Present, aortic systolic Murmur, no JVD Respiratory: Bilateral Air entry present and Clear to Auscultation, no Crackles, no wheezes Abdomen: Bowel Sound present, Soft and no tenderness Extremities: no Pedal edema, no calf tenderness Neurology: Grossly no focal neuro deficit.  The results of significant diagnostics from this hospitalization (including imaging, microbiology, ancillary and laboratory) are listed below for reference.    Significant Diagnostic Studies: Ct Abdomen Pelvis Wo Contrast  02/23/2016  CLINICAL DATA:  Weakness, decreased appetite due to nausea. Back pain. EXAM: CT ABDOMEN AND PELVIS WITHOUT CONTRAST TECHNIQUE: Multidetector  CT imaging of the abdomen and pelvis was performed following the standard protocol without IV contrast. COMPARISON:  CT abdomen dated 02/20/2016. FINDINGS: Lower chest: No acute findings. Chronic atelectasis at each lung base. Hepatobiliary: No mass visualized within the liver on this un-enhanced exam. Gallbladder is unremarkable. Pancreas: Partially infiltrated with fat. No mass or inflammatory process identified on this un-enhanced exam. Spleen: Within normal limits in size. Adrenals/Urinary Tract: Stable left adrenal mass with CT density measurement compatible with a benign lipid rich adrenal adenoma. Right adrenal gland not well seen but without obvious mass. Stable benign cyst exophytic to the upper pole of the left kidney. Kidneys otherwise unremarkable without stone or hydronephrosis. No ureteral or bladder calculi identified. Bladder appears normal. Stomach/Bowel: Bowel is normal in caliber. Fairly large amount of stool is seen throughout the nondistended colon. Fairly extensive diverticulosis is seen within the colon but there are no focal inflammatory changes appreciated to suggest acute diverticulitis at this time. No paracolic inflammation. Appendix is not seen. Stomach is unremarkable. Vascular/Lymphatic: Scattered atherosclerotic changes of the normal caliber abdominal aorta and pelvic vasculature. No enlarged lymph nodes appreciated. Reproductive: No mass or other significant abnormality. Other: No abscess collection identified. No free intraperitoneal air. There is, however, subtle fluid-like or inflammatory change within the right upper quadrant adjacent to the gallbladder fossa and anterior to the transverse colon (series 201, images 27 through 30). Musculoskeletal: Degenerative changes throughout the thoracolumbar spine. Old compression fracture deformity of the L1 vertebral body status post vertebroplasty. No acute or suspicious osseous lesions seen. Superficial soft tissues are unremarkable.  IMPRESSION: 1. Subtle low-density material, presumed fluid stranding/inflammation, within the right upper quadrant (just inferior to the gallbladder) and interposed between the anterior abdominal wall and transverse colon. This is of uncertain etiology but new compared to the previous CT suggesting an acute infectious or inflammatory process. Differential possibilities would include occult bowel wall inflammation or diverticulitis, cholecystitis and mesenteritis/panniculitis. Recommend follow-up CT abdomen at some point to ensure resolution and exclude the less likely possibility of neoplastic process. 2. Colonic diverticulosis without evidence of acute diverticulitis. 3. Aortic atherosclerosis. Additional chronic/incidental findings detailed above. Electronically Signed   By: Franki Cabot M.D.   On: 02/23/2016 13:50   Ct Renal Stone Study  02/20/2016  CLINICAL DATA:  Right flank and back pain for 4 days, worsening. EXAM: CT ABDOMEN AND PELVIS WITHOUT CONTRAST TECHNIQUE: Multidetector CT imaging of the abdomen and pelvis was performed following the standard protocol without IV contrast. COMPARISON:  None. FINDINGS: Mild bibasilar atelectasis is seen. No pleural or pericardial effusion. There is no hydronephrosis on the right or left. No urinary tract stones are identified. The prostate gland is mildly enlarged. The urinary bladder is nearly  completely decompressed but otherwise unremarkable. Small bilateral renal cysts are noted. The liver, gallbladder, spleen, pancreas and adrenal glands appear normal. Aortoiliac atherosclerosis without aneurysm is identified. There is a small volume of simple appearing free pelvic fluid. No focal fluid collection is present. There is no lymphadenopathy. Diverticulosis of the descending and sigmoid colon without diverticulitis is identified. The colon is otherwise unremarkable. The stomach and small bowel appear normal. The patient is status post L1 vertebral augmentation.  Degenerative change is present about the hips and lumbar spine. No lytic or sclerotic bony lesion. IMPRESSION: Negative for urinary tract stone.  No hydronephrosis. Small volume of free pelvic fluid is nonspecific but may be due to gastroenteritis. Cause for the fluid is not identified. Diverticulosis without diverticulitis. Mild prostatomegaly. Atherosclerosis. Degenerative change about the hips and lumbar spine. Electronically Signed   By: Inge Rise M.D.   On: 02/20/2016 09:41   US Abdomen Limited Ruq  02/23/2016  CLINICAL DATA:  Fluid around gallbladder on CT EXAM: US ABDOMEN LIMITED - RIGHT UPPER QUADRANT COMPARISON:  CT 02/23/2016 FINDINGS: Gallbladder: No gallstones or wall thickening visualized. No sonographic Murphy sign noted by sonographer. No pericholecystic fluid is present. Common bile duct: Diameter: Upper limits of normal at 6 mm Liver: No focal lesion identified. Within normal limits in parenchymal echogenicity. IMPRESSION: Gallbladder is normal. Fluid seen on comparison CT is not well demonstrated by ultrasound. Electronically Signed   By: Suzy Bouchard M.D.   On: 02/23/2016 15:22    Microbiology: No results found for this or any previous visit (from the past 240 hour(s)).   Labs: CBC:  Recent Labs Lab 02/20/16 0821 02/23/16 0919 02/24/16 0805 02/25/16 0622  WBC 6.4 5.8 5.1 3.7*  NEUTROABS 5.0  --   --   --   HGB 12.4* 11.6* 11.6* 11.9*  HCT 37.7* 34.7* 35.4* 36.3*  MCV 96.9 95.6 95.7 95.3  PLT 170 182 204 [id number]   Basic Metabolic Panel:  Recent Labs Lab 02/20/16 0821 02/23/16 0919 02/24/16 0805 02/25/16 0622  NA 138 135 137 141  K 4.6 3.9 3.8 3.1*  CL 102 98* 99* 101  CO2 28 27 29 30   GLUCOSE 119* 112* 101* 101*  BUN 24* 32* 19 12  CREATININE 1.39* 1.40* 1.31* 1.16  CALCIUM 9.3 9.5 9.3 9.2  MG  --   --   --  1.5*   Liver Function Tests:  Recent Labs Lab 02/20/16 0821 02/23/16 0919  AST 19 22  ALT 12* 15*  ALKPHOS 59 59  BILITOT 0.9 0.8    PROT 6.8 7.3  ALBUMIN 3.4* 3.6    Recent Labs Lab 02/23/16 0919  LIPASE 17   No results for input(s): AMMONIA in the last 168 hours. Cardiac Enzymes:  Recent Labs Lab 02/23/16 2004  TROPONINI <0.03   BNP (last 3 results) No results for input(s): BNP in the last 8760 hours. CBG:  Recent Labs Lab 02/23/16 0916  GLUCAP 108*   Time spent: 30 minutes  Signed:  PATEL, PRANAV  [facility]  02/25/2016  , 1:57 PM

## 2016-02-25 NOTE — Care Management Note (Signed)
Case Management Note  Patient Details  Name: Patrick Walker MRN: [id number] Date of Birth: 1925/06/24  Subjective/Objective:             CM following for progression and d/c planning.       Action/Plan: 02/25/2016 Met with pt son to discuss HHPT needs, pt is ambulatory at home and in the room with min assist. Spoke with several [facility] agencies and we are unable to arrange [facility] services for this pt as his MD will not sign orders for the [facility] agency, therefore they can not get reimbursed. This was explained to the pt son , who stated that the pt is ambulatory at home. No DME needs per pt son.   Expected Discharge Date:  02/26/2016              Expected Discharge Plan:  [place]  In-House Referral:  NA  Discharge planning Services  CM Consult  Post Acute Care Choice:    Choice offered to:  Adult Children  DME Arranged:    DME Agency:     HH Arranged:   HH Agency:     Status of Service:  Completed.   If discussed at [place] of Stay Meetings, dates discussed:    Additional Comments:  Adron Bene, RN 02/25/2016, 3:14 PM

## 2016-02-25 NOTE — Progress Notes (Signed)
02/25/2016 3:38 PM  J Donald Prose to be D/C'd Home per MD order.  Discussed prescriptions and follow up appointments with the patient. Prescriptions given to patient, medication list explained in detail. Pt verbalized understanding.    Medication List    STOP taking these medications        loperamide 2 MG capsule  Commonly known as:  IMODIUM      TAKE these medications        cephALEXin 500 MG capsule  Commonly known as:  KEFLEX  Take 1 capsule (500 mg total) by mouth 2 (two) times daily.     diltiazem 240 MG 24 hr capsule  Commonly known as:  CARDIZEM CD  Take 1 capsule (240 mg total) by mouth daily.     docusate sodium 100 MG capsule  Commonly known as:  COLACE  Take 100 mg by mouth daily as needed for mild constipation.     ELIQUIS 2.5 MG Tabs tablet  Generic drug:  apixaban  Take 2.5 mg by mouth 2 (two) times daily.     feeding supplement (ENSURE ENLIVE) Liqd  Take 237 mLs by mouth 3 (three) times daily between meals.     metroNIDAZOLE 500 MG tablet  Commonly known as:  FLAGYL  Take 1 tablet (500 mg total) by mouth 3 (three) times daily.     polyethylene glycol packet  Commonly known as:  MIRALAX  Take 17 g by mouth daily.     traMADol 50 MG tablet  Commonly known as:  ULTRAM  Take 50 mg by mouth every 6 (six) hours as needed.        Filed Vitals:   02/25/16 0542 02/25/16 0947  BP: 172/80 159/78  Pulse:  68  Temp:  97.7 F (36.5 C)  Resp:  18    Skin clean, dry and intact without evidence of skin break down, no evidence of skin tears noted. IV catheter discontinued intact. Site without signs and symptoms of complications. Dressing and pressure applied. Pt denies pain at this time. No complaints noted.  An After Visit Summary was printed and given to the patient. Patient escorted via [place], and D/C home via private auto.  Haywood Lasso BSN, RN [facility] Phone [phone]

## 2016-03-10 ENCOUNTER — Telehealth: Payer: Self-pay | Admitting: Oncology

## 2016-03-10 NOTE — Telephone Encounter (Signed)
Appointment made with the patient's daughter in law with [place] on 7/20. She will let her husband, the patient's son Elta [place], aware of the appointment.

## 2016-03-19 ENCOUNTER — Ambulatory Visit: Payer: Medicare Other | Admitting: Oncology

## 2016-03-20 ENCOUNTER — Other Ambulatory Visit: Payer: Self-pay | Admitting: Gastroenterology

## 2016-03-20 DIAGNOSIS — R195 Other fecal abnormalities: Secondary | ICD-10-CM

## 2016-03-26 ENCOUNTER — Ambulatory Visit
Admission: RE | Admit: 2016-03-26 | Discharge: 2016-03-26 | Disposition: A | Discharge status: Home / Self Care | Payer: Medicare Other | Source: Ambulatory Visit | Attending: Gastroenterology | Admitting: Gastroenterology

## 2016-03-26 DIAGNOSIS — R195 Other fecal abnormalities: Secondary | ICD-10-CM

## 2016-03-27 ENCOUNTER — Ambulatory Visit (HOSPITAL_BASED_OUTPATIENT_CLINIC_OR_DEPARTMENT_OTHER): Payer: Medicare Other | Admitting: Oncology

## 2016-03-27 VITALS — BP 124/61 | HR 63 | Temp 97.5°F | Resp 18 | Ht 61.0 in | Wt 96.5 lb

## 2016-03-27 DIAGNOSIS — E440 Moderate protein-calorie malnutrition: Secondary | ICD-10-CM

## 2016-03-27 DIAGNOSIS — D649 Anemia, unspecified: Secondary | ICD-10-CM | POA: Diagnosis present

## 2016-03-27 DIAGNOSIS — E039 Hypothyroidism, unspecified: Secondary | ICD-10-CM

## 2016-03-27 DIAGNOSIS — R531 Weakness: Secondary | ICD-10-CM

## 2016-03-27 DIAGNOSIS — R197 Diarrhea, unspecified: Secondary | ICD-10-CM

## 2016-03-27 DIAGNOSIS — R634 Abnormal weight loss: Secondary | ICD-10-CM

## 2016-03-27 DIAGNOSIS — N179 Acute kidney failure, unspecified: Secondary | ICD-10-CM

## 2016-03-27 NOTE — Progress Notes (Signed)
Reason for Referral: Anemia.   HPI: Patrick Walker is a pleasant 82 year old gentleman with history of irritable bowel disease and hypothyroidism. He is in generally good health and continues to live independently with his wife. He was hospitalized in June 2017 after presenting with symptoms of generalized weakness and decreased appetite. He was hospitalized briefly between 02/23/2016 and 02/25/2016. During that hospitalization his hemoglobin have noted that range between 11.6 and 12.4. He had a normal white cell count, platelets and white cell count differential. Basis CBC back in 2010 his hemoglobin was 12.3. Repeat CBC on 02/27/2016 showed a hemoglobin of 11.9. His white cell count and platelet count as well as differential remains normal. He is creatinine clearance was around 54 mL/m with a creatinine of 1.2. He also has miscellaneous other GI complaints including abdominal distention and change in his bowel habits. CT scan obtained on 02/25/2016 showed no clear cut abnormalities but did show some diverticulosis and possible colonic thickening. He is followed by gastroenterology but have not had a colonoscopy at this time. Clinically, he reports a vague complaints of fatigue and also have had was loss. He reports diarrhea when he eats and that is the main reason he consumes less food. Despite that, he remains active and performs activities of daily living. He continues to drive without any difficulties.  He does not report any headaches or blurry vision, syncope or seizures. He does not report any fevers, chills or sweats. He does not report any cough, wheezing or hemoptysis. He does not report any nausea, vomiting, abdominal pain but does report diarrhea. He denied any hematochezia or melena. He does not report any frequency urgency or hesitancy. Remaining review of systems unremarkable.   Past Medical History:  Diagnosis Date  . Aortic insufficiency    Mild, echo, November, 2011  . Aortic valve  sclerosis    Echo, November, 2011  . Arthritis   . Atrial flutter [facility])    New diagnosis November, 2012  . Difficulty in urination   . Ejection fraction    EF 55-60%, echo, November, 2012 ( rapid atrial flutter at that time)  . Glaucoma   . Hearing loss   . Irritable bowel disease   . Shortness of breath    November, 2012  . Sinus bradycardia   . TSH (thyroid-stimulating hormone deficiency)    TSH 6.4,  November, 2012  :  Past Surgical History:  Procedure Laterality Date  . BACK SURGERY  2008-2009  . HAND SURGERY  1984-85  . HERNIA REPAIR  03/02/2011  . LEG SURGERY  1972  :   Current Outpatient Prescriptions:  .  apixaban (ELIQUIS) 2.5 MG TABS tablet, Take 2.5 mg by mouth 2 (two) times daily., Disp: , Rfl:  .  diltiazem (CARDIZEM CD) 240 MG 24 hr capsule, Take 1 capsule (240 mg total) by mouth daily., Disp: 90 capsule, Rfl: 3 .  docusate sodium (COLACE) 100 MG capsule, Take 100 mg by mouth daily as needed for mild constipation., Disp: , Rfl:  .  traMADol (ULTRAM) 50 MG tablet, Take 50 mg by mouth every 6 (six) hours as needed., Disp: , Rfl:  .  feeding supplement, ENSURE ENLIVE, (ENSURE ENLIVE) LIQD, Take 237 mLs by mouth 3 (three) times daily between meals. (Patient not taking: Reported on 03/27/2016), Disp: 237 mL, Rfl: 12 .  polyethylene glycol (MIRALAX) packet, Take 17 g by mouth daily. (Patient not taking: Reported on 03/27/2016), Disp: 30 each, Rfl: 0:  No Known Allergies:  No family  history on file.:  Social History   Social History  . Marital status: Married    Spouse name: N/A  . Number of children: 3  . Years of education: N/A   Occupational History  . retired Psychologist, sport and exercise    Social History Main Topics  . Smoking status: Never Smoker  . Smokeless tobacco: Not on file  . Alcohol use No  . Drug use: No  . Sexual activity: Not on file   Other Topics Concern  . Not on file   Social History Narrative  . No narrative on file  :  Pertinent items are noted in  HPI.  Exam: Blood pressure 124/61, pulse 63, temperature 97.5 F (36.4 C), temperature source Oral, resp. rate 18, height 5\' 1"  (1.549 m), weight 96 lb 8 oz (43.8 kg), SpO2 100 %. General appearance: alert and cooperative Head: Normocephalic, without obvious abnormality Throat: lips, mucosa, and tongue normal; teeth and gums normal Neck: no adenopathy Back: negative Resp: clear to auscultation bilaterally Cardio: regular rate and rhythm, S1, S2 normal, no murmur, click, rub or gallop GI: soft, non-tender; bowel sounds normal; no masses,  no organomegaly Extremities: extremities normal, atraumatic, no cyanosis or edema Lymph nodes: Cervical, supraclavicular, and axillary nodes normal.  CBC    Component Value Date/Time   WBC 3.7 (L) 02/25/2016 0622   RBC 3.81 (L) 02/25/2016 0622   HGB 11.9 (L) 02/25/2016 0622   HCT 36.3 (L) 02/25/2016 0622   PLT 210 02/25/2016 0622   MCV 95.3 02/25/2016 0622   MCV 100.3 (A) 02/15/2014 1856   MCH 31.2 02/25/2016 0622   MCHC 32.8 02/25/2016 0622   RDW 12.3 02/25/2016 0622   LYMPHSABS 0.5 (L) 02/20/2016 0821   MONOABS 0.9 02/20/2016 0821   EOSABS 0.0 02/20/2016 0821   BASOSABS 0.0 02/20/2016 0821      Chemistry      Component Value Date/Time   NA 141 02/25/2016 0622   K 3.1 (L) 02/25/2016 0622   CL 101 02/25/2016 0622   CO2 30 02/25/2016 0622   BUN 12 02/25/2016 0622   CREATININE 1.16 02/25/2016 0622   CREATININE 1.16 02/15/2014 1856      Component Value Date/Time   CALCIUM 9.2 02/25/2016 0622   ALKPHOS 59 02/23/2016 0919   AST 22 02/23/2016 0919   ALT 15 (L) 02/23/2016 0919   BILITOT 0.8 02/23/2016 0919       Ct Abdomen Pelvis Wo Contrast  Result Date: 03/26/2016 CLINICAL DATA:  Blood in stool / loose bowels several years No hx of ca No surgery EXAM: CT ABDOMEN AND PELVIS WITHOUT CONTRAST TECHNIQUE: Multidetector CT imaging of the abdomen and pelvis was performed following the standard protocol without IV contrast. COMPARISON:   Ultrasound and CT 02/23/2016 FINDINGS: Lower chest: Minimal atelectasis at the right lung base. Heart size is normal. No imaged pericardial effusion or significant coronary artery calcifications. Hepatobiliary: The noncontrast appearance of liver is homogeneous. The gallbladder is decompressed. No pericholecystic fluid is identified by CT best seen on the previous study. Pancreas: Normal in appearance. Spleen: Normal in appearance. Renal/Adrenal: Stable left adrenal myelolipoma measuring 1.8 x 3.1 cm. Right adrenal gland is normal in appearance. Bilateral renal cysts are present. No hydronephrosis. Gastrointestinal tract: Stomach and small bowel loops are normal in appearance. There are numerous colonic diverticula. No acute diverticulitis identified. Within the proximal sigmoid colon there is a focal area of wall thickening, closely associated numerous colonic diverticula. Findings best identified on image 53 of series 2. Considerations include  diverticulosis or malignancy. Recommend followup. Appendix is not well seen and may be obscured or absent. Reproductive/Pelvis: Urinary bladder and prostate gland are normal in appearance. Vascular/Lymphatic: There is atherosclerotic calcification of the abdominal aorta. No aneurysm. No retroperitoneal or mesenteric adenopathy. Musculoskeletal/Abdominal wall: Unremarkable. Other: Significant degenerative changes in the lumbar spine. Prior kyphoplasty at L1. No suspicious lytic or blastic lesions are identified. IMPRESSION: 1. Colonic diverticulosis. 2. Focal wall thickening in the proximal sigmoid colon raising the question of diverticulosis or malignancy. Recommend followup CT or direct colonoscopy. 3. Stable benign left adrenal nodule. 4. Interval resolution of perihepatic fluid. Electronically Signed   By: Nolon Nations M.D.   On: 03/26/2016 09:19   Assessment and Plan:   82 year old gentleman with the following issues:  1. Normocytic, normochromic anemia: His  hemoglobin have ranged between 11.9-12.4 and the last 10 years. He has normal indices and normal white cell count and platelet count. The differential diagnosis for his mild anemia was reviewed today. Causes would include early myelodysplasia, anemia of chronic disease, anemia of renal disease, or normal aging process.  Given his mild degree of anemia and the chronic nature of this anemia it is likely physiological bone marrow aging process and less likely pathological in nature.  Further hematological workup will less likely yield any clear-cut abnormalities and I recommended continued monitoring at this time. He is asymptomatic from this anemia and any symptoms he develops at this time is unlikely to be related to this mild, long-standing anemia.  2. Weight loss and GI symptoms: No clear-cut malignancy noted on his CT scan of the abdomen and pelvis. He had imaging studies on 03/26/2016 which I personally reviewed and discussed with the patient and his son. He does have colonic thickening I have recommended colonoscopy if his symptoms persisted.  No routine follow-up will be set up for him at this time but I'm happy to see him in the future as needed.

## 2016-05-26 ENCOUNTER — Other Ambulatory Visit: Payer: Self-pay | Admitting: Family Medicine

## 2016-05-26 ENCOUNTER — Ambulatory Visit
Admission: RE | Admit: 2016-05-26 | Discharge: 2016-05-26 | Disposition: A | Discharge status: Home / Self Care | Payer: Medicare Other | Source: Ambulatory Visit | Attending: Family Medicine | Admitting: Family Medicine

## 2016-05-26 DIAGNOSIS — M25511 Pain in right shoulder: Principal | ICD-10-CM

## 2016-05-26 DIAGNOSIS — G8911 Acute pain due to trauma: Secondary | ICD-10-CM

## 2016-05-27 ENCOUNTER — Ambulatory Visit
Admission: RE | Admit: 2016-05-27 | Discharge: 2016-05-27 | Disposition: A | Discharge status: Home / Self Care | Payer: Medicare Other | Source: Ambulatory Visit | Attending: Family Medicine | Admitting: Family Medicine

## 2016-05-27 ENCOUNTER — Other Ambulatory Visit: Payer: Self-pay | Admitting: Family Medicine

## 2016-05-27 DIAGNOSIS — T1490XA Injury, unspecified, initial encounter: Secondary | ICD-10-CM

## 2016-09-03 ENCOUNTER — Other Ambulatory Visit: Payer: Self-pay | Admitting: Cardiology

## 2016-09-14 IMAGING — CT CT ABD-PELV W/O CM
2 of 4 series · 16 of 46 positions shown, 18 images · non-contrast
Comparison: Ultrasound and CT 02/23/2016

CLINICAL DATA: Blood in stool / loose bowels several years No hx of
ca No surgery

EXAM:
CT ABDOMEN AND PELVIS WITHOUT CONTRAST
TECHNIQUE: Multidetector CT imaging of the abdomen and pelvis was performed
following the standard protocol without IV contrast.

[Series 2: abd/pelvis w/(date) · axial · 0.58mm/px · z∈[+614,+959]mm · 13 of 76 slices shown, 15 images]
[im 4/76  soft-tissue]
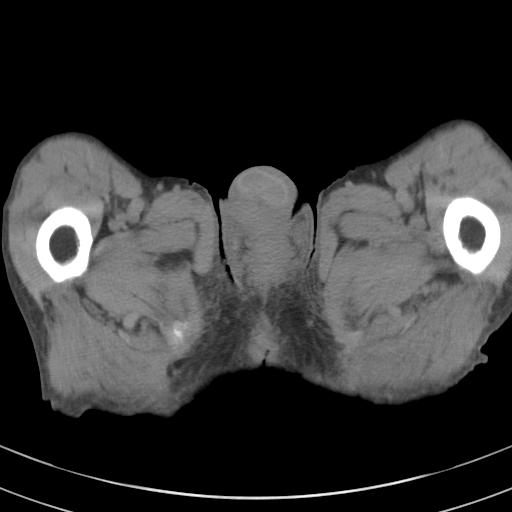
[im 4/76  bone]
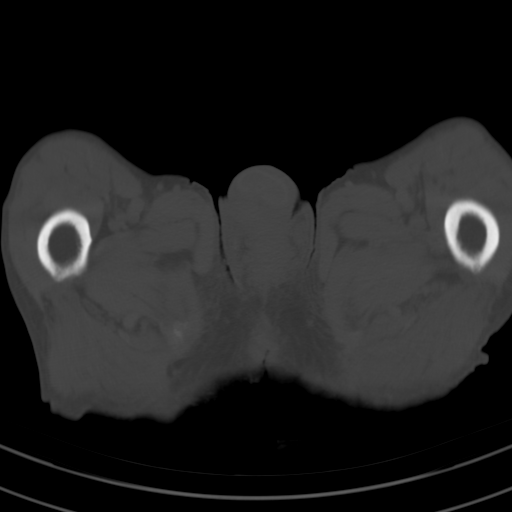
[im 10/76  soft-tissue]
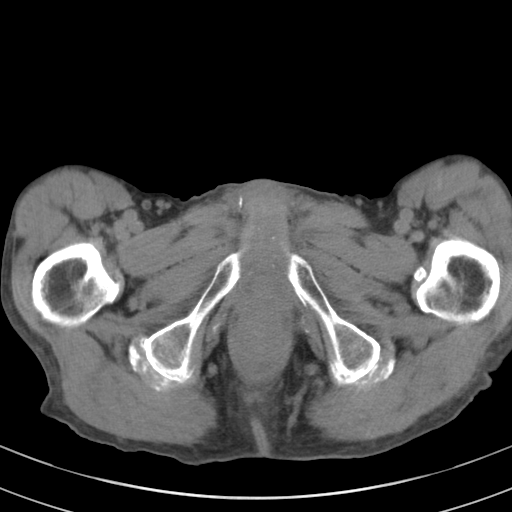
[im 16/76  soft-tissue]
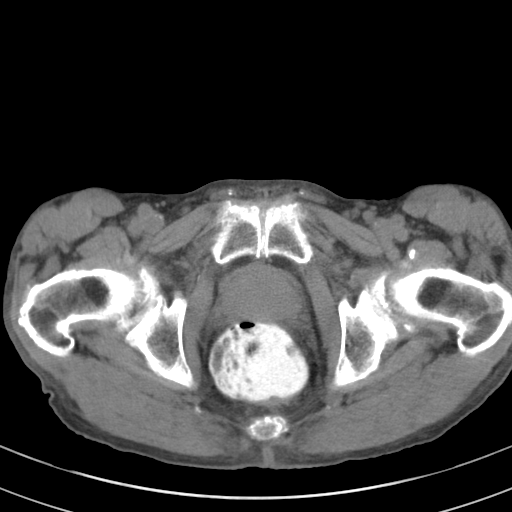
[im 22/76  soft-tissue]
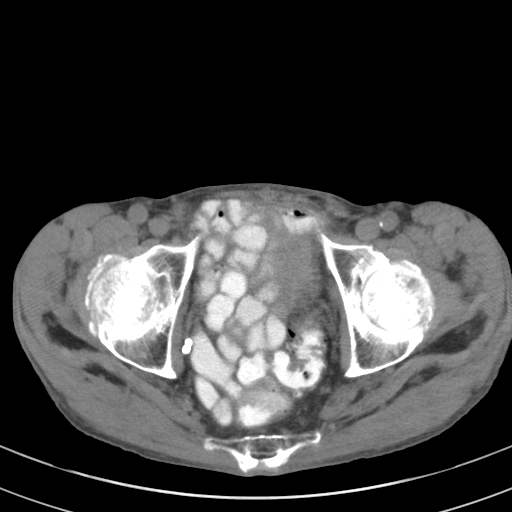
[im 28/76  soft-tissue]
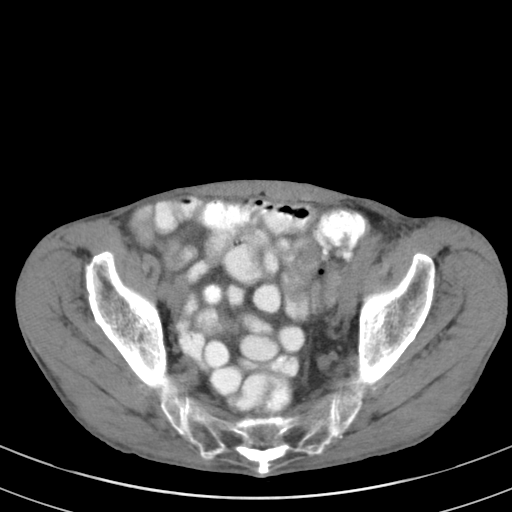
[im 34/76  soft-tissue]
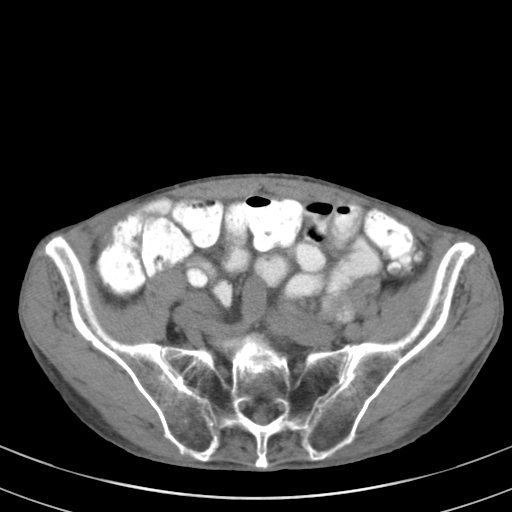
[im 40/76  soft-tissue]
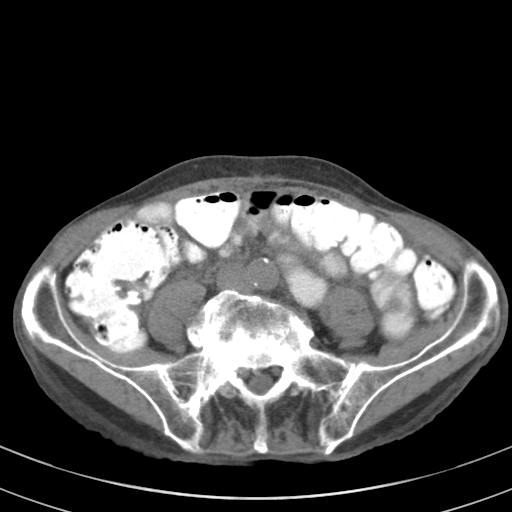
[im 43/76  soft-tissue]
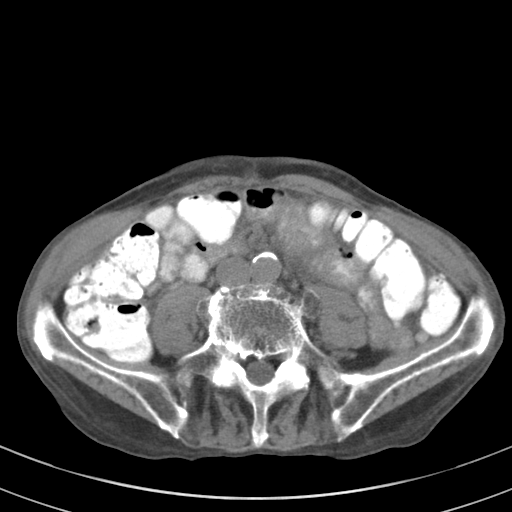
[im 49/76  soft-tissue]
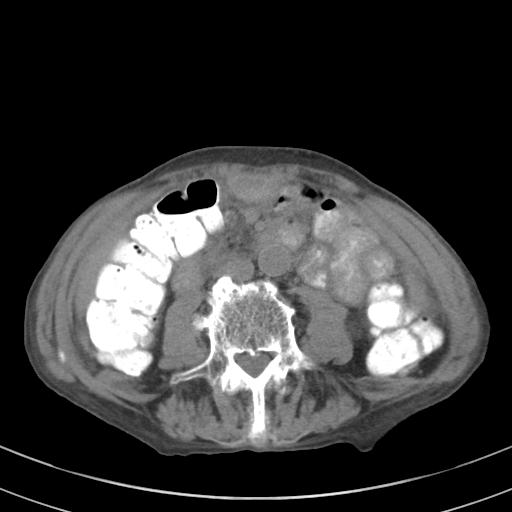
[im 49/76  bone]
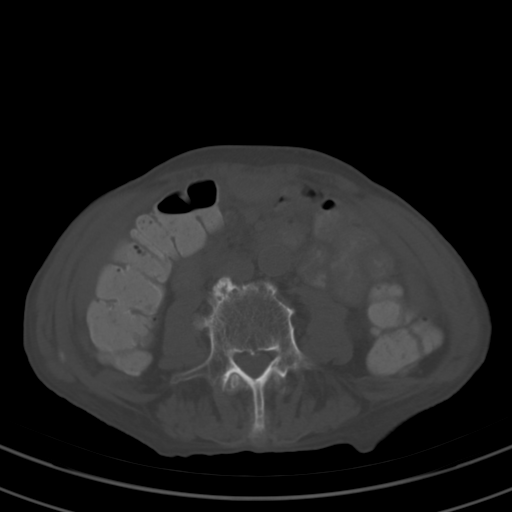
[im 55/76  soft-tissue]
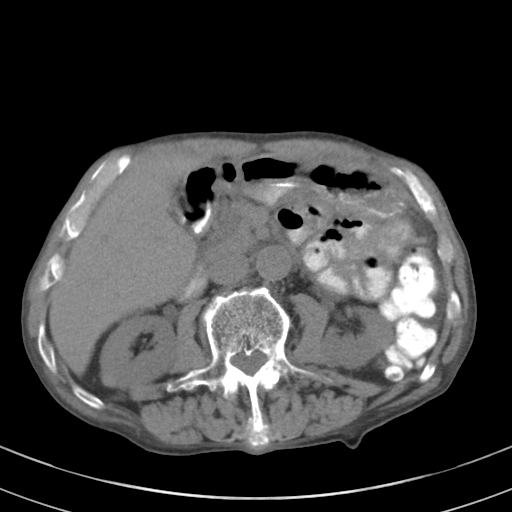
[im 61/76  soft-tissue]
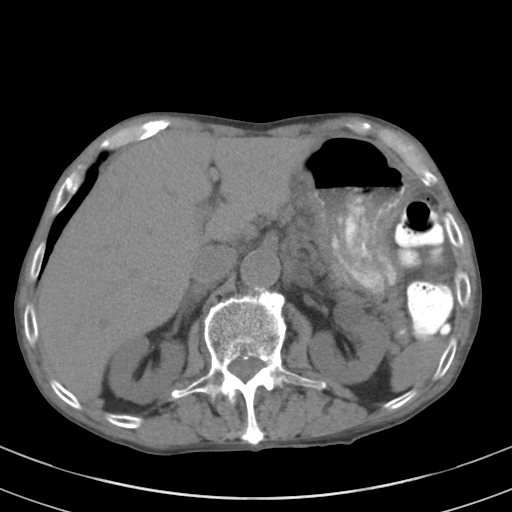
[im 67/76  soft-tissue]
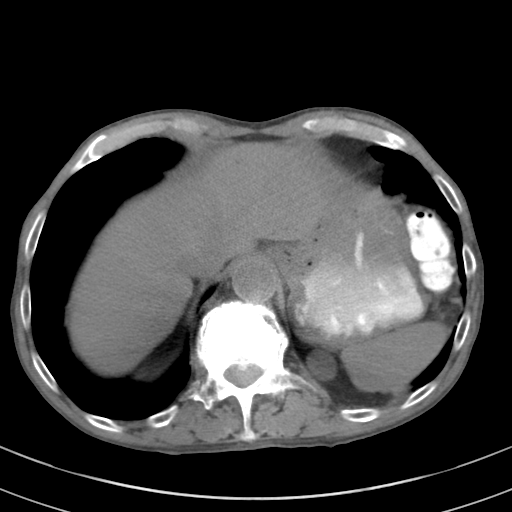
[im 73/76  soft-tissue]
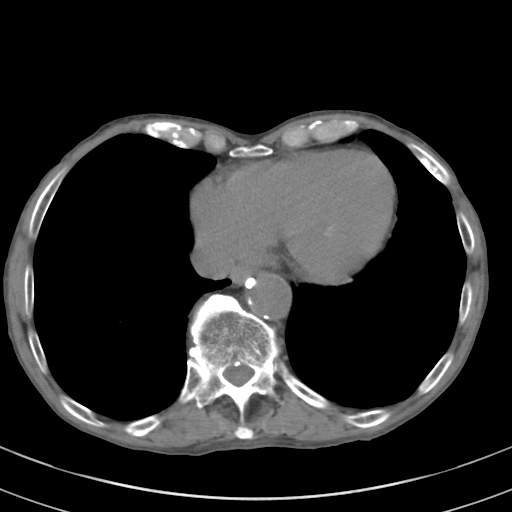

[Series 3: cor · coronal · 0.59mm/px · 3 of 71 slices shown]
[im 24/71  soft-tissue]
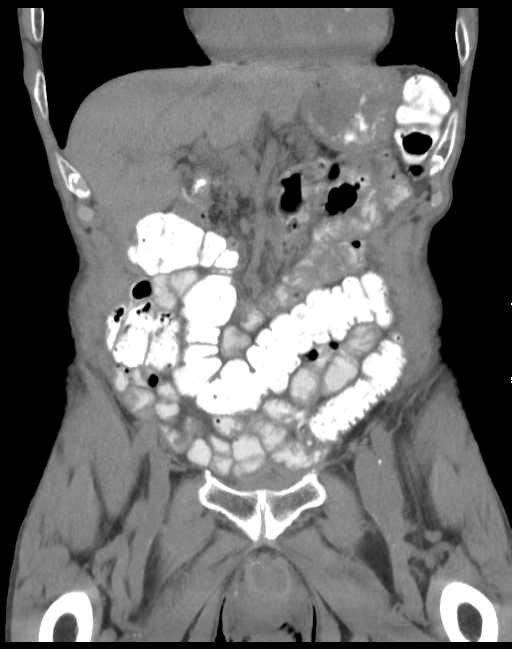
[im 32/71  soft-tissue]
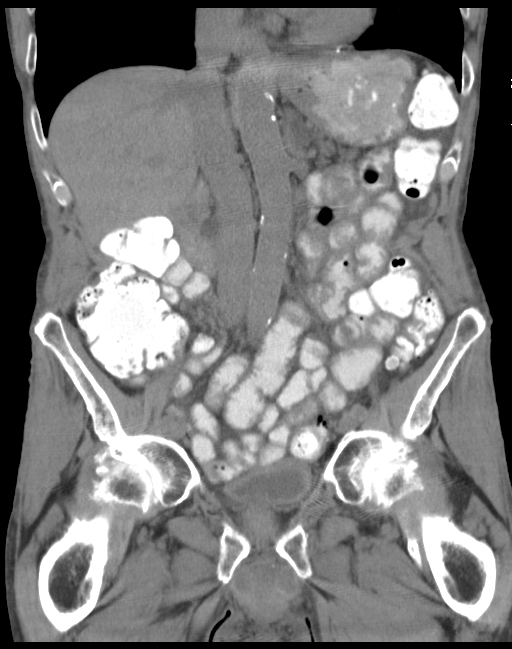
[im 39/71  soft-tissue]
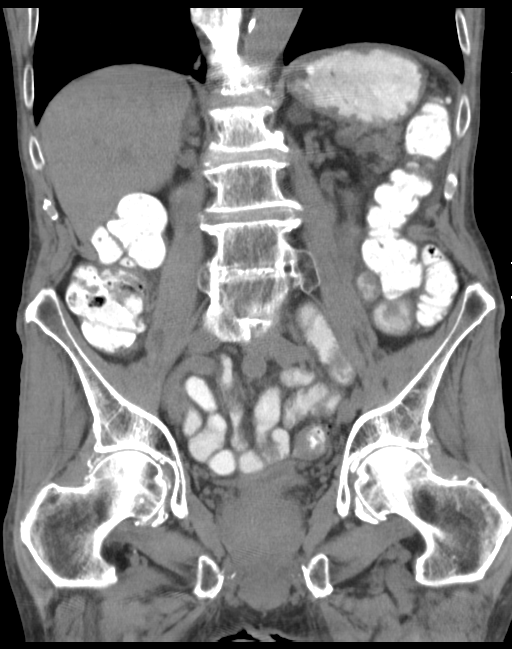

[16 of 46 positions shown; findings below may reference images not displayed]

FINDINGS: Lower chest: Minimal atelectasis at the right lung base. Heart size
is normal. No imaged pericardial effusion or significant coronary
artery calcifications.

Hepatobiliary: The noncontrast appearance of liver is homogeneous.
The gallbladder is decompressed. No pericholecystic fluid is
identified by CT best seen on the previous study.

Pancreas: Normal in appearance.

Spleen: Normal in appearance.

Renal/Adrenal: Stable left adrenal myelolipoma measuring 1.8 x
cm. Right adrenal gland is normal in appearance. Bilateral renal
cysts are present. No hydronephrosis.

Gastrointestinal tract: Stomach and small bowel loops are normal in
appearance. There are numerous colonic diverticula. No acute
diverticulitis identified. Within the proximal sigmoid colon there
is a focal area of wall thickening, closely associated numerous
colonic diverticula. Findings best identified on image 53 of series
2. Considerations include diverticulosis or malignancy. Recommend
followup. Appendix is not well seen and may be obscured or absent.

Reproductive/Pelvis: Urinary bladder and prostate gland are normal
in appearance.

Vascular/Lymphatic: There is atherosclerotic calcification of the
abdominal aorta. No aneurysm. No retroperitoneal or mesenteric
adenopathy.

Musculoskeletal/Abdominal wall: Unremarkable.

Other: Significant degenerative changes in the lumbar spine. Prior
kyphoplasty at L1. No suspicious lytic or blastic lesions are
identified.
IMPRESSION: 1. Colonic diverticulosis.
2. Focal wall thickening in the proximal sigmoid colon raising the
question of diverticulosis or malignancy. Recommend followup CT or
direct colonoscopy.
3. Stable benign left adrenal nodule.
4. Interval resolution of perihepatic fluid.

## 2016-12-11 ENCOUNTER — Other Ambulatory Visit: Payer: Self-pay | Admitting: Cardiology

## 2016-12-11 NOTE — Telephone Encounter (Signed)
REFILL 

## 2016-12-15 ENCOUNTER — Inpatient Hospital Stay (HOSPITAL_COMMUNITY): Payer: Medicare Other

## 2016-12-15 ENCOUNTER — Encounter (HOSPITAL_COMMUNITY)
Admission: EM | Disposition: A | Discharge status: Skilled Nursing Facility | Payer: Self-pay | Source: Home / Self Care | Attending: Internal Medicine

## 2016-12-15 ENCOUNTER — Emergency Department (HOSPITAL_COMMUNITY): Payer: Medicare Other

## 2016-12-15 ENCOUNTER — Inpatient Hospital Stay (HOSPITAL_COMMUNITY)
Admission: EM | Admit: 2016-12-15 | Discharge: 2016-12-18 | DRG: 480 | Disposition: A | Discharge status: Skilled Nursing Facility | Payer: Medicare Other | Attending: Internal Medicine | Admitting: Internal Medicine

## 2016-12-15 ENCOUNTER — Encounter (HOSPITAL_COMMUNITY): Payer: Self-pay | Admitting: Emergency Medicine

## 2016-12-15 ENCOUNTER — Inpatient Hospital Stay (HOSPITAL_COMMUNITY): Payer: Medicare Other | Admitting: Anesthesiology

## 2016-12-15 DIAGNOSIS — Y92008 Other place in unspecified non-institutional (private) residence as the place of occurrence of the external cause: Secondary | ICD-10-CM

## 2016-12-15 DIAGNOSIS — R918 Other nonspecific abnormal finding of lung field: Secondary | ICD-10-CM

## 2016-12-15 DIAGNOSIS — N179 Acute kidney failure, unspecified: Secondary | ICD-10-CM | POA: Diagnosis not present

## 2016-12-15 DIAGNOSIS — H409 Unspecified glaucoma: Secondary | ICD-10-CM | POA: Diagnosis present

## 2016-12-15 DIAGNOSIS — J90 Pleural effusion, not elsewhere classified: Secondary | ICD-10-CM

## 2016-12-15 DIAGNOSIS — K5792 Diverticulitis of intestine, part unspecified, without perforation or abscess without bleeding: Secondary | ICD-10-CM | POA: Diagnosis present

## 2016-12-15 DIAGNOSIS — M1990 Unspecified osteoarthritis, unspecified site: Secondary | ICD-10-CM | POA: Diagnosis present

## 2016-12-15 DIAGNOSIS — I4892 Unspecified atrial flutter: Secondary | ICD-10-CM | POA: Diagnosis present

## 2016-12-15 DIAGNOSIS — I97791 Other intraoperative cardiac functional disturbances during other surgery: Secondary | ICD-10-CM | POA: Diagnosis not present

## 2016-12-15 DIAGNOSIS — W01190D Fall on same level from slipping, tripping and stumbling with subsequent striking against furniture, subsequent encounter: Secondary | ICD-10-CM | POA: Diagnosis not present

## 2016-12-15 DIAGNOSIS — H9190 Unspecified hearing loss, unspecified ear: Secondary | ICD-10-CM | POA: Diagnosis present

## 2016-12-15 DIAGNOSIS — Z79899 Other long term (current) drug therapy: Secondary | ICD-10-CM | POA: Diagnosis not present

## 2016-12-15 DIAGNOSIS — I350 Nonrheumatic aortic (valve) stenosis: Secondary | ICD-10-CM | POA: Diagnosis not present

## 2016-12-15 DIAGNOSIS — W01190A Fall on same level from slipping, tripping and stumbling with subsequent striking against furniture, initial encounter: Secondary | ICD-10-CM

## 2016-12-15 DIAGNOSIS — R410 Disorientation, unspecified: Secondary | ICD-10-CM | POA: Diagnosis not present

## 2016-12-15 DIAGNOSIS — N170 Acute kidney failure with tubular necrosis: Secondary | ICD-10-CM | POA: Diagnosis present

## 2016-12-15 DIAGNOSIS — X58XXXD Exposure to other specified factors, subsequent encounter: Secondary | ICD-10-CM | POA: Diagnosis not present

## 2016-12-15 DIAGNOSIS — R52 Pain, unspecified: Secondary | ICD-10-CM | POA: Diagnosis present

## 2016-12-15 DIAGNOSIS — I352 Nonrheumatic aortic (valve) stenosis with insufficiency: Secondary | ICD-10-CM | POA: Diagnosis present

## 2016-12-15 DIAGNOSIS — S72001A Fracture of unspecified part of neck of right femur, initial encounter for closed fracture: Secondary | ICD-10-CM

## 2016-12-15 DIAGNOSIS — E869 Volume depletion, unspecified: Secondary | ICD-10-CM | POA: Diagnosis present

## 2016-12-15 DIAGNOSIS — R000 Tachycardia, unspecified: Secondary | ICD-10-CM | POA: Diagnosis not present

## 2016-12-15 DIAGNOSIS — W010XXA Fall on same level from slipping, tripping and stumbling without subsequent striking against object, initial encounter: Secondary | ICD-10-CM | POA: Diagnosis present

## 2016-12-15 DIAGNOSIS — N183 Chronic kidney disease, stage 3 (moderate): Secondary | ICD-10-CM

## 2016-12-15 DIAGNOSIS — I351 Nonrheumatic aortic (valve) insufficiency: Secondary | ICD-10-CM | POA: Diagnosis present

## 2016-12-15 DIAGNOSIS — I483 Typical atrial flutter: Secondary | ICD-10-CM

## 2016-12-15 DIAGNOSIS — Z419 Encounter for procedure for purposes other than remedying health state, unspecified: Secondary | ICD-10-CM

## 2016-12-15 DIAGNOSIS — S72141A Displaced intertrochanteric fracture of right femur, initial encounter for closed fracture: Secondary | ICD-10-CM | POA: Diagnosis present

## 2016-12-15 DIAGNOSIS — I358 Other nonrheumatic aortic valve disorders: Secondary | ICD-10-CM | POA: Diagnosis present

## 2016-12-15 DIAGNOSIS — N189 Chronic kidney disease, unspecified: Secondary | ICD-10-CM | POA: Diagnosis not present

## 2016-12-15 DIAGNOSIS — S72141D Displaced intertrochanteric fracture of right femur, subsequent encounter for closed fracture with routine healing: Secondary | ICD-10-CM | POA: Diagnosis not present

## 2016-12-15 DIAGNOSIS — Z7901 Long term (current) use of anticoagulants: Secondary | ICD-10-CM

## 2016-12-15 HISTORY — PX: INTRAMEDULLARY (IM) NAIL INTERTROCHANTERIC: SHX5875

## 2016-12-15 LAB — BASIC METABOLIC PANEL
Anion gap: 7 (ref 5–15)
BUN: 25 mg/dL — AB (ref 6–20)
CHLORIDE: 106 mmol/L (ref 101–111)
CO2: 25 mmol/L (ref 22–32)
Calcium: 8.8 mg/dL — ABNORMAL LOW (ref 8.9–10.3)
Creatinine, Ser: 1.33 mg/dL — ABNORMAL HIGH (ref 0.61–1.24)
GFR calc Af Amer: 53 mL/min — ABNORMAL LOW (ref >60)
GFR calc non Af Amer: 46 mL/min — ABNORMAL LOW (ref >60)
Glucose, Bld: 119 mg/dL — ABNORMAL HIGH (ref 65–99)
POTASSIUM: 4.6 mmol/L (ref 3.5–5.1)
SODIUM: 138 mmol/L (ref 135–145)

## 2016-12-15 LAB — I-STAT TROPONIN, ED: Troponin i, poc: 0 ng/mL (ref 0.00–0.08)

## 2016-12-15 LAB — ABO/RH: ABO/RH(D): A POS

## 2016-12-15 LAB — CBC WITH DIFFERENTIAL/PLATELET
Basophils Absolute: 0 10*3/uL (ref 0.0–0.1)
Basophils Relative: 0 %
Eosinophils Absolute: 0 10*3/uL (ref 0.0–0.7)
Eosinophils Relative: 0 %
HCT: 38 % — ABNORMAL LOW (ref 39.0–52.0)
HEMOGLOBIN: 12.6 g/dL — AB (ref 13.0–17.0)
LYMPHS ABS: 0.5 10*3/uL — AB (ref 0.7–4.0)
LYMPHS PCT: 8 %
MCH: 32.2 pg (ref 26.0–34.0)
MCHC: 33.2 g/dL (ref 30.0–36.0)
MCV: 97.2 fL (ref 78.0–100.0)
Monocytes Absolute: 0.6 10*3/uL (ref 0.1–1.0)
Monocytes Relative: 9 %
Neutro Abs: 5.5 10*3/uL (ref 1.7–7.7)
Neutrophils Relative %: 83 %
Platelets: 156 10*3/uL (ref 150–400)
RBC: 3.91 MIL/uL — AB (ref 4.22–5.81)
RDW: 13.1 % (ref 11.5–15.5)
WBC: 6.7 10*3/uL (ref 4.0–10.5)

## 2016-12-15 LAB — URINALYSIS, ROUTINE W REFLEX MICROSCOPIC
Bacteria, UA: NONE SEEN
Bilirubin Urine: NEGATIVE
Glucose, UA: 50 mg/dL — AB
Hgb urine dipstick: NEGATIVE
Ketones, ur: NEGATIVE mg/dL
Leukocytes, UA: NEGATIVE
Nitrite: NEGATIVE
Protein, ur: 100 mg/dL — AB
Specific Gravity, Urine: 1.016 (ref 1.005–1.030)
Squamous Epithelial / HPF: NONE SEEN
pH: 5 (ref 5.0–8.0)

## 2016-12-15 LAB — PREPARE RBC (CROSSMATCH)

## 2016-12-15 LAB — PROTIME-INR
INR: 1.34
Prothrombin Time: 16.6 seconds — ABNORMAL HIGH (ref 11.4–15.2)

## 2016-12-15 LAB — GLUCOSE, CAPILLARY: Glucose-Capillary: 153 mg/dL — ABNORMAL HIGH (ref 65–99)

## 2016-12-15 SURGERY — FIXATION, FRACTURE, INTERTROCHANTERIC, WITH INTRAMEDULLARY ROD
Anesthesia: General | Laterality: Right

## 2016-12-15 MED ORDER — FENTANYL CITRATE (PF) 250 MCG/5ML IJ SOLN
INTRAMUSCULAR | Status: AC
  Filled 2016-12-15: qty 5

## 2016-12-15 MED ORDER — SODIUM CHLORIDE 0.9% FLUSH
3.0000 mL | Freq: Two times a day (BID) | INTRAVENOUS | Status: DC
  Administered 2016-12-15 – 2016-12-18 (×5): 3 mL via INTRAVENOUS

## 2016-12-15 MED ORDER — LACTATED RINGERS IV SOLN
INTRAVENOUS | Status: DC | PRN
  Administered 2016-12-15: 18:00:00 via INTRAVENOUS

## 2016-12-15 MED ORDER — HYDROCODONE-ACETAMINOPHEN 5-325 MG PO TABS
1.0000 | ORAL_TABLET | Freq: Four times a day (QID) | ORAL | Status: DC | PRN
  Administered 2016-12-16 – 2016-12-18 (×5): 2 via ORAL
  Filled 2016-12-15 (×5): qty 2

## 2016-12-15 MED ORDER — SODIUM CHLORIDE 0.9 % IV SOLN
INTRAVENOUS | Status: DC | PRN
  Administered 2016-12-15: 18:00:00 via INTRAVENOUS

## 2016-12-15 MED ORDER — FENTANYL CITRATE (PF) 100 MCG/2ML IJ SOLN
25.0000 ug | Freq: Once | INTRAMUSCULAR | Status: AC
  Administered 2016-12-15: 25 ug via INTRAVENOUS
  Filled 2016-12-15: qty 2

## 2016-12-15 MED ORDER — SUCCINYLCHOLINE CHLORIDE 200 MG/10ML IV SOSY
PREFILLED_SYRINGE | INTRAVENOUS | Status: DC | PRN
  Administered 2016-12-15: 100 mg via INTRAVENOUS

## 2016-12-15 MED ORDER — LIDOCAINE HCL (CARDIAC) 20 MG/ML IV SOLN
INTRAVENOUS | Status: DC | PRN
Start: 2016-12-15 — End: 2016-12-15
  Administered 2016-12-15: 100 mg via INTRAVENOUS

## 2016-12-15 MED ORDER — CEFAZOLIN SODIUM-DEXTROSE 2-4 GM/100ML-% IV SOLN
INTRAVENOUS | Status: AC
  Filled 2016-12-15: qty 100

## 2016-12-15 MED ORDER — LACTATED RINGERS IV SOLN: INTRAVENOUS | Status: DC

## 2016-12-15 MED ORDER — SENNOSIDES-DOCUSATE SODIUM 8.6-50 MG PO TABS
1.0000 | ORAL_TABLET | Freq: Every evening | ORAL | Status: DC | PRN
  Filled 2016-12-15: qty 1

## 2016-12-15 MED ORDER — CEFAZOLIN SODIUM-DEXTROSE 2-4 GM/100ML-% IV SOLN
2.0000 g | INTRAVENOUS | Status: AC
  Administered 2016-12-15: 2 g via INTRAVENOUS

## 2016-12-15 MED ORDER — ONDANSETRON HCL 4 MG/2ML IJ SOLN
4.0000 mg | Freq: Four times a day (QID) | INTRAMUSCULAR | Status: DC | PRN
  Administered 2016-12-15: 4 mg via INTRAVENOUS

## 2016-12-15 MED ORDER — HYDROMORPHONE HCL 1 MG/ML IJ SOLN
0.5000 mg | INTRAMUSCULAR | Status: DC | PRN
  Administered 2016-12-15: 1 mg via INTRAVENOUS
  Filled 2016-12-15: qty 1

## 2016-12-15 MED ORDER — CEFAZOLIN SODIUM-DEXTROSE 2-4 GM/100ML-% IV SOLN
2.0000 g | Freq: Four times a day (QID) | INTRAVENOUS | Status: AC
  Administered 2016-12-16: 2 g via INTRAVENOUS
  Filled 2016-12-15 (×2): qty 100

## 2016-12-15 MED ORDER — PHENYLEPHRINE HCL 10 MG/ML IJ SOLN
INTRAVENOUS | Status: DC | PRN
  Administered 2016-12-15: 30 ug/min via INTRAVENOUS

## 2016-12-15 MED ORDER — PROPOFOL 10 MG/ML IV BOLUS
INTRAVENOUS | Status: DC | PRN
  Administered 2016-12-15: 50 mg via INTRAVENOUS

## 2016-12-15 MED ORDER — DILTIAZEM HCL ER COATED BEADS 240 MG PO CP24
240.0000 mg | ORAL_CAPSULE | Freq: Every day | ORAL | Status: DC
  Administered 2016-12-16 – 2016-12-18 (×3): 240 mg via ORAL
  Filled 2016-12-15 (×3): qty 1

## 2016-12-15 MED ORDER — VITAMIN B-12 100 MCG PO TABS
100.0000 ug | ORAL_TABLET | Freq: Every day | ORAL | Status: DC
  Administered 2016-12-16 – 2016-12-18 (×3): 100 ug via ORAL
  Filled 2016-12-15 (×4): qty 1

## 2016-12-15 MED ORDER — POVIDONE-IODINE 10 % EX SWAB: 2.0000 "application " | Freq: Once | CUTANEOUS | Status: DC

## 2016-12-15 MED ORDER — MENTHOL 3 MG MT LOZG
1.0000 | LOZENGE | OROMUCOSAL | Status: DC | PRN
  Filled 2016-12-15: qty 9

## 2016-12-15 MED ORDER — SUGAMMADEX SODIUM 200 MG/2ML IV SOLN
INTRAVENOUS | Status: DC | PRN
  Administered 2016-12-15: 172 mg via INTRAVENOUS

## 2016-12-15 MED ORDER — PHENYLEPHRINE HCL 10 MG/ML IJ SOLN
INTRAMUSCULAR | Status: DC | PRN
  Administered 2016-12-15: 80 ug via INTRAVENOUS

## 2016-12-15 MED ORDER — CHLORHEXIDINE GLUCONATE 4 % EX LIQD: 60.0000 mL | Freq: Once | CUTANEOUS | Status: DC

## 2016-12-15 MED ORDER — PHENOL 1.4 % MT LIQD
1.0000 | OROMUCOSAL | Status: DC | PRN
Start: 2016-12-15 — End: 2016-12-18
  Filled 2016-12-15: qty 177

## 2016-12-15 MED ORDER — PROPOFOL 10 MG/ML IV BOLUS
INTRAVENOUS | Status: AC
  Filled 2016-12-15: qty 20

## 2016-12-15 MED ORDER — ROCURONIUM BROMIDE 100 MG/10ML IV SOLN
INTRAVENOUS | Status: DC | PRN
  Administered 2016-12-15: 30 mg via INTRAVENOUS

## 2016-12-15 MED ORDER — FENTANYL CITRATE (PF) 100 MCG/2ML IJ SOLN: 25.0000 ug | INTRAMUSCULAR | Status: DC | PRN

## 2016-12-15 MED ORDER — APIXABAN 2.5 MG PO TABS
2.5000 mg | ORAL_TABLET | Freq: Two times a day (BID) | ORAL | Status: DC
  Filled 2016-12-15: qty 1

## 2016-12-15 MED ORDER — DILTIAZEM HCL 100 MG IV SOLR
5.0000 mg/h | INTRAVENOUS | Status: DC
  Administered 2016-12-15: 5 mg/h via INTRAVENOUS
  Administered 2016-12-16: 10 mg/h via INTRAVENOUS
  Filled 2016-12-15 (×2): qty 100

## 2016-12-15 MED ORDER — BACLOFEN 10 MG PO TABS
5.0000 mg | ORAL_TABLET | Freq: Three times a day (TID) | ORAL | Status: DC | PRN
  Filled 2016-12-15: qty 1

## 2016-12-15 MED ORDER — ESMOLOL HCL 100 MG/10ML IV SOLN
INTRAVENOUS | Status: DC | PRN
  Administered 2016-12-15: 30 mg via INTRAVENOUS

## 2016-12-15 MED ORDER — POLYETHYLENE GLYCOL 3350 17 G PO PACK
17.0000 g | PACK | Freq: Every day | ORAL | Status: DC | PRN
  Filled 2016-12-15: qty 1

## 2016-12-15 MED ORDER — FENTANYL CITRATE (PF) 100 MCG/2ML IJ SOLN
INTRAMUSCULAR | Status: DC | PRN
  Administered 2016-12-15 (×2): 50 ug via INTRAVENOUS

## 2016-12-15 MED ORDER — ONDANSETRON HCL 4 MG PO TABS: 4.0000 mg | ORAL_TABLET | Freq: Four times a day (QID) | ORAL | Status: DC | PRN

## 2016-12-15 MED ORDER — SODIUM CHLORIDE 0.9 % IV SOLN
Freq: Once | INTRAVENOUS | Status: AC
  Administered 2016-12-15: 14:00:00 via INTRAVENOUS

## 2016-12-15 MED ORDER — FENTANYL CITRATE (PF) 100 MCG/2ML IJ SOLN
50.0000 ug | Freq: Once | INTRAMUSCULAR | Status: AC
  Administered 2016-12-15: 50 ug via INTRAVENOUS
  Filled 2016-12-15: qty 2

## 2016-12-15 SURGICAL SUPPLY — 33 items
BNDG COHESIVE 4X5 TAN STRL (GAUZE/BANDAGES/DRESSINGS) ×3 IMPLANT
BNDG GAUZE ELAST 4 BULKY (GAUZE/BANDAGES/DRESSINGS) ×3 IMPLANT
COVER PERINEAL POST (MISCELLANEOUS) ×3 IMPLANT
COVER SURGICAL LIGHT HANDLE (MISCELLANEOUS) ×3 IMPLANT
DRAPE STERI IOBAN 125X83 (DRAPES) ×3 IMPLANT
DRSG MEPILEX BORDER 4X4 (GAUZE/BANDAGES/DRESSINGS) ×4 IMPLANT
DURAPREP 26ML APPLICATOR (WOUND CARE) ×3 IMPLANT
ELECT REM PT RETURN 9FT ADLT (ELECTROSURGICAL) ×3
ELECTRODE REM PT RTRN 9FT ADLT (ELECTROSURGICAL) ×1 IMPLANT
GLOVE BIO SURGEON STRL SZ7.5 (GLOVE) ×6 IMPLANT
GLOVE BIOGEL PI IND STRL 8 (GLOVE) ×2 IMPLANT
GLOVE BIOGEL PI INDICATOR 8 (GLOVE) ×4
GOWN STRL REUS W/ TWL LRG LVL3 (GOWN DISPOSABLE) ×3 IMPLANT
GOWN STRL REUS W/TWL LRG LVL3 (GOWN DISPOSABLE) ×9
GUIDEROD T2 3X1000 (ROD) ×2 IMPLANT
K-WIRE  3.2X450M STR (WIRE) ×2
K-WIRE 3.2X450M STR (WIRE) ×1
KIT ROOM TURNOVER OR (KITS) ×3 IMPLANT
KWIRE 3.2X450M STR (WIRE) IMPLANT
MANIFOLD NEPTUNE II (INSTRUMENTS) ×3 IMPLANT
NAIL GAMMA LG R 5TI 10X360X125 (Nail) ×2 IMPLANT
NS IRRIG 1000ML POUR BTL (IV SOLUTION) ×3 IMPLANT
PACK GENERAL/GYN (CUSTOM PROCEDURE TRAY) ×3 IMPLANT
PAD ARMBOARD 7.5X6 YLW CONV (MISCELLANEOUS) ×3 IMPLANT
PAD CAST 4YDX4 CTTN HI CHSV (CAST SUPPLIES) ×1 IMPLANT
PADDING CAST COTTON 4X4 STRL (CAST SUPPLIES)
SCREW LAG GAMMA 3 TI 10.5X90MM (Screw) ×2 IMPLANT
SUT MNCRL AB 4-0 PS2 18 (SUTURE) ×2 IMPLANT
SUT MON AB 2-0 CT1 27 (SUTURE) ×1 IMPLANT
SUT VIC AB 0 CT1 27 (SUTURE)
SUT VIC AB 0 CT1 27XBRD ANBCTR (SUTURE) ×1 IMPLANT
TOWEL OR 17X24 6PK STRL BLUE (TOWEL DISPOSABLE) ×1 IMPLANT
TOWEL OR 17X26 10 PK STRL BLUE (TOWEL DISPOSABLE) ×1 IMPLANT

## 2016-12-15 NOTE — Transfer of Care (Signed)
Immediate Anesthesia Transfer of Care Note  Patient: Patrick Walker  Procedure(s) Performed: Procedure(s): INTRAMEDULLARY (IM) NAIL INTERTROCHANTRIC (Right)  Patient Location: PACU  Anesthesia Type:General  Level of Consciousness: awake, alert  and oriented  Airway & Oxygen Therapy: Patient Spontanous Breathing and Patient connected to nasal cannula oxygen  Post-op Assessment: Report given to RN, Post -op Vital signs reviewed and stable and Patient moving all extremities  Post vital signs: Reviewed and stable  Last Vitals:  Vitals:   12/15/16 1700 12/15/16 1943  BP: (!) 169/70 118/87  Pulse: (!) 126 (!) 114  Resp:  17  Temp:  36.3 C    Last Pain:  Vitals:   12/15/16 1943  TempSrc:   PainSc: 0-No pain         Complications: No apparent anesthesia complications

## 2016-12-15 NOTE — Anesthesia Preprocedure Evaluation (Addendum)
Anesthesia Evaluation  Patient identified by MRN, date of birth, ID band Patient awake    Reviewed: Allergy & Precautions, NPO status , Patient's Chart, lab work & pertinent test results  Airway Mallampati: I  TM Distance: >3 FB Neck ROM: Full    Dental   Pulmonary shortness of breath,    Pulmonary exam normal        Cardiovascular Normal cardiovascular exam+ dysrhythmias Atrial Fibrillation + Valvular Problems/Murmurs AS   History noted. CG   Neuro/Psych    GI/Hepatic negative GI ROS, Neg liver ROS,   Endo/Other  Hypothyroidism   Renal/GU Renal disease     Musculoskeletal  (+) Arthritis ,   Abdominal   Peds  Hematology   Anesthesia Other Findings   Reproductive/Obstetrics                            Anesthesia Physical Anesthesia Plan  ASA: IV  Anesthesia Plan: General   Post-op Pain Management:    Induction: Intravenous  Airway Management Planned: Oral ETT  Additional Equipment: Arterial line  Intra-op Plan:   Post-operative Plan: Possible Post-op intubation/ventilation  Informed Consent: I have reviewed the patients History and Physical, chart, labs and discussed the procedure including the risks, benefits and alternatives for the proposed anesthesia with the patient or authorized representative who has indicated his/her understanding and acceptance.   Dental advisory given  Plan Discussed with: CRNA and Surgeon  Anesthesia Plan Comments:        Anesthesia Quick Evaluation

## 2016-12-15 NOTE — Op Note (Signed)
DATE OF SURGERY:  12/15/2016  TIME: 7:19 PM  PATIENT NAME:  Patrick Walker  AGE: 83 y.o.  PRE-OPERATIVE DIAGNOSIS:  right fractured hip  POST-OPERATIVE DIAGNOSIS:  SAME  PROCEDURE:  INTRAMEDULLARY (IM) NAIL INTERTROCHANTRIC  SURGEON:  Robbie Nangle D  ASSISTANT:  Roxan Hockey, PA-C, he was present and scrubbed throughout the case, critical for completion in a timely fashion, and for retraction, instrumentation, and closure.   OPERATIVE IMPLANTS: [employer] Gamma Nail  PREOPERATIVE INDICATIONS:  Patrick Walker is a 83 y.o. year old who fell and suffered a hip fracture. He was brought into the ER and then admitted and optimized and then elected for surgical intervention.    The risks benefits and alternatives were discussed with the patient including but not limited to the risks of nonoperative treatment, versus surgical intervention including infection, bleeding, nerve injury, malunion, nonunion, hardware prominence, hardware failure, need for hardware removal, blood clots, cardiopulmonary complications, morbidity, mortality, among others, and they were willing to proceed.    OPERATIVE PROCEDURE:  The patient was brought to the operating room and placed in the supine position. General anesthesia was administered. He was placed on the fracture table.  Closed reduction was performed under C-arm guidance. Time out was then performed after sterile prep and drape. He received preoperative antibiotics.  Incision was made proximal to the greater trochanter. A guidewire was placed in the appropriate position. Confirmation was made on AP and lateral views. The above-named nail was opened. I opened the proximal femur with a reamer. I then placed the nail by hand easily down. I did not need to ream the femur.  Once the nail was completely seated, I placed a guidepin into the femoral head into the center center position. I measured the length, and then reamed the lateral cortex and up into the head. I  then placed the lag screw. Slight compression was applied. Anatomic fixation achieved. Bone quality was mediocre.  I then secured the proximal interlocking bolt, and took off a half a turn, and then removed the instruments, and took final C-arm pictures AP and lateral the entire length of the leg.   Anatomic reconstruction was achieved, and the wounds were irrigated copiously and closed with Vicryl followed by staples and sterile gauze for the skin. The patient was awakened and returned to PACU in stable and satisfactory condition. There no complications and the patient tolerated the procedure well.  He will be weightbearing as tolerated, and will be on chemical px  for a period of four weeks after discharge.   Edmonia Lynch, M.D.

## 2016-12-15 NOTE — Progress Notes (Signed)
Dr. Nyoka Cowden notified of heart rate.

## 2016-12-15 NOTE — Anesthesia Postprocedure Evaluation (Signed)
Anesthesia Post Note  Patient: Patrick Walker  Procedure(s) Performed: Procedure(s) (LRB): INTRAMEDULLARY (IM) NAIL INTERTROCHANTRIC (Right)  Patient location during evaluation: PACU Anesthesia Type: General Level of consciousness: awake and alert Pain management: pain level controlled Vital Signs Assessment: post-procedure vital signs reviewed and stable Respiratory status: spontaneous breathing, nonlabored ventilation, respiratory function stable and patient connected to nasal cannula oxygen Cardiovascular status: blood pressure returned to baseline and stable Postop Assessment: no signs of nausea or vomiting Anesthetic complications: no       Last Vitals:  Vitals:   12/15/16 1957 12/15/16 2020  BP:    Pulse:    Resp:    Temp: 36.3 C 36.5 C    Last Pain:  Vitals:   12/15/16 2020  TempSrc: Oral  PainSc:                  [name] DAVID

## 2016-12-15 NOTE — Progress Notes (Signed)
We examined and assessed the patient. He was extubated successfuly post-op. Spoke with Dr. Lamonte Sakai who is agreement. There are no critical needs at this time. We will be available as needed.

## 2016-12-15 NOTE — Consult Note (Signed)
ORTHOPAEDIC CONSULTATION  REQUESTING PHYSICIAN: Aldine Contes, MD  Chief Complaint: Right hip pain   Assessment / Plan: Active Problems:   Intertrochanteric fracture of right femur ([place])  Plan for Operative fixation as soon as today.  Medical clearance pending. -NPO -Medicine team to admit and perform pre-op clearance -PT/OT post op -Will ammend WB status postop, bedrest for now -Likely to require Rehab or SNF placement upon discharge.  -VTE prophylaxis: SCDs.  Will plan to resume eliquis post op.  The risks benefits and alternatives of the procedure were discussed with the patient and his family. The patient verbalizes understanding and wishes to proceed.    HPI: Patrick Walker is a 83 y.o. male who complains of right hip pain after fall onto his right hip. He stood up in his office, felt lightheaded and fell onto his right side. He did not hit his head. No loss of consciousness. He was unable to ambulate. Presented to the ED where x-rays showed Acute fracture across the intertrochanteric right femur.  Orthopedics was consulted for evaluation.   He takes Eliquis chronically-last dose in the a.m. Last meal at approximately 7 a.m.  He denies allergy to food or medicine. No history of MI, CVA, or DVT. He does not smoke.  He denies pain in any location but his right hip.  Prior to his injury, he has been very active doing various yard work and using heavy equipment. He ambulates without assistive devices.  Past Medical History:  Diagnosis Date  . Aortic insufficiency    Mild, echo, November, 2011  . Aortic valve sclerosis    Echo, November, 2011  . Arthritis   . Atrial flutter [facility])    New diagnosis November, 2012  . Difficulty in urination   . Ejection fraction    EF 55-60%, echo, November, 2012 ( rapid atrial flutter at that time)  . Glaucoma   . Hearing loss   . Irritable bowel disease   . Shortness of breath    November, 2012  . Sinus bradycardia   . TSH  (thyroid-stimulating hormone deficiency)    TSH 6.4,  November, 2012   Past Surgical History:  Procedure Laterality Date  . BACK SURGERY  2008-2009  . HAND SURGERY  1984-85  . HERNIA REPAIR  03/02/2011  . LEG SURGERY  1972   Social History   Social History  . Marital status: Married    Spouse name: N/A  . Number of children: 3  . Years of education: N/A   Occupational History  . retired Psychologist, sport and exercise    Social History Main Topics  . Smoking status: Never Smoker  . Smokeless tobacco: None  . Alcohol use No  . Drug use: No  . Sexual activity: Not Asked   Other Topics Concern  . None   Social History Narrative  . None   No family history on file.   No Known Allergies   Prior to Admission medications   Medication Sig Start Date End Date Taking? Authorizing Provider  apixaban (ELIQUIS) 2.5 MG TABS tablet Take 2.5 mg by mouth 2 (two) times daily.   Yes Historical Provider, MD  diltiazem (CARTIA XT) 240 MG 24 hr capsule Take 1 capsule (240 mg total) by mouth daily. NEED OV. 12/11/16  Yes Peter M Martinique, MD  erythromycin ophthalmic ointment Place 1 application into the left eye at bedtime.   Yes Historical Provider, MD  latanoprost (XALATAN) 0.005 % ophthalmic solution Place 1 drop into both eyes  at bedtime.   Yes Historical Provider, MD  loperamide (IMODIUM A-D) 2 MG tablet Take 2 mg by mouth 4 (four) times daily as needed for diarrhea or loose stools.   Yes Historical Provider, MD  vitamin B-12 (CYANOCOBALAMIN) 100 MCG tablet Take 100 mcg by mouth daily.   Yes Historical Provider, MD  feeding supplement, ENSURE ENLIVE, (ENSURE ENLIVE) LIQD Take 237 mLs by mouth 3 (three) times daily between meals. Patient not taking: Reported on 03/27/2016 02/25/16   Lavina Hamman, MD  polyethylene glycol [facility]) packet Take 17 g by mouth daily. Patient not taking: Reported on 03/27/2016 02/25/16   Lavina Hamman, MD   Dg Chest 1 View  Result Date: 12/15/2016 CLINICAL DATA:  [place] this morning and  fractured right hip. EXAM: CHEST 1 VIEW COMPARISON:  Chest x-ray a 02/15/2014 FINDINGS: The deep cardiac silhouette, mediastinal and hilar contours are within normal limits. There is tortuosity and calcification of the thoracic aorta. Stable calcified pleural densities are noted bilaterally. There are stable emphysematous changes. Prominent skin fold noted over the left chest but no definite pneumothorax. Left basilar process appears to be a combination of small effusion and left lower lobe atelectasis or infiltrate. The right lung is clear. The bony thorax is intact. IMPRESSION: Chronic emphysematous changes, pulmonary scarring and pleural calcifications. Left lower lobe process likely a combination of effusion and atelectasis or infiltrate. Electronically Signed   By: Marijo Sanes M.D.   On: 12/15/2016 13:30   Dg Hip Unilat  With Pelvis 2-3 Views Right  Result Date: 12/15/2016 CLINICAL DATA:  Fall with right hip pain.  Initial encounter. EXAM: DG HIP (WITH OR WITHOUT PELVIS) 2-3V RIGHT COMPARISON:  None. FINDINGS: Asymmetric lucency obliquely across the intertrochanteric right femur. No displacement. No pelvic ring fracture noted. Limited over the sacrum due to overlapping bowel gas. Osteopenia. IMPRESSION: Acute fracture across the intertrochanteric right femur. Extension to the cortex is not visualized and CT could confirm/characterize. Electronically Signed   By: Monte Fantasia M.D.   On: 12/15/2016 13:38    Positive ROS: All other systems have been reviewed and were otherwise negative with the exception of those mentioned in the HPI and as above.  Objective: Labs cbc  Recent Labs  12/15/16 1230  WBC 6.7  HGB 12.6*  HCT 38.0*  PLT 156    Labs coag  Recent Labs  12/15/16 1230  INR 1.34     Recent Labs  12/15/16 1230  NA 138  K 4.6  CL 106  CO2 25  GLUCOSE 119*  BUN 25*  CREATININE 1.33*  CALCIUM 8.8*    Physical Exam: Vitals:   12/15/16 1530 12/15/16 1545  BP: (!)  149/89 (!) 142/90  Pulse: 78 (!) 119  Resp: (!) 28 17  Temp:     General: Thin, Alert, no acute distress.  Hard of hearing.  Family at bedside Mental status: Alert and Oriented x3 Neurologic: Speech Clear and organized, no gross focal findings or movement disorder appreciated. Respiratory: No cyanosis, no use of accessory musculature.  Star in place.  O2 sats 85-95 during interview. Cardiovascular: No pedal edema GI: Abdomen is soft and non-tender, non-distended. Skin: Warm and dry.  No lesions in the area of chief complaint  Extremities: Warm and dry Psychiatric: Patient is competent for consent with normal mood and affect  MUSCULOSKELETAL:  Right leg externally rotated.  Pain w/ ROM.  Feet warm.  Dorsiflexion/plantar flexion, EHL/FHL intact. Sensation intact distally. Other extremities are atraumatic with painless  ROM and NVI.   Prudencio Burly III PA-C 12/15/2016 4:13 PM

## 2016-12-15 NOTE — H&P (Signed)
Date: 12/15/2016               Patient Name:  Patrick Walker MRN: [id number]  DOB: [birth year] Age / Sex: 83 y.o., male   PCP: Leonard Downing, MD         Medical Service: Internal Medicine Teaching Service         Attending Physician: Dr. Charlesetta Shanks, MD    First Contact: Dr. Heber [place] Pager: [phone]  Second Contact: Dr. Posey Pronto  Pager: [phone]       After Hours (After 5p/  First Contact Pager: [phone]  weekends / holidays): Second Contact Pager: [phone]   Chief Complaint: Right hip pain  History of Present Illness: Mr. ARUL FARABEE Is an 83 year old male with history of atrial flutter on Cardizem and eliquis, moderate aortic stenosis, and diverticulitis that presents to the ED after a fall at home.  History was provided by patient as well as patient's family due to patient's diminished hearing.  Patient reports he was in his home office when he lost his balance and fell backwards against a dresser than landed on the floor.  He states that the event happened quickly but denies loss of consciousness or head injury. Patient was found on the floor by wife as he was unable to move and transferred by EMS.  He denies nausea/vomiting, shortness of breath, chest pain, or abdominal pain.  Family reports he is constantly active and was at his baseline physical and mental status at the time of the fall.  He denies a history of falls and does not use a walker or cane at home.  He was gardening yesterday and on a bulldozer last week.  Patient follows with Cardiology for his atrial flutter and last took his eliquis this morning and reports compliance with cardizem.   Meds:  Current Meds  Medication Sig  . apixaban (ELIQUIS) 2.5 MG TABS tablet Take 2.5 mg by mouth 2 (two) times daily.  Marland Kitchen diltiazem (CARTIA XT) 240 MG 24 hr capsule Take 1 capsule (240 mg total) by mouth daily. NEED OV.  . erythromycin ophthalmic ointment Place 1 application into the left eye at bedtime.  Marland Kitchen latanoprost (XALATAN)  0.005 % ophthalmic solution Place 1 drop into both eyes at bedtime.  Marland Kitchen loperamide (IMODIUM A-D) 2 MG tablet Take 2 mg by mouth 4 (four) times daily as needed for diarrhea or loose stools.  . vitamin B-12 (CYANOCOBALAMIN) 100 MCG tablet Take 100 mcg by mouth daily.     Allergies: Allergies as of 12/15/2016  . (No Known Allergies)   Past Medical History:  Diagnosis Date  . Aortic insufficiency    Mild, echo, November, 2011  . Aortic valve sclerosis    Echo, November, 2011  . Arthritis   . Atrial flutter [facility])    New diagnosis November, 2012  . Difficulty in urination   . Ejection fraction    EF 55-60%, echo, November, 2012 ( rapid atrial flutter at that time)  . Glaucoma   . Hearing loss   . Irritable bowel disease   . Shortness of breath    November, 2012  . Sinus bradycardia   . TSH (thyroid-stimulating hormone deficiency)    TSH 6.4,  November, 2012    Family History: Sister: Alzheimer's   Social History: Tobacco use: denies, never smoker Alcohol use:  Denies  Review of Systems: A complete ROS was negative except as per HPI.   Physical Exam: Blood pressure (!) 149/92, pulse 79,  temperature 97.5 F (36.4 C), temperature source Oral, resp. rate 17, SpO2 96 %. Constitutional: He is oriented to person, place, and time. He appears well-developed and well-nourished without respiratory distress.  HEENT: NCAT. Oropharynx is clear and moist.  Eyes: EOM are normal. Pupils are equal, round, and reactive to light.  Cardiovascular: Irregular rhythm, non-tachycardic, 3/6 systolic ejection murmur Pulmonary/Chest: Clear to ausculation on anterior and lateral chest wall bilaterally Abdominal: Soft, non-tender in all quadrants, no rebound or guarding, no hepatosplenomegaly, no other palpable masses noted.  Musculoskeletal:  Right hip is externally rotated. Patient reports pain with any range of motion of right lower extremity. No deformity at the knee or the ankle. No significant  peripheral edema. Other extremities have normal intact range of motion. 2+ Pedal pulses intact bilaterally. Neurological: He is alert and oriented to person, place, and time. No cranial nerve deficit. He exhibits normal muscle tone. Coordination normal.  Skin: Skin is warm and dry.  Psychiatric: He has a normal mood and affect.    EKG: Atrial flutter  CXR IMPRESSION:  Chronic emphysematous changes, pulmonary scarring and pleural calcifications.Left lower lobe process likely a combination of effusion and atelectasis or infiltrate.  Hip Unilat With Pelvis 2-3 Views Right  IMPRESSION: Acute fracture across the intertrochanteric right femur. Extension to the cortex is not visualized and CT could confirm/characterize.  CBC    Component Value Date/Time   WBC 6.7 12/15/2016 1230   RBC 3.91 (L) 12/15/2016 1230   HGB 12.6 (L) 12/15/2016 1230   HCT 38.0 (L) 12/15/2016 1230   PLT 156 12/15/2016 1230   MCV 97.2 12/15/2016 1230   MCV 100.3 (A) 02/15/2014 1856   MCH 32.2 12/15/2016 1230   MCHC 33.2 12/15/2016 1230   RDW 13.1 12/15/2016 1230   LYMPHSABS 0.5 (L) 12/15/2016 1230   MONOABS 0.6 12/15/2016 1230   EOSABS 0.0 12/15/2016 1230   BASOSABS 0.0 12/15/2016 1230   BMET    Component Value Date/Time   NA 138 12/15/2016 1230   K 4.6 12/15/2016 1230   CL 106 12/15/2016 1230   CO2 25 12/15/2016 1230   GLUCOSE 119 (H) 12/15/2016 1230   BUN 25 (H) 12/15/2016 1230   CREATININE 1.33 (H) 12/15/2016 1230   CREATININE 1.16 02/15/2014 1856   CALCIUM 8.8 (L) 12/15/2016 1230   GFRNONAA 46 (L) 12/15/2016 1230   GFRAA 53 (L) 12/15/2016 1230    Assessment & Plan by Problem: Active Problems:  Intertrochanteric right femur fracture After falling at home imaging showed right femur fracture.  Orthopedic surgery has been consulted and plan for surgery this afternoon.  Patient leads an active lifestyle prior to his fall and will likely need rehab outpatient following his surgery.  Per history of  patient it is likely a mechanical fall.  Patient is afebrile, no leukocytosis, UA and Chest x-ray with no signs of infection present.  Patient does not have a history of falls or balance issues.  Patient is currently in atrial flutter and denies symptoms of chest pain, palpitations or shortness of breath.  Previous EKGs do not show atrial flutter from 2013-2017.  Concerned that patient may have converted to atrial flutter contributing to his fall.  Will consider consulting Cardiology in the morning.  RCIS is low pre-operative risk for cardiac event.  Would not delay surgery due to atrial flutter as rate is controlled and patient has a history of atrial flutter and asymptomatic at this time.   - Plan for surgery today - NPO - Telemetry -  holding afternoon eliquis  Stage III CKD Creatinine is 1.33 and appears at baseline. - BMET in the morning  History of Atrial Flutter On admission EKG shows atrial flutter.  On exam rate was controlled in the 80s.  Patient had an episode of atrial flutter in 2012 that reverted back to normal sinus rhythm spontaneously. Per chart review EKGs from 2013 - 2017 do not show atrial flutter.  Patient is on eliquis and diltiazem, last taking eliquis this morning. He follows with cardiology last outpatient visit in 07/2015. - EKG in the morning - Consider Cardiology consult in the morning  Aortic Stenosis Last echo on 07/2015 showed LVEF of 60% to 65%. It was noted there was mildly calcified aortic valve with moderate stenosis.  Patient has an active lifestyle.  He denies episodes or syncope, shortness of breath or chest pain.  Per family patient was on a bulldozer a week ago and gardening yesterday.   Dispo: Admit patient to Inpatient with expected length of stay greater than 2 midnights.  Signed: Valinda Party, DO 12/15/2016, 2:59 PM  Pager: [phone]

## 2016-12-15 NOTE — ED Triage Notes (Signed)
Patient from home with [facility] after tripping and falling.  Patient complains of right hip pain.  Received 84mcg fentanyl en route from EMS.  Patient hard of hearing.  Pelvis bound from EMS with sheet, patient states pain improved.  No obvious shortening or rotation of right leg.  Patient alert and oriented and in no apparent distress at this time.

## 2016-12-15 NOTE — Consult Note (Addendum)
Reason for Consult: Atrial flutter with RVR  Requesting Physician: Percell Miller  Cardiologist: Martinique  HPI: This is a 83 y.o. male with a past medical history significant for paroxysmal atrial flutter in the past, on chronic anticoagulation with Eliquis and rate control with diltiazem, moderate aortic stenosis (last echo November 2016, mean gradient 15 mmHg), preserved left ventricular systolic function with mild diastolic dysfunction, presenting with a fall complicated by right hip fracture.  He underwent intra-medullary intratrochanteric nail placement by Dr. Percell Miller this evening. During surgery he had problems with tachycardia. Hypotension was not a big problem.  He is now extubated, has well-controlled pain, appears calm and comfortable and has been admitted to the medical intensive care unit. His heart rate is around 100-110 beats per minutes while receiving intravenous diltiazem at 10 mg/hour.  It's important to note that his flutter waves are high voltage only slightly smaller than the rather small QRS complexes, regardless of which monitoring lead is use. This leads to frequent miscounting of his true heart rate by the monitor.  Prior to surgery he was very active, working in his garden, without physical limitations. He specifically denies syncope/dyspnea/angina on exertion.  PMHx:  Past Medical History:  Diagnosis Date  . Aortic insufficiency    Mild, echo, November, 2011  . Aortic valve sclerosis    Echo, November, 2011  . Arthritis   . Atrial flutter [facility])    New diagnosis November, 2012  . Difficulty in urination   . Ejection fraction    EF 55-60%, echo, November, 2012 ( rapid atrial flutter at that time)  . Glaucoma   . Hearing loss   . Irritable bowel disease   . Shortness of breath    November, 2012  . Sinus bradycardia   . TSH (thyroid-stimulating hormone deficiency)    TSH 6.4,  November, 2012   Past Surgical History:  Procedure Laterality Date  . BACK  SURGERY  2008-2009  . HAND SURGERY  1984-85  . HERNIA REPAIR  03/02/2011  . LEG SURGERY  1972    FAMHx: History reviewed. No pertinent family history.  SOCHx:  reports that he has never smoked. He does not have any smokeless tobacco history on file. He reports that he does not drink alcohol or use drugs.  ALLERGIES: No Known Allergies  ROS: Pertinent items noted in HPI and remainder of comprehensive ROS otherwise negative.  HOME MEDICATIONS: Prescriptions Prior to Admission  Medication Sig Dispense Refill Last Dose  . apixaban (ELIQUIS) 2.5 MG TABS tablet Take 2.5 mg by mouth 2 (two) times daily.   12/15/2016 at 0700  . diltiazem (CARTIA XT) 240 MG 24 hr capsule Take 1 capsule (240 mg total) by mouth daily. NEED OV. 90 capsule 0 12/15/2016 at Unknown time  . erythromycin ophthalmic ointment Place 1 application into the left eye at bedtime.   Past Week at Unknown time  . latanoprost (XALATAN) 0.005 % ophthalmic solution Place 1 drop into both eyes at bedtime.   12/14/2016 at Unknown time  . loperamide (IMODIUM A-D) 2 MG tablet Take 2 mg by mouth 4 (four) times daily as needed for diarrhea or loose stools.   Past Month at Unknown time  . vitamin B-12 (CYANOCOBALAMIN) 100 MCG tablet Take 100 mcg by mouth daily.   Past Month at Unknown time  . feeding supplement, ENSURE ENLIVE, (ENSURE ENLIVE) LIQD Take 237 mLs by mouth 3 (three) times daily between meals. (Patient not taking: Reported on 03/27/2016) 237 mL 12  unk at unk  . polyethylene glycol (MIRALAX) packet Take 17 g by mouth daily. (Patient not taking: Reported on 03/27/2016) 30 each 0 Not Taking at Unknown time    HOSPITAL MEDICATIONS: I have reviewed the patient's current medications.  VITALS: Blood pressure 118/87, pulse (!) 114, temperature 97.3 F (36.3 C), resp. rate 17, SpO2 92 %.  PHYSICAL EXAM:  General: Alert, oriented x3, no distress.He looks a little pale. He is very hard of hearing Head: no evidence of trauma, PERRL,  EOMI, no exophtalmos or lid lag, no myxedema, no xanthelasma; normal ears, nose and oropharynx Neck: Flat jugular venous pulsations and no hepatojugular reflux; brisk carotid pulses without delay and faint bilateral carotid bruits that appear to radiate from the chest Chest: clear to auscultation, no signs of consolidation by percussion or palpation, normal fremitus, symmetrical and full respiratory excursions Cardiovascular: normal position and quality of the apical impulse, irregular rhythm, normal first heart sound and normal second heart sound, no rubs or gallops, grade 3/6 early peaking systolic ejection murmur, no diastolic murmur Abdomen: no tenderness or distention, no masses by palpation, no abnormal pulsatility or arterial bruits, normal bowel sounds, no hepatosplenomegaly Extremities: Surgical dressing right hip, no obvious bleeding no clubbing, cyanosis;  no edema; 2+ radial, ulnar and brachial pulses bilaterally; 2+ right femoral, posterior tibial and dorsalis pedis pulses; 2+ left femoral, posterior tibial and dorsalis pedis pulses; no subclavian or femoral bruits Neurological: grossly nonfocal except for severe hard of hearing   LABS  CBC  Recent Labs  12/15/16 1230  WBC 6.7  NEUTROABS 5.5  HGB 12.6*  HCT 38.0*  MCV 97.2  PLT 628   Basic Metabolic Panel  Recent Labs  12/15/16 1230  NA 138  K 4.6  CL 106  CO2 25  GLUCOSE 119*  BUN 25*  CREATININE 1.33*  CALCIUM 8.8*    Note normal TSH 3.695 in June 2017 Note previous evaluation by hematology for mild chronic normocytic anemia with hemoglobin typically around 11.5-12  IMAGING: Dg Chest 1 View  Result Date: 12/15/2016 CLINICAL DATA:  [place] this morning and fractured right hip. EXAM: CHEST 1 VIEW COMPARISON:  Chest x-ray a 02/15/2014 FINDINGS: The deep cardiac silhouette, mediastinal and hilar contours are within normal limits. There is tortuosity and calcification of the thoracic aorta. Stable calcified pleural  densities are noted bilaterally. There are stable emphysematous changes. Prominent skin fold noted over the left chest but no definite pneumothorax. Left basilar process appears to be a combination of small effusion and left lower lobe atelectasis or infiltrate. The right lung is clear. The bony thorax is intact. IMPRESSION: Chronic emphysematous changes, pulmonary scarring and pleural calcifications. Left lower lobe process likely a combination of effusion and atelectasis or infiltrate. Electronically Signed   By: Marijo Sanes M.D.   On: 12/15/2016 13:30   Dg Hip Unilat  With Pelvis 2-3 Views Right  Result Date: 12/15/2016 CLINICAL DATA:  Fall with right hip pain.  Initial encounter. EXAM: DG HIP (WITH OR WITHOUT PELVIS) 2-3V RIGHT COMPARISON:  None. FINDINGS: Asymmetric lucency obliquely across the intertrochanteric right femur. No displacement. No pelvic ring fracture noted. Limited over the sacrum due to overlapping bowel gas. Osteopenia. IMPRESSION: Acute fracture across the intertrochanteric right femur. Extension to the cortex is not visualized and CT could confirm/characterize. Electronically Signed   By: Monte Fantasia M.D.   On: 12/15/2016 13:38    ECG: Atrial flutter with variable AV block, otherwise unremarkable  TELEMETRY: Atrial flutter with variable block  and average ventricular rate 100-110  IMPRESSION: 1. Atrial flutter with mild RVR (typical counterclockwise right atrial flutter) 2. Moderate aortic stenosis, likely not hemodynamically significant 3. S/P surgical repair of right hip fracture  RECOMMENDATION: 1. Continue intravenous diltiazem until he is able to restart oral diltiazem and his previous effective dose of 240 mg daily. Current mildly rapid heart rate is appropriate for a postop patient. 2. I can't be sure without direct review of the monitor during his surgery, but I would not be surprised if the heart rate was possibly overestimated: we are having the same problem  in the ICU. Before making drastic changes in his medications based on the monitor reported heart rate, I would print out a 12-lead tracing. It seems that the leads V3 and V4 provide the best discrimination between the flutter waves and the QRS complexes.  3. Repeat echo in a couple of days to assess the aortic stenosis. It may be difficult for the patient to turn over to provide good quality echo images so soon after surgery. By physical exam he does not have severe aortic stenosis. 4. Restart anticoagulation with Eliquis. Per my phone conversation with Dr. Percell Miller this can be started right away. There is no obvious bleeding at this time.   Time Spent Directly with Patient: 45 minutes  Sanda Klein, MD, [facility] [phone] office [phone] pager   12/15/2016, 8:15 PM

## 2016-12-15 NOTE — ED Provider Notes (Addendum)
[facility] DEPT Provider Note   CSN: [id number] Arrival date & time: 12/15/16  1014     History   Chief Complaint Chief Complaint  Patient presents with  . Fall    HPI Patrick Walker is a 83 y.o. male.  HPI Patient fell in his home. He reports he was just turning around in his office and somehow lost his balance and ended up falling against a dresser and then onto the floor. He reports the only place he has pain is in his right hip. He was unable to get back up. His wife found him on the floor called EMS. No known loss of consciousness. Patient denies headache. No apparent mental status change. No recent illness otherwise. Patient was feeling well before this incident. Past Medical History:  Diagnosis Date  . Aortic insufficiency    Mild, echo, November, 2011  . Aortic valve sclerosis    Echo, November, 2011  . Arthritis   . Atrial flutter [facility])    New diagnosis November, 2012  . Difficulty in urination   . Ejection fraction    EF 55-60%, echo, November, 2012 ( rapid atrial flutter at that time)  . Glaucoma   . Hearing loss   . Irritable bowel disease   . Shortness of breath    November, 2012  . Sinus bradycardia   . TSH (thyroid-stimulating hormone deficiency)    TSH 6.4,  November, 2012    Patient Active Problem List   Diagnosis Date Noted  . Malnutrition of moderate degree 02/24/2016  . Acute kidney injury ([place]) 02/23/2016  . Diverticulitis 02/23/2016  . Dehydration 02/23/2016  . Constipation 02/23/2016  . Nausea 02/23/2016  . Generalized weakness 02/23/2016  . Sinus bradycardia   . Long term current use of anticoagulant 08/04/2011  . Aortic valve sclerosis   . Aortic insufficiency   . Atrial flutter ([place])   . TSH (thyroid-stimulating hormone deficiency)   . Hearing loss   . Shortness of breath   . Inguinal hernia - left s/p lap [facility] repair [id number] 03/16/2011    Past Surgical History:  Procedure Laterality Date  . BACK SURGERY  2008-2009  . HAND  SURGERY  1984-85  . HERNIA REPAIR  03/02/2011  . LEG SURGERY  1972       Home Medications    Prior to Admission medications   Medication Sig Start Date End Date Taking? Authorizing Provider  apixaban (ELIQUIS) 2.5 MG TABS tablet Take 2.5 mg by mouth 2 (two) times daily.   Yes Historical Provider, MD  diltiazem (CARTIA XT) 240 MG 24 hr capsule Take 1 capsule (240 mg total) by mouth daily. NEED OV. 12/11/16  Yes Peter M Martinique, MD  erythromycin ophthalmic ointment Place 1 application into the left eye at bedtime.   Yes Historical Provider, MD  latanoprost (XALATAN) 0.005 % ophthalmic solution Place 1 drop into both eyes at bedtime.   Yes Historical Provider, MD  loperamide (IMODIUM A-D) 2 MG tablet Take 2 mg by mouth 4 (four) times daily as needed for diarrhea or loose stools.   Yes Historical Provider, MD  vitamin B-12 (CYANOCOBALAMIN) 100 MCG tablet Take 100 mcg by mouth daily.   Yes Historical Provider, MD  feeding supplement, ENSURE ENLIVE, (ENSURE ENLIVE) LIQD Take 237 mLs by mouth 3 (three) times daily between meals. Patient not taking: Reported on 03/27/2016 02/25/16   Lavina Hamman, MD  polyethylene glycol [facility]) packet Take 17 g by mouth daily. Patient not taking: Reported on 03/27/2016  02/25/16   Lavina Hamman, MD    Family History No family history on file.  Social History Social History  Substance Use Topics  . Smoking status: Never Smoker  . Smokeless tobacco: Not on file  . Alcohol use No     Allergies   Patient has no known allergies.   Review of Systems Review of Systems  All other systems reviewed and are negative.    Physical Exam Updated Vital Signs BP (!) 154/84   Pulse 76   Temp 97.5 F (36.4 C) (Oral)   Resp 17   SpO2 96%   Physical Exam  Constitutional: He is oriented to person, place, and time. He appears well-developed and well-nourished.  Patient is alert and interactive. No respiratory distress.  HENT:  Head: Normocephalic and  atraumatic.  Mouth/Throat: Oropharynx is clear and moist.  Eyes: EOM are normal. Pupils are equal, round, and reactive to light.  Neck: Neck supple.  No C-spine tenderness.  Cardiovascular:  Rate approximately 70-90 bpm irregular. 3/6 systolic ejection murmur. Monitor shows atrial flutter variable conduction from 4-1 and 2-1.  Pulmonary/Chest: Effort normal and breath sounds normal. He exhibits no tenderness.  Abdominal: Soft. He exhibits no distension. There is no tenderness. There is no guarding.  Musculoskeletal:  Right hip is externally rotated. Patient reports pain with any range of motion. No deformity at the knee or the ankle. No significant peripheral edema. Other extremities is normal intact range of motion.  Neurological: He is alert and oriented to person, place, and time. No cranial nerve deficit. He exhibits normal muscle tone. Coordination normal.  Skin: Skin is warm and dry.  Psychiatric: He has a normal mood and affect.     ED Treatments / Results  Labs (all labs ordered are listed, but only abnormal results are displayed) Labs Reviewed  BASIC METABOLIC PANEL - Abnormal; Notable for the following:       Result Value   Glucose, Bld 119 (*)    BUN 25 (*)    Creatinine, Ser 1.33 (*)    Calcium 8.8 (*)    GFR calc non Af Amer 46 (*)    GFR calc Af Amer 53 (*)    All other components within normal limits  CBC WITH DIFFERENTIAL/PLATELET - Abnormal; Notable for the following:    RBC 3.91 (*)    Hemoglobin 12.6 (*)    HCT 38.0 (*)    Lymphs Abs 0.5 (*)    All other components within normal limits  PROTIME-INR - Abnormal; Notable for the following:    Prothrombin Time 16.6 (*)    All other components within normal limits  URINALYSIS, ROUTINE W REFLEX MICROSCOPIC - Abnormal; Notable for the following:    Glucose, UA 50 (*)    Protein, ur 100 (*)    All other components within normal limits  I-STAT TROPOININ, ED    EKG  EKG Interpretation None        Radiology Dg Chest 1 View  Result Date: 12/15/2016 CLINICAL DATA:  [place] this morning and fractured right hip. EXAM: CHEST 1 VIEW COMPARISON:  Chest x-ray a 02/15/2014 FINDINGS: The deep cardiac silhouette, mediastinal and hilar contours are within normal limits. There is tortuosity and calcification of the thoracic aorta. Stable calcified pleural densities are noted bilaterally. There are stable emphysematous changes. Prominent skin fold noted over the left chest but no definite pneumothorax. Left basilar process appears to be a combination of small effusion and left lower lobe atelectasis or infiltrate.  The right lung is clear. The bony thorax is intact. IMPRESSION: Chronic emphysematous changes, pulmonary scarring and pleural calcifications. Left lower lobe process likely a combination of effusion and atelectasis or infiltrate. Electronically Signed   By: Marijo Sanes M.D.   On: 12/15/2016 13:30   Dg Hip Unilat  With Pelvis 2-3 Views Right  Result Date: 12/15/2016 CLINICAL DATA:  Fall with right hip pain.  Initial encounter. EXAM: DG HIP (WITH OR WITHOUT PELVIS) 2-3V RIGHT COMPARISON:  None. FINDINGS: Asymmetric lucency obliquely across the intertrochanteric right femur. No displacement. No pelvic ring fracture noted. Limited over the sacrum due to overlapping bowel gas. Osteopenia. IMPRESSION: Acute fracture across the intertrochanteric right femur. Extension to the cortex is not visualized and CT could confirm/characterize. Electronically Signed   By: Monte Fantasia M.D.   On: 12/15/2016 13:38    Procedures Procedures (including critical care time)  Medications Ordered in ED Medications  0.9 %  sodium chloride infusion ( Intravenous New Bag/Given 12/15/16 1333)  fentaNYL (SUBLIMAZE) injection 25 mcg (25 mcg Intravenous Given 12/15/16 1333)  fentaNYL (SUBLIMAZE) injection 50 mcg (50 mcg Intravenous Given 12/15/16 1515)     Initial Impression / Assessment and Plan / ED Course  I have  reviewed the triage vital signs and the nursing notes.  Pertinent labs & imaging results that were available during my care of the patient were reviewed by me and considered in my medical decision making (see chart for details).     Consult: (14:43) internal medicine teaching service for admission. Consult: (15:15) Dr. Fredonia Highland for orthopedics. Requests to maintain nothing by mouth status for possible surgical repair tonight. Final Clinical Impressions(s) / ED Diagnoses   Final diagnoses:  Closed fracture of right hip, initial encounter ([place])  Typical atrial flutter [facility])   Patient presents mechanical fall. He incidentally has atrial flutter with history of chronic atrial fibrillation. He does not appear to be symptomatic and rate is controlled. He will be admitted to medical service for medical clearance and management. Patient is being controlled with incremental doses of fentanyl. Mental status is clear. No other apparent traumatic injury. New Prescriptions New Prescriptions   No medications on file     Charlesetta Shanks, MD 12/15/16 [place], MD 12/15/16 [phone]

## 2016-12-15 NOTE — Anesthesia Procedure Notes (Signed)
Procedure Name: Intubation Date/Time: 12/15/2016 6:41 PM Performed by: Trixie Deis A Pre-anesthesia Checklist: Patient identified, Emergency Drugs available, Suction available and Patient being monitored Patient Re-evaluated:Patient Re-evaluated prior to inductionOxygen Delivery Method: Circle System Utilized Preoxygenation: Pre-oxygenation with 100% oxygen Intubation Type: IV induction Ventilation: Mask ventilation without difficulty Laryngoscope Size: Mac and 3 Grade View: Grade II Tube type: Oral Tube size: 7.5 mm Number of attempts: 1 Airway Equipment and Method: Stylet and Oral airway Placement Confirmation: ETT inserted through vocal cords under direct vision,  positive ETCO2 and breath sounds checked- equal and bilateral Secured at: 23 cm Tube secured with: Tape Dental Injury: Teeth and Oropharynx as per pre-operative assessment

## 2016-12-16 ENCOUNTER — Inpatient Hospital Stay (HOSPITAL_COMMUNITY): Payer: Medicare Other

## 2016-12-16 ENCOUNTER — Encounter (HOSPITAL_COMMUNITY): Payer: Self-pay | Admitting: Orthopedic Surgery

## 2016-12-16 DIAGNOSIS — S72141D Displaced intertrochanteric fracture of right femur, subsequent encounter for closed fracture with routine healing: Secondary | ICD-10-CM

## 2016-12-16 DIAGNOSIS — W01190D Fall on same level from slipping, tripping and stumbling with subsequent striking against furniture, subsequent encounter: Secondary | ICD-10-CM

## 2016-12-16 DIAGNOSIS — J90 Pleural effusion, not elsewhere classified: Secondary | ICD-10-CM

## 2016-12-16 LAB — BASIC METABOLIC PANEL
Anion gap: 8 (ref 5–15)
BUN: 24 mg/dL — AB (ref 6–20)
CO2: 23 mmol/L (ref 22–32)
Calcium: 8.3 mg/dL — ABNORMAL LOW (ref 8.9–10.3)
Chloride: 105 mmol/L (ref 101–111)
Creatinine, Ser: 1.59 mg/dL — ABNORMAL HIGH (ref 0.61–1.24)
GFR calc non Af Amer: 37 mL/min — ABNORMAL LOW (ref >60)
GFR, EST AFRICAN AMERICAN: 43 mL/min — AB (ref >60)
GLUCOSE: 141 mg/dL — AB (ref 65–99)
POTASSIUM: 5.3 mmol/L — AB (ref 3.5–5.1)
Sodium: 136 mmol/L (ref 135–145)

## 2016-12-16 LAB — CBC
HEMATOCRIT: 33 % — AB (ref 39.0–52.0)
Hemoglobin: 11 g/dL — ABNORMAL LOW (ref 13.0–17.0)
MCH: 32.5 pg (ref 26.0–34.0)
MCHC: 33.3 g/dL (ref 30.0–36.0)
MCV: 97.6 fL (ref 78.0–100.0)
Platelets: 142 10*3/uL — ABNORMAL LOW (ref 150–400)
RBC: 3.38 MIL/uL — ABNORMAL LOW (ref 4.22–5.81)
RDW: 13.2 % (ref 11.5–15.5)
WBC: 6.9 10*3/uL (ref 4.0–10.5)

## 2016-12-16 LAB — PROTEIN, TOTAL: Total Protein: 5.8 g/dL — ABNORMAL LOW (ref 6.5–8.1)

## 2016-12-16 LAB — MRSA PCR SCREENING: MRSA by PCR: POSITIVE — AB

## 2016-12-16 LAB — LACTATE DEHYDROGENASE: LDH: 170 U/L (ref 98–192)

## 2016-12-16 MED ORDER — HYDROMORPHONE HCL 1 MG/ML IJ SOLN
0.5000 mg | INTRAMUSCULAR | Status: DC | PRN
  Administered 2016-12-16 (×2): 0.5 mg via INTRAVENOUS
  Filled 2016-12-16 (×2): qty 1

## 2016-12-16 MED ORDER — ORAL CARE MOUTH RINSE
15.0000 mL | Freq: Two times a day (BID) | OROMUCOSAL | Status: DC
  Administered 2016-12-16 – 2016-12-18 (×3): 15 mL via OROMUCOSAL

## 2016-12-16 MED ORDER — APIXABAN 2.5 MG PO TABS
2.5000 mg | ORAL_TABLET | Freq: Two times a day (BID) | ORAL | Status: DC
  Administered 2016-12-16 (×2): 2.5 mg via ORAL
  Filled 2016-12-16 (×3): qty 1

## 2016-12-16 NOTE — Discharge Summary (Signed)
Name: Patrick Walker MRN: [id number] DOB: 29-Dec-1924 83 y.o. PCP: Leonard Downing, MD  Date of Admission: 12/15/2016 10:14 AM Date of Discharge: 12/18/2016 Attending Physician: Bartholomew Crews, MD  Discharge Diagnosis: 1. Intertrochanteric fracture of right femur s/p intramedullary nail placement 2. Atrial flutter 3. Aortic Stenosis  Active Problems:   Atrial flutter (HCC)   Hearing loss   Aortic valve sclerosis   Aortic insufficiency   Intertrochanteric fracture of right femur [facility])   Discharge Medications: Allergies as of 12/18/2016   No Known Allergies     Medication List    TAKE these medications   diltiazem 240 MG 24 hr capsule Commonly known as:  CARTIA XT Take 1 capsule (240 mg total) by mouth daily. NEED OV.   ELIQUIS 2.5 MG Tabs tablet Generic drug:  apixaban Take 2.5 mg by mouth 2 (two) times daily.   erythromycin ophthalmic ointment Place 1 application into the left eye at bedtime.   feeding supplement (ENSURE ENLIVE) Liqd Take 237 mLs by mouth 3 (three) times daily between meals.   HYDROcodone-acetaminophen 5-325 MG tablet Commonly known as:  NORCO/VICODIN Take 1 tablet by mouth every 6 (six) hours as needed for severe pain.   latanoprost 0.005 % ophthalmic solution Commonly known as:  XALATAN Place 1 drop into both eyes at bedtime.   loperamide 2 MG tablet Commonly known as:  IMODIUM A-D Take 2 mg by mouth 4 (four) times daily as needed for diarrhea or loose stools.   polyethylene glycol packet Commonly known as:  MIRALAX Take 17 g by mouth daily.   vitamin B-12 100 MCG tablet Commonly known as:  CYANOCOBALAMIN Take 100 mcg by mouth daily.       Disposition and follow-up:   Mr.J C Pettengill was discharged from [facility] in stable condition.  At the hospital follow up visit please address:  1.  Patient will need repeat Echo, follow up with Dr. Percival Spanish, Cardiology 2.  Patient needs follow up with Dr. Percell Miller,  orthopedic surgery 3.  Pleural effusion follow up with chest-xray  2.  Labs / imaging needed at time of follow-up: Chest x-ray, CBC and BMET assessing creatinine  3.  Pending labs/ test needing follow-up: urine culture  Follow-up Appointments:  Contact information for follow-up providers    MURPHY, TIMOTHY D, MD Follow up in 2 week(s).   Specialty:  Orthopedic Surgery Contact information: [place] [employer] [place] [address] [phone]        Minus Breeding, MD Follow up.   Specialty:  Cardiology Contact information: [address] [place] [address] [phone]            Contact information for after-discharge care    Destination    [facility] Follow up.   Specialty:  [place] Contact information: [address] [place] [facility] Course by problem list: Active Problems:   Atrial flutter (HCC)   Hearing loss   Aortic valve sclerosis   Aortic insufficiency   Intertrochanteric fracture of right femur (HCC)   Intertrochanteric right femur fracture s/p intramedullary nail placement Patient presented to the ED after a fall at home.  Imaging showed a right femur fracture.  Orthopedic surgery placed a right intertrochanteric intramedullary nail on day of admission, 4/17.  Patient was evaluated by PT and was able to  ambulate short distance with some assistance.  Patient continued to have right leg pain and was treated with Hydrocodone-acetaminophen [id number].  Patient was discharged with pain medication.  Acute on chronic kidney disease Patient's creatinine became elevated after surgery to 1.7.  After IV normal saline patient's creatinine improved to 1.5.  Patient will need a repeat BMP outpatient to assess creatinine.  Left pleural effusion On admission patient's chest x-ray showed a left lower lobe process likely an effusion that  showed progression the following day but stability on day 3.  Patient did not have clinical signs of a pneumonia.  He will need a follow up chest-ray after discharge to monitor the effusion.  History of Atrial Flutter On admission EKG showed atrial flutter.  On exam rates were controlled in the 80s.  Patient had an episode of atrial flutter in 2012 that reverted back to normal sinus rhythm spontaneously. Per chart review EKGs from 2013 - 2017 do not show atrial flutter.  Patient is on eliquis and diltiazem at home.  Cardiology was consulted during admission for questionable tachycardia during surgery. Patient was placed on a diltiazem drip after surgery.  He was restarted on eliquis and oral diltiazem the day following surgery.  Patient remained in atrial flutter with ventricular rates controlled throughout admission.  Patient will need to follow up with Cardiology outpatient.   Aortic Stenosis Last echo on 07/2015 showed LVEF of 60% to 65%. It was noted there was mildly calcified aortic valve with moderate stenosis.  Patient has an active lifestyle.  He denies episodes or syncope, shortness of breath or chest pain.  Echo will need to be repeated outpatient.  Discharge Vitals:   BP 120/71 (BP Location: Left Arm)   Pulse 99   Temp 97.4 F (36.3 C) (Oral)   Resp 16   Ht 5\' 2"  (1.575 m)   Wt 97 lb (44 kg)   SpO2 98%   BMI 17.74 kg/m   Pertinent Labs, Studies, and Procedures:  Intertrochanteric right intramedullary nail placement   Discharge Instructions: Discharge Instructions    Diet - low sodium heart healthy    Complete by:  As directed    Discharge instructions    Complete by:  As directed    Mr. Rosana Berger,  Please follow up with orthopedic surgery and cardiology after discharge.   Increase activity slowly    Complete by:  As directed       Signed: Valinda Party, DO 12/18/2016, 11:04 AM   Pager: [phone]

## 2016-12-16 NOTE — Consult Note (Signed)
Patient Name: Patrick Walker Date of Encounter: 12/16/2016  Primary Cardiologist: Dr. Martinique  Hospital Problem List     Active Problems:   Atrial flutter [facility])   Hearing loss   Aortic valve sclerosis   Aortic insufficiency   Intertrochanteric fracture of right femur (HCC)     Subjective   Woke from sleeping. No complaints. Mildly confused. Asking about paying the bills.   Inpatient Medications    Scheduled Meds: . apixaban  2.5 mg Oral BID  . diltiazem  240 mg Oral Daily  . sodium chloride flush  3 mL Intravenous Q12H  . vitamin B-12  100 mcg Oral Daily   Continuous Infusions: . diltiazem (CARDIZEM) infusion Stopped (12/16/16 0826)  . lactated ringers     PRN Meds: baclofen, HYDROcodone-acetaminophen, HYDROmorphone (DILAUDID) injection, menthol-cetylpyridinium **OR** phenol, ondansetron **OR** ondansetron (ZOFRAN) IV, polyethylene glycol, senna-docusate   Vital Signs    Vitals:   12/16/16 0828 12/16/16 0900 12/16/16 1000 12/16/16 1111  BP:  114/62 108/63   Pulse:  75 75   Resp:  13 13   Temp: 98.4 F (36.9 C)   98 F (36.7 C)  TempSrc: Oral   Oral  SpO2:  93% 97%     Intake/Output Summary (Last 24 hours) at 12/16/16 1342 Last data filed at 12/16/16 0826  Gross per 24 hour  Intake          1777.64 ml  Output              435 ml  Net          1342.64 ml   There were no vitals filed for this visit.  Physical Exam   GEN: Well nourished, well developed, in no acute distress.  HEENT: Grossly normal.  Neck: Supple, no JVD, carotid bruits, or masses. Cardiac: irreg irreg, 3/6 systolic murmurs, rubs, or gallops. No clubbing, cyanosis, edema.  Radials/DP/PT 2+ and equal bilaterally.  Respiratory:  Respirations regular and unlabored, clear to auscultation bilaterally. GI: Soft, nontender, nondistended, BS + x 4. MS: no deformity or atrophy. Right hip dressing Skin: warm and dry, no rash. Neuro:  Strength and sensation are intact. Psych: mildly confused    Labs    CBC  Recent Labs  12/15/16 1230 12/16/16 0744  WBC 6.7 6.9  NEUTROABS 5.5  --   HGB 12.6* 11.0*  HCT 38.0* 33.0*  MCV 97.2 97.6  PLT 156 263*   Basic Metabolic Panel  Recent Labs  12/15/16 1230 12/16/16 0744  NA 138 136  K 4.6 5.3*  CL 106 105  CO2 25 23  GLUCOSE 119* 141*  BUN 25* 24*  CREATININE 1.33* 1.59*  CALCIUM 8.8* 8.3*   Liver Function Tests  Recent Labs  12/16/16 1000  PROT 5.8*   No results for input(s): LIPASE, AMYLASE in the last 72 hours. Cardiac Enzymes No results for input(s): CKTOTAL, CKMB, CKMBINDEX, TROPONINI in the last 72 hours. BNP Invalid input(s): POCBNP D-Dimer No results for input(s): DDIMER in the last 72 hours. Hemoglobin A1C No results for input(s): HGBA1C in the last 72 hours. Fasting Lipid Panel No results for input(s): CHOL, HDL, LDLCALC, TRIG, CHOLHDL, LDLDIRECT in the last 72 hours. Thyroid Function Tests No results for input(s): TSH, T4TOTAL, T3FREE, THYROIDAB in the last 72 hours.  Invalid input(s): FREET3  Telemetry    Atrial flutter with CVR - Personally Reviewed  ECG    Aflutter with RVR with variable block HR 107 - Personally Reviewed  Radiology  Dg Chest 1 View  Result Date: 12/15/2016 CLINICAL DATA:  [place] this morning and fractured right hip. EXAM: CHEST 1 VIEW COMPARISON:  Chest x-ray a 02/15/2014 FINDINGS: The deep cardiac silhouette, mediastinal and hilar contours are within normal limits. There is tortuosity and calcification of the thoracic aorta. Stable calcified pleural densities are noted bilaterally. There are stable emphysematous changes. Prominent skin fold noted over the left chest but no definite pneumothorax. Left basilar process appears to be a combination of small effusion and left lower lobe atelectasis or infiltrate. The right lung is clear. The bony thorax is intact. IMPRESSION: Chronic emphysematous changes, pulmonary scarring and pleural calcifications. Left lower lobe process  likely a combination of effusion and atelectasis or infiltrate. Electronically Signed   By: Marijo Sanes M.D.   On: 12/15/2016 13:30   Pelvis Portable  Result Date: 12/15/2016 CLINICAL DATA:  Status post internal fixation of right femur fracture. Initial encounter. EXAM: PORTABLE PELVIS 1-2 VIEWS COMPARISON:  Right hip radiographs performed earlier today at 12:47 p.m. FINDINGS: There has been interval placement of an intramedullary nail and screw within the right femur, transfixing the intertrochanteric fracture in grossly anatomic alignment. The right femoral head remains seated at the acetabulum. The left hip joint is unremarkable. Scattered phleboliths are seen within the pelvis. Postoperative soft tissue air is seen at the right hip. IMPRESSION: Status post internal fixation of right femoral intertrochanteric fracture in grossly anatomic alignment. Electronically Signed   By: Garald Balding M.D.   On: 12/15/2016 22:59   Dg Chest Port 1 View  Result Date: 12/16/2016 CLINICAL DATA:  Difficulty breathing EXAM: PORTABLE CHEST 1 VIEW COMPARISON:  12/15/2016 FINDINGS: Cardiac shadow is stable. Aortic calcifications are again identified. Right midlung calcification is again identified and stable. Increasing left basilar infiltrate and effusion is seen no bony abnormality is noted. IMPRESSION: Progressive left lower lobe infiltrate and effusion. Electronically Signed   By: Inez Catalina M.D.   On: 12/16/2016 07:11   Dg C-arm 1-60 Min  Result Date: 12/15/2016 CLINICAL DATA:  Right femur surgery EXAM: OPERATIVE right HIP (WITH PELVIS IF PERFORMED) 3 VIEWS TECHNIQUE: Fluoroscopic spot image(s) were submitted for interpretation post-operatively. COMPARISON:  12/15/2016 FINDINGS: Total fluoroscopy time was 36 seconds. Three low resolution spot intraoperative images of the right hip. The images demonstrate intramedullary rodding of the right femur. IMPRESSION: Intraoperative fluoroscopic assistance provided during  surgical fixation of right femur fracture Electronically Signed   By: Donavan Foil M.D.   On: 12/15/2016 21:24   Dg Hip Operative Unilat W Or W/o Pelvis Right  Result Date: 12/15/2016 CLINICAL DATA:  Right femur surgery EXAM: OPERATIVE right HIP (WITH PELVIS IF PERFORMED) 3 VIEWS TECHNIQUE: Fluoroscopic spot image(s) were submitted for interpretation post-operatively. COMPARISON:  12/15/2016 FINDINGS: Total fluoroscopy time was 36 seconds. Three low resolution spot intraoperative images of the right hip. The images demonstrate intramedullary rodding of the right femur. IMPRESSION: Intraoperative fluoroscopic assistance provided during surgical fixation of right femur fracture Electronically Signed   By: Donavan Foil M.D.   On: 12/15/2016 21:24   Dg Hip Unilat  With Pelvis 2-3 Views Right  Result Date: 12/15/2016 CLINICAL DATA:  Fall with right hip pain.  Initial encounter. EXAM: DG HIP (WITH OR WITHOUT PELVIS) 2-3V RIGHT COMPARISON:  None. FINDINGS: Asymmetric lucency obliquely across the intertrochanteric right femur. No displacement. No pelvic ring fracture noted. Limited over the sacrum due to overlapping bowel gas. Osteopenia. IMPRESSION: Acute fracture across the intertrochanteric right femur. Extension to the cortex is  not visualized and CT could confirm/characterize. Electronically Signed   By: Monte Fantasia M.D.   On: 12/15/2016 13:38    Cardiac Studies   2D ECHO: 07/17/2015 LV EF: 60% -   65% Study Conclusions - Left ventricle: The cavity size was normal. Wall thickness was   normal. Systolic function was normal. The estimated ejection   fraction was in the range of 60% to 65%. Wall motion was normal;   there were no regional wall motion abnormalities. Doppler   parameters are consistent with abnormal left ventricular   relaxation (grade 1 diastolic dysfunction). The E/e&' ratio is   between 8-15, suggesting indeterminate LV filling pressure. - Aortic valve: Mildly calcified with  moderate stenosis. There was   mild regurgitation. Mean gradient (S): 15 mm Hg. Peak gradient   (S): 31 mm Hg. Valve area (VTI): 1.33 cm^2. - Mitral valve: Calcified annulus. Mildly thickened leaflets .   There was trivial regurgitation. - Left atrium: The atrium was normal in size. - Right atrium: The atrium was mildly dilated. - Tricuspid valve: There was mild regurgitation. - Pulmonary arteries: PA peak pressure: 31 mm Hg (S). - Inferior vena cava: The vessel was normal in size. The   respirophasic diameter changes were in the normal range (= 50%),   consistent with normal central venous pressure. Impressions - Comapared to a prior echo in 2012, there is now moderate aortic   stenosis. AVA around 1.3 cm2. LVEF 60-65%, mild AI and mild TR,   top normal RVSP.  Patient Profile     DREY SHAFF is a 83 y.o. male with a history of paroxysmal atrial flutter on Eliquis, mod AS/mild AI (last echo 07/2015, mean gradient 15 mmHg), preserved left ventricular systolic function with mild diastolic dysfunction who presented to [facility] on 12/15/16 after a fall and found to have a R intertrochanteric hip fracture. He went into atrial flutter with RVR during intra-medullary intratrochanteric nail placement by Dr. Percell Miller last night and cardiology consulted for help in management.   Assessment & Plan    Atrial flutter with RVR (typical counterclockwise right atrial flutter): he was placed on IV dilt. Dr. Sallyanne Kuster felt his HRs were being overestimated by tele. IV dilt now discontinued and back on home dose of diltiazem CD 240mg  daily with good rate control. HR in 80s -- Eliquis has already been resumed post operatively. CHADSVASC of at least 3 for (age, aortic athlersclerosis).   Aortic stenosis: Dr. Sallyanne Kuster recommended repeat echo in a couple of day to reassess AS.   Left pleural effusion: there was discussion about thoracentesis. He would need to be off of Eliquis for this.    Signed, Angelena Form,  PA-C  12/16/2016, 1:42 PM  As above, patient seen and examined. Patient is mildly confused. He denies dyspnea, chest pain, palpitations or syncope. I have personally reviewed his telemetry. He is in atrial flutter and his rate is controlled. Continue Cardizem at present dose. Continue apixaban. If thoracentesis is required this medication will need to be held for 2 days prior to procedure. We will plan an echocardiogram to reassess aortic stenosis but this could be performed as an outpatient. We will follow.  Kirk Ruths, MD

## 2016-12-16 NOTE — Progress Notes (Signed)
MD aware of the confusion. Son will be called for consent if he will not come by.

## 2016-12-16 NOTE — Progress Notes (Signed)
Slight confusion noted. MD paged

## 2016-12-16 NOTE — Progress Notes (Signed)
Physical Therapy Treatment Patient Details Name: Patrick Walker MRN: [id number] DOB: 11-19-1924 Today's Date: 12/16/2016    History of Present Illness 83 y.o. male s/p R IM nail following intertrochanteric fracture of the R femur from a fall at home. Pt with L pleural effusion. PMH significant for atrial flutter, aortic stenosis and HOH.     PT Comments    RN requested assist with transfer of pt back to bed as pt with worsening confusion. RN educated on chair and bed set up for optimal performance. Pt required MaxA x2 for transfer and bed mobility. PT will continue to follow.    Follow Up Recommendations  SNF;Supervision/Assistance - 24 hour     Equipment Recommendations  None recommended by PT    Recommendations for Other Services       Precautions / Restrictions Precautions Precautions: Fall Restrictions Weight Bearing Restrictions: Yes RLE Weight Bearing: Weight bearing as tolerated    Mobility  Bed Mobility Overal bed mobility: Needs Assistance Bed Mobility: Sit to Supine     Supine to sit: Max assist;HOB elevated;+2 for physical assistance;+2 for safety/equipment Sit to supine: Max assist;+2 for physical assistance;+2 for safety/equipment   General bed mobility comments: maxA to lower trunk and elevate LE to EOB due to pt resistance to movement. Pt required verbal cues for hand placement as pt reaching to grab onto things and prevent transition to supine. RN educated on assisting pt's LE to EOB and repositioning in bed  Transfers Overall transfer level: Needs assistance Equipment used: 2 person hand held assist Transfers: Stand Pivot Transfers   Stand pivot transfers: +2 physical assistance;Max assist       General transfer comment: maxA to power up. verbal cues for stepping sequence to steps. RN educated on chair/bed set up, assisting pt to edge of chair and facilitation of stand.  Ambulation/Gait             General Gait Details: limited to steps to  chair due to pain   Stairs            Wheelchair Mobility    Modified Rankin (Stroke Patients Only)       Balance Overall balance assessment: Needs assistance Sitting-balance support: Feet supported;No upper extremity supported Sitting balance-Leahy Scale: Fair Sitting balance - Comments: sat EOB to perform LE exercises without LOB   Standing balance support: Bilateral upper extremity supported Standing balance-Leahy Scale: Poor Standing balance comment: reliant on physical assist                            Cognition Arousal/Alertness: Awake/alert Behavior During Therapy: Anxious Overall Cognitive Status: Impaired/Different from baseline Area of Impairment: Orientation;Memory;Following commands;Problem solving                 Orientation Level: Disoriented to;Place   Memory: Decreased short-term memory Following Commands: Follows one step commands consistently     Problem Solving: Decreased initiation;Difficulty sequencing;Requires verbal cues;Requires tactile cues;Slow processing General Comments: pt with increased confusion and attempting to remove catheter, RN aware      Exercises General Exercises - Lower Extremity Quad Sets: Strengthening;Right;5 reps;Seated Long Arc Quad: AROM;Strengthening;Right;10 reps;Seated Heel Raises: AROM;Right;5 reps;Seated    General Comments        Pertinent Vitals/Pain Pain Assessment: 0-10 Pain Score: 9  Pain Location: R hip Pain Descriptors / Indicators: Discomfort;Grimacing;Guarding;Moaning Pain Intervention(s): Repositioned;Patient requesting pain meds-RN notified    Home Living Family/patient expects to be discharged to:: Private residence  Living Arrangements: Spouse/significant other Available Help at Discharge: Family;Available PRN/intermittently Type of Home: House Home Access: Stairs to enter Entrance Stairs-Rails: Right Home Layout: Two level;Able to live on main level with  bedroom/bathroom Home Equipment: Cane - single point Additional Comments: history taken from chart as pt unable to provide due to [facility] and difficulty with cognition    Prior Function Level of Independence: Independent      Comments: per pt and per chart, pt has cane, however, does not use. was driving a bulldozer and gardening PTA   PT Goals (current goals can now be found in the care plan section) Acute Rehab PT Goals Patient Stated Goal: none stated PT Goal Formulation: Patient unable to participate in goal setting Time For Goal Achievement: 12/30/16 Potential to Achieve Goals: Good    Frequency    Min 3X/week      PT Plan Current plan remains appropriate    Co-evaluation             End of Session Equipment Utilized During Treatment: Gait belt;Oxygen (4L O2) Activity Tolerance: Patient tolerated treatment well Patient left: in bed;with call bell/phone within reach;with bed alarm set;with nursing/sitter in room Nurse Communication: Mobility status PT Visit Diagnosis: Unsteadiness on feet (R26.81);Other abnormalities of gait and mobility (R26.89);Muscle weakness (generalized) (M62.81);History of falling (Z91.81);Difficulty in walking, not elsewhere classified (R26.2);Pain Pain - Right/Left: Right Pain - part of body: Hip     Time: 7654-6503 PT Time Calculation (min) (ACUTE ONLY): 9 min  Charges:  $Therapeutic Activity: 8-22 mins                    G Codes:       Tracie Harrier, SPT Acute Rehab SPT [phone]     Tracie Harrier 12/16/2016, 4:23 PM

## 2016-12-16 NOTE — Care Management Note (Signed)
Case Management Note  Patient Details  Name: Patrick Walker MRN: [id number] Date of Birth: [birth year]  Subjective/Objective:    Pt is s/p orth surgery - post distress Action/Plan:  PTA independent from home with wife - PT ordered due to falls  Expected Discharge Date:                  Expected Discharge Plan:  Skilled Nursing Facility  In-House Referral:  Clinical Social Work  Discharge planning Services  CM Consult  Post Acute Care Choice:    Choice offered to:     DME Arranged:    DME Agency:     HH Arranged:    [place] Agency:     Status of Service:  In process, will continue to follow  If discussed at Long Length of Stay Meetings, dates discussed:    Additional Comments:  Maryclare Labrador, RN 12/16/2016, 11:40 AM

## 2016-12-16 NOTE — Evaluation (Signed)
Physical Therapy Evaluation Patient Details Name: Patrick Walker MRN: [id number] DOB: [birth year] Today's Date: 12/16/2016   History of Present Illness  83 y.o. male s/p R IM nail following intertrochanteric fracture of the R femur from a fall at home. Pt with L pleural effusion. PMH significant for atrial flutter, aortic stenosis and HOH.   Clinical Impression  Pt presents with decreased strength, balance and activity tolerance secondary to above. Pt tolerated OOB mobility, however, was limited by pain. Pt required multimodal cues during LE exercises EOB and transfers secondary to slow processing and difficulty sequencing. Per chart, PTA pt was I, however, now requires maxA for all mobility secondary to impaired cognition, pain and above listed impairments. Recommend d/c to SNF for safe transition home when medically ready. Acute PT will follow.     Follow Up Recommendations SNF;Supervision/Assistance - 24 hour    Equipment Recommendations  None recommended by PT    Recommendations for Other Services       Precautions / Restrictions Precautions Precautions: Fall Restrictions Weight Bearing Restrictions: Yes RLE Weight Bearing: Weight bearing as tolerated      Mobility  Bed Mobility Overal bed mobility: Needs Assistance Bed Mobility: Supine to Sit     Supine to sit: Max assist;HOB elevated;+2 for physical assistance;+2 for safety/equipment     General bed mobility comments: maxA to elevate trunk and bring LE to EOB as pt was resistive to movement secondary to pain. use of bed pad to square hips to EOB. verbal cues for upright posture to maintain balance. Initially required minA to maintain balance, however, progressed to supervision  Transfers Overall transfer level: Needs assistance Equipment used: 2 person hand held assist (use of gait belt to facilitate stand) Transfers: Stand Pivot Transfers   Stand pivot transfers: +2 physical assistance;Max assist       General  transfer comment: modA to power up from surface, however, pt with decreased WB on RLE secondary to pain. verbal cues for trunk flexion and use of UE to come to stand. verbal cues for upright posture once standing and sequencing to take steps to chair. Pt requires facilitation of RLE movement and weight shift to R due to pain.   Ambulation/Gait             General Gait Details: limited to steps to chair due to pain  Stairs            Wheelchair Mobility    Modified Rankin (Stroke Patients Only)       Balance Overall balance assessment: Needs assistance Sitting-balance support: Feet supported;No upper extremity supported Sitting balance-Leahy Scale: Fair Sitting balance - Comments: sat EOB to perform LE exercises without LOB   Standing balance support: Bilateral upper extremity supported Standing balance-Leahy Scale: Poor Standing balance comment: reliant on physical assist                             Pertinent Vitals/Pain Pain Assessment: 0-10 Pain Score: 9  Pain Location: R hip Pain Descriptors / Indicators: Discomfort;Grimacing;Guarding;Moaning Pain Intervention(s): Limited activity within patient's tolerance;Monitored during session;Repositioned    Home Living Family/patient expects to be discharged to:: Private residence Living Arrangements: Spouse/significant other Available Help at Discharge: Family;Available PRN/intermittently Type of Home: House Home Access: Stairs to enter Entrance Stairs-Rails: Right Entrance Stairs-Number of Steps: 5 Home Layout: Two level;Able to live on main level with bedroom/bathroom Home Equipment: Kasandra Knudsen - single point Additional Comments: history taken from chart as pt  unable to provide due to [facility] and difficulty with cognition    Prior Function Level of Independence: Independent         Comments: per pt and per chart, pt has cane, however, does not use. was driving a Administrator, sports and gardening PTA     Hand  Dominance   Dominant Hand: Right    Extremity/Trunk Assessment   Upper Extremity Assessment Upper Extremity Assessment: Generalized weakness    Lower Extremity Assessment Lower Extremity Assessment: RLE deficits/detail RLE Deficits / Details: able to initiate quad set and perform >50% of LAQ. unable to initiate active hip flexion, however, can tolerate passive hip flexion through active plantarflexion against floor in seated    Cervical / Trunk Assessment Cervical / Trunk Assessment: Kyphotic  Communication   Communication: HOH  Cognition Arousal/Alertness: Awake/alert Behavior During Therapy: Anxious Overall Cognitive Status: Impaired/Different from baseline Area of Impairment: Orientation;Memory;Following commands;Problem solving                 Orientation Level: Disoriented to;Place   Memory: Decreased short-term memory Following Commands: Follows one step commands consistently     Problem Solving: Decreased initiation;Difficulty sequencing;Requires verbal cues;Requires tactile cues;Slow processing General Comments: pt stated "I'm having trouble thinking" pt requires verbal cues to stay on task. Pt follows one step commands consistently during transfer, however, requires cues for sequencing to transfer from bed to chair.      General Comments      Exercises General Exercises - Lower Extremity Quad Sets: Strengthening;Right;5 reps;Seated Long Arc Quad: AROM;Strengthening;Right;10 reps;Seated Heel Raises: AROM;Right;5 reps;Seated   Assessment/Plan    PT Assessment Patient needs continued PT services  PT Problem List Decreased strength;Decreased range of motion;Decreased activity tolerance;Decreased balance;Decreased mobility;Decreased cognition;Decreased knowledge of use of DME;Decreased safety awareness;Pain       PT Treatment Interventions DME instruction;Gait training;Stair training;Functional mobility training;Therapeutic activities;Therapeutic  exercise;Balance training;Neuromuscular re-education;Cognitive remediation;Patient/family education    PT Goals (Current goals can be found in the Care Plan section)  Acute Rehab PT Goals Patient Stated Goal: none stated PT Goal Formulation: Patient unable to participate in goal setting Time For Goal Achievement: 12/30/16 Potential to Achieve Goals: Good    Frequency Min 3X/week   Barriers to discharge        Co-evaluation               End of Session Equipment Utilized During Treatment: Gait belt;Oxygen (4L O2) Activity Tolerance: Patient tolerated treatment well Patient left: in chair;with call bell/phone within reach Nurse Communication: Mobility status PT Visit Diagnosis: Unsteadiness on feet (R26.81);Other abnormalities of gait and mobility (R26.89);Muscle weakness (generalized) (M62.81);History of falling (Z91.81);Difficulty in walking, not elsewhere classified (R26.2);Pain Pain - Right/Left: Right Pain - part of body: Hip    Time: 8099-8338 PT Time Calculation (min) (ACUTE ONLY): 34 min   Charges:   PT Evaluation $PT Eval Moderate Complexity: 1 Procedure PT Treatments $Therapeutic Activity: 8-22 mins   PT G Codes:        Tracie Harrier, SPT Acute Rehab SPT [phone]    Tracie Harrier 12/16/2016, 3:28 PM

## 2016-12-16 NOTE — Progress Notes (Signed)
IR consulted for thoracentesis of left pleural effusion.  Patient is intermittently confused.   Called patient's wife to obtain consent but no answer at only number listed in chart.  RN aware.  Will assist in obtaining consent if able and will plan for possible procedure tomorrow.   Brynda Greathouse, MS RD PA-C 3:34 PM

## 2016-12-16 NOTE — Progress Notes (Signed)
   Subjective: Patient was evaluated this morning on rounds. Patient reports having surgery yesterday without complications. He reports right lower extremity pain with movement.  He denies chest pain, heart palpitations, or shortness of breath.  Objective:  Vital signs in last 24 hours: Vitals:   12/16/16 0800 12/16/16 0828 12/16/16 0900 12/16/16 1000  BP: 107/62  114/62 108/63  Pulse: 75  75 75  Resp: 12  13 13   Temp:  98.4 F (36.9 C)    TempSrc:  Oral    SpO2: 93%  93% 97%   Physical Exam  Constitutional: He is well-developed, well-nourished, and in no distress.  Cardiovascular:  Irregular rhythm, non-tachycardic, 3/6 systolic ejection murmur   Pulmonary/Chest:  Clear to ausculation on anterior and lateral chest wall bilaterally  Abdominal: Soft. Bowel sounds are normal. He exhibits no distension. There is no tenderness.  Musculoskeletal:  Patient reports pain with any range of motion of right lower extremity. Right incision site with no signs of infection or ecchymosis   Neurological: He is alert.  Skin: Skin is warm and dry.  Psychiatric: Mood and affect normal.     Assessment/Plan:  Active Problems:   Atrial flutter (HCC)   Hearing loss   Aortic valve sclerosis   Aortic insufficiency   Intertrochanteric fracture of right femur (HCC)  Intertrochanteric right femur fracture Patient had a right intertrochanteric intramedullary nail placed yesterday afternoon.  Today is day 1 postop and patient reports moderate pain to the right lower extremity.  During surgery it was noted he had problems with tachycardia.  He was extubated after the procedure and transferred to the the ICU and placed on diltiazem drip. Patient appeared comfortable on exam this morning.  -advance diet as tolerated - weight bearing as tolerated - incentive spirometry  - PT evaluation   Left pleural effusion Patient's repeat chest x-ray shows a left sided  pleural effusion that has progressed since  admission. Will get an ultrasound guided thoracentesis to assess fluid. - ultrasound guided throacentesis  Atrial flutter Cardiology was consulted yesterday after problems with tachycardia during surgery.  It was thought that the patient's heart rates may have been overestimated with the monitor.  Patients heart rates prior to surgery were in the 80s yesterday on exam while the monitor was reporting heart rates in the 180s due to the atrial flutter.  Currently patient's heart rates are well controlled.  Patient's diltiazem drip was stopped this morning.  Will restart eliquis and oral diltiazem today. - Appreciate Cardiology's recommendations - restart eliquis  - start oral diltiazem - transfer to step down from ICU  Aortic Stenosis Last echo on 07/2015 showed LVEF of 60% to 65%. It was noted there was mildly calcified aortic valve with moderate stenosis.  Patient has an active lifestyle.  He denies episodes or syncope, shortness of breath or chest pain.  Will plan to get a repeat Echo this admission to reassess aortic stenosis. - Echo this admission   Dispo: Anticipated discharge pending clinical improvement.   Valinda Party, DO 12/16/2016, 10:46 AM Pager: [phone]

## 2016-12-16 NOTE — Progress Notes (Addendum)
   Assessment: 1 Day Post-Op  S/P Procedure(s) (LRB): INTRAMEDULLARY (IM) NAIL INTERTROCHANTRIC (Right) by Dr. Ernesta Amble. Murphy on 12/15/16  Active Problems:   Atrial flutter ([place])   Hearing loss   Aortic valve sclerosis   Aortic insufficiency   Intertrochanteric fracture of right femur ([place])  Doing well early POD1 from an orthopedic perspective.  Pain moderate.   Labs pending.  HR controlled on Diltiazem drip.    Plan: Advance diet as tolerated Up with therapy Incentive Spirometry Apply ice Continue plan per medicine / cardiology  Weight Bearing: Weight Bearing as Tolerated (WBAT)  Dressings: Dry dressing PRN.  VTE prophylaxis: Eliquis - okay to start home dosing, SCDs, ambulation Dispo: Per primary.  PT eval pending.  Likely will need Skilled Nursing Facility/Rehab.  Previously active and ambulatory without assistive devices.  Subjective: Patient reports pain as moderate. Pain controlled with IV and PO meds.  Tolerating some liquids - appetite decreased.  Urinating. Not yet OOB.  Objective:   VITALS:   Vitals:   12/16/16 0417 12/16/16 0500 12/16/16 0600 12/16/16 0700  BP:  119/67 109/62 (!) 103/59  Pulse:  76 76 76  Resp:  11 (!) 8 11  Temp: 97.6 F (36.4 C)     TempSrc: Oral     SpO2:  96% 95% 93%   CBC Latest Ref Rng & Units 12/15/2016 02/25/2016 02/24/2016  WBC 4.0 - 10.5 K/uL 6.7 3.7(L) 5.1  Hemoglobin 13.0 - 17.0 g/dL 12.6(L) 11.9(L) 11.6(L)  Hematocrit 39.0 - 52.0 % 38.0(L) 36.3(L) 35.4(L)  Platelets 150 - 400 K/uL 156 210 204   BMP Latest Ref Rng & Units 12/15/2016 02/25/2016 02/24/2016  Glucose 65 - 99 mg/dL 119(H) 101(H) 101(H)  BUN 6 - 20 mg/dL 25(H) 12 19  Creatinine 0.61 - 1.24 mg/dL 1.33(H) 1.16 1.31(H)  Sodium 135 - 145 mmol/L 138 141 137  Potassium 3.5 - 5.1 mmol/L 4.6 3.1(L) 3.8  Chloride 101 - 111 mmol/L 106 101 99(L)  CO2 22 - 32 mmol/L 25 30 29   Calcium 8.9 - 10.3 mg/dL 8.8(L) 9.2 9.3   Intake/Output      04/17 0701 - 04/18 0700 04/18 0701  - 04/19 0700   I.V. 1669.4    IV Piggyback 100    Total Intake 1769.4     Urine 385    Blood 50    Total Output 435     Net +1334.4            Physical Exam: General: NAD.  Supine in bed.  Calm, interactive. MSK Neurovascularly intact Sensation intact distally Feet warm Dorsiflexion/Plantar flexion intact Incision: dressing C/D/I with scant drainage.   Charna Elizabeth Martensen III, PA-C 12/16/2016, 7:44 AM

## 2016-12-16 NOTE — Progress Notes (Signed)
Internal Medicine Attending:   I saw and examined the patient. I reviewed the resident's note and I agree with the resident's findings and plan as documented in the resident's note.  Patient feels well today. He does complain of pain at surgical site when he moves. No chest pain, no palpitations, no shortness of breath, no fevers. He is now status post IM nail for his right intertrochanteric fracture postoperative day #1. Orthopedics follow-up and recommendations appreciated. PT to evaluate patient today.  Patient was monitored in the ICU overnight secondary to worsening tachycardia intraoperatively secondary to a flutter. Cardiology follow-up and recommendations appreciated. Patient heart rate is now well controlled with diltiazem. Diltiazem drip has now been titrated off. Patient will need a 2-D echo in the next couple of days to assess his heart valve for severity of aortic stenosis. He'll also need outpatient follow-up with cardiology once discharged. Continue with Eliquis and oral diltiazem for now.  Patient also noted to have a worsening left sided pleural effusion with an underlying infiltrate. There is concern for possible pneumonia but he has no respiratory symptoms and no fevers and no leukocytosis. We will consider ultrasound-guided thoracentesis to assess for an exudative effusion. Monitor off antibiotics for now. Patient is stable for transfer to stepdown.

## 2016-12-17 ENCOUNTER — Encounter (HOSPITAL_COMMUNITY): Payer: Self-pay | Admitting: *Deleted

## 2016-12-17 ENCOUNTER — Inpatient Hospital Stay (HOSPITAL_COMMUNITY): Payer: Medicare Other

## 2016-12-17 DIAGNOSIS — N189 Chronic kidney disease, unspecified: Secondary | ICD-10-CM

## 2016-12-17 DIAGNOSIS — N179 Acute kidney failure, unspecified: Secondary | ICD-10-CM

## 2016-12-17 DIAGNOSIS — R410 Disorientation, unspecified: Secondary | ICD-10-CM

## 2016-12-17 DIAGNOSIS — I4892 Unspecified atrial flutter: Secondary | ICD-10-CM

## 2016-12-17 DIAGNOSIS — X58XXXD Exposure to other specified factors, subsequent encounter: Secondary | ICD-10-CM

## 2016-12-17 LAB — BASIC METABOLIC PANEL
Anion gap: 8 (ref 5–15)
BUN: 31 mg/dL — AB (ref 6–20)
CALCIUM: 8.5 mg/dL — AB (ref 8.9–10.3)
CHLORIDE: 104 mmol/L (ref 101–111)
CO2: 24 mmol/L (ref 22–32)
CREATININE: 1.76 mg/dL — AB (ref 0.61–1.24)
GFR calc Af Amer: 38 mL/min — ABNORMAL LOW (ref >60)
GFR calc non Af Amer: 33 mL/min — ABNORMAL LOW (ref >60)
Glucose, Bld: 108 mg/dL — ABNORMAL HIGH (ref 65–99)
Potassium: 5.1 mmol/L (ref 3.5–5.1)
SODIUM: 136 mmol/L (ref 135–145)

## 2016-12-17 LAB — CBC
HEMATOCRIT: 33.7 % — AB (ref 39.0–52.0)
HEMOGLOBIN: 11.1 g/dL — AB (ref 13.0–17.0)
MCH: 32.3 pg (ref 26.0–34.0)
MCHC: 32.9 g/dL (ref 30.0–36.0)
MCV: 98 fL (ref 78.0–100.0)
Platelets: 127 10*3/uL — ABNORMAL LOW (ref 150–400)
RBC: 3.44 MIL/uL — ABNORMAL LOW (ref 4.22–5.81)
RDW: 13.2 % (ref 11.5–15.5)
WBC: 7.1 10*3/uL (ref 4.0–10.5)

## 2016-12-17 LAB — URINALYSIS, ROUTINE W REFLEX MICROSCOPIC
Bilirubin Urine: NEGATIVE
Glucose, UA: 50 mg/dL — AB
Ketones, ur: NEGATIVE mg/dL
Leukocytes, UA: NEGATIVE
Nitrite: NEGATIVE
PROTEIN: 100 mg/dL — AB
SQUAMOUS EPITHELIAL / LPF: NONE SEEN
Specific Gravity, Urine: 1.018 (ref 1.005–1.030)
pH: 5 (ref 5.0–8.0)

## 2016-12-17 MED ORDER — SODIUM CHLORIDE 0.9 % IV SOLN
INTRAVENOUS | Status: AC
  Administered 2016-12-17: 07:00:00 via INTRAVENOUS

## 2016-12-17 MED ORDER — CHLORHEXIDINE GLUCONATE CLOTH 2 % EX PADS
6.0000 | MEDICATED_PAD | Freq: Every day | CUTANEOUS | Status: DC
  Administered 2016-12-18: 6 via TOPICAL

## 2016-12-17 MED ORDER — MUPIROCIN 2 % EX OINT
1.0000 "application " | TOPICAL_OINTMENT | Freq: Two times a day (BID) | CUTANEOUS | Status: DC
  Administered 2016-12-17 – 2016-12-18 (×2): 1 via NASAL
  Filled 2016-12-17 (×2): qty 22

## 2016-12-17 MED ORDER — APIXABAN 2.5 MG PO TABS
2.5000 mg | ORAL_TABLET | Freq: Two times a day (BID) | ORAL | Status: DC
  Administered 2016-12-17 – 2016-12-18 (×3): 2.5 mg via ORAL
  Filled 2016-12-17 (×4): qty 1

## 2016-12-17 NOTE — Progress Notes (Signed)
  Date: 12/17/2016  Patient name: Patrick Walker  Medical record number: [id number]  Date of birth: 1925-02-02   I have seen and evaluated this patient and I have discussed the plan of care with the house staff. Please see their note for complete details. I concur with their findings with the following additions/corrections: [name] was sitting in a recliner visiting with his son during morning rounds. He denied any complaints, specifically hip pain, chest pain, dyspnea, and cough. On examination, he had good airflow bilaterally without crackles. He was not using accessory muscles to breathe. Cardiac exam irr irr with 3/6 systolic murmur. He had no lower extremity edema. The team and I reviewed his chest x-rays and independently. The chest x-rays from the 17th 18 were overpenetrated which made the congestion seen today seem more impressive than his volume status clinically.  Active problems 1. Left pleural effusion - this is a radiographic finding and the patient is asymptomatic. A thoracentesis had been planned to rule out a parapneumonic effusion. The patient has no overt infiltrate on chest x-ray, he is afebrile, he does not have a leukocytosis, and he has no signs or symptoms of a pneumonia. A thoracentesis would require holding his anticoagulation which is worrisome as he is high risk for a DVT/PE since he is postop and not fully mobile. Therefore I think the risk of a thoracentesis outweighed the benefits and we are not going to proceed with thoracentesis as an inpatient. The effusion will need to be followed up as an outpatient.  2. Acute renal failure - patient is making urine and a post void residual ruled out obstructive causes. Intrarenal causes, particularly ATN, will be assessed by urine microscopy. Blood pressures during surgery did appear to drop transiently. Prerenal causes are being treated with IV fluids. I do not feel that the patient is volume overloaded and therefore can tolerate  IV fluids. Creatinine will be checked daily for trend.  3. Delirium - this seems to be worse overnight according to nursing notes. Currently he is calm and focused and can follow commands. Pain is being well controlled inc with IV hydromorphone, blinds are open, he is out of bed, nursing staff is frequently reorienting patient, and we are going to de-escalate care including removing telemetry. A UA will be obtained to rule out a urinary tract infection.  Other medical issues per Dr. Jodene Nam progress note.  Bartholomew Crews, MD 12/17/2016, 1:24 PM

## 2016-12-17 NOTE — NC FL2 (Signed)
[place] LEVEL OF CARE SCREENING TOOL     IDENTIFICATION  Patient Name: Patrick Walker Birthdate: 1925-06-11 Sex: male Admission Date (Current Location): 12/15/2016  [facility] and [place] Number:  Herbalist and Address:  [facility], [place] [address] [place], [place] [address]      Provider Number: [id number]  Attending Physician Name and Address:  Bartholomew Crews, MD  Relative Name and Phone Number:  Raidyn Wassink (Spouse) [phone]    Current Level of Care: Hospital Recommended Level of Care: [place] Prior Approval Number:    Date Approved/Denied:   PASRR Number: [id number]  Discharge Plan: SNF    Current Diagnoses: Patient Active Problem List   Diagnosis Date Noted  . Intertrochanteric fracture of right femur ([place]) 12/15/2016  . Malnutrition of moderate degree 02/24/2016  . Acute kidney injury ([place]) 02/23/2016  . Diverticulitis 02/23/2016  . Dehydration 02/23/2016  . Constipation 02/23/2016  . Nausea 02/23/2016  . Generalized weakness 02/23/2016  . Sinus bradycardia   . Long term current use of anticoagulant 08/04/2011  . Aortic valve sclerosis   . Aortic insufficiency   . Atrial flutter ([place])   . TSH (thyroid-stimulating hormone deficiency)   . Hearing loss   . Shortness of breath   . Inguinal hernia - left s/p lap [facility] repair [id number] 03/16/2011    Orientation RESPIRATION BLADDER Height & Weight     Self, Place  O2 Incontinent Weight: 97 lb (44 kg) Height:  5\' 2"  (157.5 cm)  BEHAVIORAL SYMPTOMS/MOOD NEUROLOGICAL BOWEL NUTRITION STATUS   (NONE)  (NONE)   Diet  AMBULATORY STATUS COMMUNICATION OF NEEDS Skin   Extensive Assist Verbally Surgical wounds (closed right hip incision- adhesive bandage)                       Personal Care Assistance Level of Assistance  Bathing, Feeding, Dressing Bathing Assistance: Limited assistance Feeding assistance: Independent Dressing  Assistance: Limited assistance     Functional Limitations Info  Sight, Hearing, Speech Sight Info: Adequate Hearing Info: Impaired Speech Info: Adequate    SPECIAL CARE FACTORS FREQUENCY  PT (By licensed PT), OT (By licensed OT)     PT Frequency: min 3x/week OT Frequency: min 2x/week            Contractures Contractures Info: Not present    Additional Factors Info  Code Status, Allergies, Isolation Precautions Code Status Info: DNR Allergies Info: No Known Allergies     Isolation Precautions Info: Contact Precautions     Current Medications (12/17/2016):  This is the current hospital active medication list Current Facility-Administered Medications  Medication Dose Route Frequency Provider Last Rate Last Dose  . 0.9 %  sodium chloride infusion   Intravenous Continuous Jessica Ratliff Hoffman, DO 100 mL/hr at 12/17/16 0800    . baclofen (LIORESAL) tablet 5 mg  5 mg Oral TID PRN Charna Elizabeth Martensen III, PA-C      . diltiazem (CARDIZEM CD) 24 hr capsule 240 mg  240 mg Oral Daily Zada Finders, MD   240 mg at 12/16/16 0935  . HYDROcodone-acetaminophen (NORCO/VICODIN) 5-325 MG per tablet 1-2 tablet  1-2 tablet Oral Q6H PRN Prudencio Burly III, PA-C   2 tablet at 12/17/16 0534  . HYDROmorphone (DILAUDID) injection 0.5 mg  0.5 mg Intravenous Q3H PRN Charna Elizabeth Martensen III, PA-C   0.5 mg at 12/16/16 1617  . MEDLINE mouth rinse  15 mL Mouth Rinse  BID Aldine Contes, MD   15 mL at 12/16/16 2200  . menthol-cetylpyridinium (CEPACOL) lozenge 3 mg  1 lozenge Oral PRN Charna Elizabeth Martensen III, PA-C       Or  . phenol (CHLORASEPTIC) mouth spray 1 spray  1 spray Mouth/Throat PRN Charna Elizabeth Martensen III, PA-C      . ondansetron (ZOFRAN) tablet 4 mg  4 mg Oral Q6H PRN Zada Finders, MD       Or  . ondansetron (ZOFRAN) injection 4 mg  4 mg Intravenous Q6H PRN Zada Finders, MD   4 mg at 12/15/16 1912  . senna-docusate (Senokot-S) tablet 1 tablet  1 tablet Oral QHS PRN Zada Finders, MD      . sodium chloride flush (NS) 0.9 % injection 3 mL  3 mL Intravenous Q12H Zada Finders, MD   3 mL at 12/16/16 2109  . vitamin B-12 (CYANOCOBALAMIN) tablet 100 mcg  100 mcg Oral Daily Zada Finders, MD   100 mcg at 12/16/16 0935     Discharge Medications: Please see discharge summary for a list of discharge medications.  Relevant Imaging Results:  Relevant Lab Results:   Additional Information SS # [id number]  Lind Covert, LCSW

## 2016-12-17 NOTE — Progress Notes (Signed)
   Assessment: 2 Days Post-Op  S/P Procedure(s) (LRB): INTRAMEDULLARY (IM) NAIL INTERTROCHANTRIC (Right) by Dr. Ernesta Amble. Murphy on 12/15/16  Active Problems:   Atrial flutter ([place])   Hearing loss   Aortic valve sclerosis   Aortic insufficiency   Intertrochanteric fracture of right femur [facility])  Doing well from an orthopedic perspective.  Hgb stable.  Increased confusion overnight.  Pain moderate.    Plan: Up with therapy Incentive Spirometry Apply ice Continue plan per medicine / cardiology  Weight Bearing: Weight Bearing as Tolerated (WBAT)  Dressings: Dry dressing PRN.  VTE prophylaxis: Eliquis - held for thoracentesis, SCDs, ambulation Dispo: Per primary.  Likely will need Skilled Nursing Facility/Rehab.  Previously active and ambulatory without assistive devices.  Subjective: Patient reports pain as moderate. Pain controlled with IV and PO meds.  Taking in some PO.  Urinating.  OOB to chair w/ PT.  Objective:   VITALS:   Vitals:   12/17/16 0500 12/17/16 0600 12/17/16 0700 12/17/16 0716  BP: 120/72 128/78 (!) 157/72   Pulse: 76 76 (!) 51   Resp: 14 (!) 9 13   Temp:    97.5 F (36.4 C)  TempSrc:    Oral  SpO2: 98% 97% 93%    CBC Latest Ref Rng & Units 12/17/2016 12/16/2016 12/15/2016  WBC 4.0 - 10.5 K/uL 7.1 6.9 6.7  Hemoglobin 13.0 - 17.0 g/dL 11.1(L) 11.0(L) 12.6(L)  Hematocrit 39.0 - 52.0 % 33.7(L) 33.0(L) 38.0(L)  Platelets 150 - 400 K/uL 127(L) 142(L) 156   BMP Latest Ref Rng & Units 12/17/2016 12/16/2016 12/15/2016  Glucose 65 - 99 mg/dL 108(H) 141(H) 119(H)  BUN 6 - 20 mg/dL 31(H) 24(H) 25(H)  Creatinine 0.61 - 1.24 mg/dL 1.76(H) 1.59(H) 1.33(H)  Sodium 135 - 145 mmol/L 136 136 138  Potassium 3.5 - 5.1 mmol/L 5.1 5.3(H) 4.6  Chloride 101 - 111 mmol/L 104 105 106  CO2 22 - 32 mmol/L 24 23 25   Calcium 8.9 - 10.3 mg/dL 8.5(L) 8.3(L) 8.8(L)   Intake/Output      04/18 0701 - 04/19 0700 04/19 0701 - 04/20 0700   P.O. 240    I.V. 268.3    IV Piggyback     Total Intake 508.3     Urine 420    Blood     Total Output 420     Net +88.3          Urine Occurrence 1 x      Physical Exam: General: NAD.  Supine in bed.  Calm, interactive.  Oriented to person and place, but confused about reason for admission. MSK Neurovascularly intact Sensation intact distally Feet warm Dorsiflexion/Plantar flexion intact Incision: dressing C/D/I with scant drainage.   Charna Elizabeth Martensen III, PA-C 12/17/2016, 7:47 AM

## 2016-12-17 NOTE — Evaluation (Signed)
Occupational Therapy Evaluation Patient Details Name: Patrick Walker MRN: [id number] DOB: 08/26/1925 Today's Date: 12/17/2016    History of Present Illness 83 y.o. male s/p R IM nail following intertrochanteric fracture of the R femur from a fall at home. Pt with L pleural effusion. PMH significant for atrial flutter, aortic stenosis and HOH.    Clinical Impression   Per chart review, pt independent with ADL PTA. Currently pt overall mod assist +2 for safety with functional mobility and min-max assist for ADL. Pt presenting with pain, LE weakness, decreased activity tolerance, deconditioning impacting his independence and safety with ADL and functional mobility. Recommending SNF for follow up to maximize independence and safety with ADL and functional mobility prior to return home. Pt would benefit from continued skilled OT to address established goals.    Follow Up Recommendations  SNF;Supervision/Assistance - 24 hour    Equipment Recommendations  Other (comment) (TBD at next venue)    Recommendations for Other Services       Precautions / Restrictions Precautions Precautions: Fall Restrictions Weight Bearing Restrictions: Yes RLE Weight Bearing: Weight bearing as tolerated      Mobility Bed Mobility Overal bed mobility: Needs Assistance Bed Mobility: Supine to Sit     Supine to sit: Max assist     General bed mobility comments: Assist for LEs to EOB and for trunk elevation to sitting. Use of bed pad to scoot hips to EOB.  Transfers Overall transfer level: Needs assistance Equipment used: Rolling walker (2 wheeled) Transfers: Sit to/from Stand   Stand pivot transfers: Mod assist;+2 physical assistance;+2 safety/equipment       General transfer comment: Mod assist +2 to boost up from EOB. Cues for hand placement and full upright posture once in standing. Pt with posterior lean but able to correct with cues.    Balance Overall balance assessment: Needs  assistance Sitting-balance support: Feet supported;Bilateral upper extremity supported Sitting balance-Leahy Scale: Fair   Postural control: Posterior lean Standing balance support: Bilateral upper extremity supported Standing balance-Leahy Scale: Poor Standing balance comment: bil UE support and mod assist to maintain standing balance                           ADL either performed or assessed with clinical judgement   ADL Overall ADL's : Needs assistance/impaired     Grooming: Minimal assistance;Sitting   Upper Body Bathing: Minimal assistance;Sitting   Lower Body Bathing: Maximal assistance;Sit to/from stand   Upper Body Dressing : Moderate assistance;Sitting   Lower Body Dressing: Maximal assistance;Sit to/from stand   Toilet Transfer: Moderate assistance;+2 for safety/equipment;Ambulation;RW Toilet Transfer Details (indicate cue type and reason): Simulated by sit to stand from EOB with functional mobility in room         Functional mobility during ADLs: Moderate assistance;Rolling walker;+2 for safety/equipment       Vision         Perception     Praxis      Pertinent Vitals/Pain Pain Assessment: Faces Faces Pain Scale: Hurts little more Pain Location: R hip Pain Descriptors / Indicators: Grimacing;Guarding;Sore Pain Intervention(s): Monitored during session;Limited activity within patient's tolerance;Repositioned     Hand Dominance Right   Extremity/Trunk Assessment Upper Extremity Assessment Upper Extremity Assessment: Generalized weakness   Lower Extremity Assessment Lower Extremity Assessment: Defer to PT evaluation   Cervical / Trunk Assessment Cervical / Trunk Assessment: Kyphotic   Communication Communication Communication: HOH   Cognition Arousal/Alertness: Awake/alert Behavior During Therapy:  WFL for tasks assessed/performed                                   General Comments: Pt alert and oriented x3, x4  after reorientation. Following commands appropriately but pt HOH so occasionally needing to repeat cues.   General Comments       Exercises     Shoulder Instructions      Home Living Family/patient expects to be discharged to:: Private residence Living Arrangements: Spouse/significant other Available Help at Discharge: Family;Available PRN/intermittently Type of Home: House Home Access: Stairs to enter [employer] of Steps: 5 Entrance Stairs-Rails: Right Home Layout: Two level;Able to live on main level with bedroom/bathroom     Bathroom Shower/Tub: Occupational psychologist: Standard     Home Equipment: Cane - single point   Additional Comments: history taken from prior admission      Prior Functioning/Environment Level of Independence: Independent        Comments: per pt and per chart, pt has cane, however, does not use. was driving a bulldozer and gardening PTA        OT Problem List: Decreased strength;Decreased activity tolerance;Impaired balance (sitting and/or standing);Decreased knowledge of use of DME or AE;Decreased knowledge of precautions;Cardiopulmonary status limiting activity;Pain      OT Treatment/Interventions: Self-care/ADL training;Therapeutic exercise;Energy conservation;DME and/or AE instruction;Therapeutic activities;Patient/family education;Balance training    OT Goals(Current goals can be found in the care plan section) Acute Rehab OT Goals Patient Stated Goal: get better OT Goal Formulation: With patient Time For Goal Achievement: 12/31/16 Potential to Achieve Goals: Good ADL Goals Pt Will Perform Grooming: with min guard assist;standing Pt Will Perform Lower Body Bathing: with min guard assist;sit to/from stand Pt Will Perform Lower Body Dressing: with min guard assist;sit to/from stand Pt Will Transfer to Toilet: with min guard assist;ambulating;bedside commode (over toilet) Pt Will Perform Toileting - Clothing  Manipulation and hygiene: with min guard assist;sit to/from stand  OT Frequency: Min 2X/week   Barriers to D/C:            Co-evaluation PT/OT/SLP Co-Evaluation/Treatment: Yes Reason for Co-Treatment: For patient/therapist safety;To address functional/ADL transfers   OT goals addressed during session: ADL's and self-care      End of Session Equipment Utilized During Treatment: Gait belt;Rolling walker;Oxygen Nurse Communication: Mobility status;Weight bearing status  Activity Tolerance: Patient tolerated treatment well Patient left: in chair;with call bell/phone within reach;with chair alarm set;with family/visitor present  OT Visit Diagnosis: Other abnormalities of gait and mobility (R26.89);History of falling (Z91.81);Pain Pain - Right/Left: Right Pain - part of body: Hip                Time: 0800-0825 OT Time Calculation (min): 25 min Charges:  OT General Charges $OT Visit: 1 Procedure OT Evaluation $OT Eval Moderate Complexity: 1 Procedure G-Codes:     Deb Loudin A. Ulice Brilliant, M.S., OTR/L Pager: [place] 12/17/2016, 8:37 AM

## 2016-12-17 NOTE — Progress Notes (Signed)
Subjective: Patient was evaluated this morning on rounds. He was sitting up in a recliner chair. He reports mild pain to his right lower extremity with movement but overall reports the pain is controlled.  He denies shortness of breath or chest pain.  Objective:  Vital signs in last 24 hours: Vitals:   12/17/16 0600 12/17/16 0700 12/17/16 0716 12/17/16 0800  BP: 128/78 (!) 157/72  (!) 157/72  Pulse: 76 (!) 51  (!) 51  Resp: (!) 9 13  15   Temp:   97.5 F (36.4 C)   TempSrc:   Oral   SpO2: 97% 93%  96%  Weight:    97 lb (44 kg)  Height:    5\' 2"  (1.575 m)   Physical Exam  Constitutional: He is well-developed, well-nourished, and in no distress.  Cardiovascular:  Irregular rhythm, non-tachycardic, 3/6 systolic ejection murmur   Pulmonary/Chest: Effort normal and breath sounds normal. No respiratory distress. He has no wheezes. He has no rales.  Musculoskeletal:  Patient reports pain with any range of motion of right lower extremity. Right incision site with no signs of infection or ecchymosis     Neurological: He is alert.  Skin: Skin is warm and dry.  Psychiatric: Mood and affect normal.    Assessment/Plan:  Active Problems:   Atrial flutter (HCC)   Hearing loss   Aortic valve sclerosis   Aortic insufficiency   Intertrochanteric fracture of right femur (HCC)  Intertrochanteric right femur fracture s/p intramedullary nail placed Patient had surgery 4/17.  Today is day 2 postop and patient reports mild pain to the right lower extremity.  Patient appears comfortable on exam this morning.  Patient was evaluated by PT this morning and able to ambulate short distance with some assistance.  Patient would benefit from SNF for rehab post surgery. - weight bearing as tolerated - incentive spirometry  - PT - Hydrocodone-acetaminophen QH6  Delerium Patient has needed redirection often as he has become disoriented while in the hospital.  Patient is afebrile without a  leukocytosis.  Patient does not have a cough and chest x-ray does not show signs of a pneumonia. Patient had an episode of incontinence, will get a UA. -Urinalysis and culture    Acute on Chronic Kidney Injury Creatinine has been rising and currently 1.7 from 1.5 yesterday.  Bladder scan shows 120cc.  BUN is elevated at 31.  Suspect patient is volume depleted.   -IV NS  100cc/hr    Left pleural effusion Patient's chest x-ray this morning shows no change in small left pleural effusion.   - monitor, Chest x-ray in the morning  Atrial flutter Currently patient is in atrial flutter with heart rates well controlled. Patient is on oral diltiazem and eliquis. Upon review of telemetry patient has had rate controlled atrial flutter with heart rates between 80 and 90 maximum 105. There was a spike on the monitor that noted heart rates in the 160s but upon further review the ventricular rate was controlled thus the 160 heart rate isn't accurate.  At this point patient's heart rates have been controlled and do not see the further need for telemetry.  - Appreciate Cardiology's recommendations - oral diltiazem - transfer to med/surg bed out of ICU is pending  Aortic Stenosis Last echo on 07/2015 showed LVEF of 60% to 65%. It was noted there was mildly calcified aortic valve with moderate stenosis. Patient has an active lifestyle. He denies episodes or syncope, shortness of breath or chest pain.  Will plan to get a repeat Echo as outpatient. - Echo as outpatient  Dispo: Anticipated discharge pending clinical improvement and SNF placement.   Valinda Party, DO 12/17/2016, 10:10 AM Pager: [phone]

## 2016-12-17 NOTE — Progress Notes (Signed)
Transitions of Care Pharmacy Note  Plan:  Ensure patient is on appropriate dose of Eliquis at discharge Will follow up with SNF within 48 hours of discharge to ensure successful transition of care  --------------------------------------------- Patrick Walker is an 83 y.o. male who presents with a chief complaint of right hip fracture. In anticipation of discharge, pharmacy has reviewed this patient's prior to admission medication history, as well as current inpatient medications listed per the [facility].  Patrick Walker had a difficult time hearing me and was a poor historian. He knows he takes Eliquis (and states he cuts these tablets in half per his MD), but could not recall taking diltiazem. Will need to follow-up with SNF. Spoke with IMTS to let them know patient was cutting his Eliquis tablets in half and that patients with his renal function were technically excluded from the trials, but he should be fine once his renal function improves to baseline.   Assessment: Understanding of regimen: fair Understanding of indications: poor Potential of compliance: poor Barriers to Obtaining Medications: No  Patient instructed to contact inpatient pharmacy team with further questions or concerns if needed.    Time spent preparing for discharge counseling: 20 Time spent counseling patient: 10    Thank you for allowing pharmacy to be a part of this patient's care.  Dierdre Harness, Cain Sieve, PharmD Clinical Pharmacy Resident [phone] (Pager) 12/17/2016 7:02 PM

## 2016-12-17 NOTE — Progress Notes (Addendum)
Physical Therapy Treatment Patient Details Name: Patrick Walker MRN: [id number] DOB: March 19, 1925 Today's Date: 12/17/2016    History of Present Illness 83 y.o. male s/p R IM nail following intertrochanteric fracture of the R femur from a fall at home. Pt with L pleural effusion. PMH significant for atrial flutter, aortic stenosis and HOH.     PT Comments    Pt pleasantly confused with initial disorientation to day of the week. Pt able to progress mobility requiring less assist with transfers and able to ambulate short distance today with chair follow. Pt educated for mobility progression and HEP with encouragement to continue HEP and OOB daily with nursing assist. Will continue to follow.   HR 132 with gait O2 sats 92% on 4-6L   Follow Up Recommendations  SNF;Supervision/Assistance - 24 hour     Equipment Recommendations       Recommendations for Other Services       Precautions / Restrictions Precautions Precautions: Fall Restrictions Weight Bearing Restrictions: Yes RLE Weight Bearing: Weight bearing as tolerated    Mobility  Bed Mobility Overal bed mobility: Needs Assistance Bed Mobility: Supine to Sit     Supine to sit: Mod assist;HOB elevated     General bed mobility comments: Assist for LEs to EOB and for trunk elevation to sitting. Use of bed pad to scoot hips to EOB. Increased time with pt cued for use of LLE to assist RLE  Transfers Overall transfer level: Needs assistance Equipment used: Rolling walker (2 wheeled) Transfers: Sit to/from Stand Sit to Stand: Mod assist;+2 safety/equipment;+2 physical assistance;From elevated surface Stand pivot transfers: Mod assist;+2 physical assistance;+2 safety/equipment       General transfer comment: Mod assist +2 to boost up from EOB. Cues for hand placement and full upright posture once in standing. Pt with posterior lean but able to correct with cues.  Ambulation/Gait Ambulation/Gait assistance: Mod assist;+2  safety/equipment Ambulation Distance (Feet): 22 Feet Assistive device: Rolling walker (2 wheeled) Gait Pattern/deviations: Step-to pattern;Decreased stride length;Trunk flexed   Gait velocity interpretation: Below normal speed for age/gender General Gait Details: cues for sequence with assist to advance RW and initially to advance RLE with chair to follow   Stairs            Wheelchair Mobility    Modified Rankin (Stroke Patients Only)       Balance Overall balance assessment: Needs assistance;History of Falls Sitting-balance support: Feet supported;Bilateral upper extremity supported Sitting balance-Leahy Scale: Fair   Postural control: Posterior lean Standing balance support: Bilateral upper extremity supported Standing balance-Leahy Scale: Poor Standing balance comment: bil UE support and mod assist to maintain standing balance                            Cognition Arousal/Alertness: Awake/alert Behavior During Therapy: WFL for tasks assessed/performed Overall Cognitive Status: Impaired/Different from baseline                       Memory: Decreased short-term memory Following Commands: Follows one step commands consistently     Problem Solving: Slow processing;Decreased initiation General Comments: Pt alert and oriented x3, x4 after reorientation. Following commands appropriately but pt HOH so occasionally needing to repeat cues.      Exercises General Exercises - Lower Extremity Long Arc Quad: AROM;Strengthening;10 reps;Seated;Both Hip Flexion/Marching: AROM;Both;Seated;10 reps    General Comments        Pertinent Vitals/Pain Pain Assessment: Faces Faces Pain  Scale: Hurts little more Pain Location: R hip Pain Descriptors / Indicators: Grimacing;Guarding;Sore Pain Intervention(s): Monitored during session;Limited activity within patient's tolerance;Repositioned    Home Living Family/patient expects to be discharged to:: Private  residence Living Arrangements: Spouse/significant other Available Help at Discharge: Family;Available PRN/intermittently Type of Home: House Home Access: Stairs to enter Entrance Stairs-Rails: Right Home Layout: Two level;Able to live on main level with bedroom/bathroom Home Equipment: Cane - single point Additional Comments: history taken from prior admission    Prior Function Level of Independence: Independent      Comments: per pt and per chart, pt has cane, however, does not use. was driving a bulldozer and gardening PTA   PT Goals (current goals can now be found in the care plan section) Acute Rehab PT Goals Patient Stated Goal: get better Progress towards PT goals: Progressing toward goals    Frequency    Min 3X/week      PT Plan Current plan remains appropriate    Co-evaluation PT/OT/SLP Co-Evaluation/Treatment: Yes Reason for Co-Treatment: For patient/therapist safety PT goals addressed during session: Balance;Proper use of DME;Strengthening/ROM;Mobility/safety with mobility OT goals addressed during session: ADL's and self-care     End of Session Equipment Utilized During Treatment: Gait belt;Oxygen Activity Tolerance: Patient tolerated treatment well Patient left: in chair;with call bell/phone within Walker;with family/visitor present;with nursing/sitter in room;with chair alarm set Nurse Communication: Mobility status PT Visit Diagnosis: Unsteadiness on feet (R26.81);Difficulty in walking, not elsewhere classified (R26.2);Pain Pain - Right/Left: Right Pain - part of body: Hip     Time: 0801-0825 PT Time Calculation (min) (ACUTE ONLY): 24 min  Charges:  $Gait Training: 8-22 mins                    G Codes:       Patrick Walker, PT [phone]  [place] Jodi Kappes 12/17/2016, 9:55 AM

## 2016-12-17 NOTE — Consult Note (Signed)
Patient Name: Patrick Walker Date of Encounter: 12/17/2016  Primary Cardiologist: Dr. Martinique  Hospital Problem List     Active Problems:   Atrial flutter [facility])   Hearing loss   Aortic valve sclerosis   Aortic insufficiency   Intertrochanteric fracture of right femur (HCC)     Subjective   No chest pain or dyspnea  Inpatient Medications    Scheduled Meds: . diltiazem  240 mg Oral Daily  . mouth rinse  15 mL Mouth Rinse BID  . sodium chloride flush  3 mL Intravenous Q12H  . vitamin B-12  100 mcg Oral Daily   Continuous Infusions: . sodium chloride 100 mL/hr at 12/17/16 1000   PRN Meds: baclofen, HYDROcodone-acetaminophen, HYDROmorphone (DILAUDID) injection, menthol-cetylpyridinium **OR** phenol, ondansetron **OR** ondansetron (ZOFRAN) IV, senna-docusate   Vital Signs    Vitals:   12/17/16 0600 12/17/16 0700 12/17/16 0716 12/17/16 0800  BP: 128/78 (!) 157/72  (!) 157/72  Pulse: 76 (!) 51  (!) 51  Resp: (!) 9 13  15   Temp:   97.5 F (36.4 C)   TempSrc:   Oral   SpO2: 97% 93%  96%  Weight:    97 lb (44 kg)  Height:    5\' 2"  (1.575 m)    Intake/Output Summary (Last 24 hours) at 12/17/16 1053 Last data filed at 12/17/16 1000  Gross per 24 hour  Intake           481.67 ml  Output              370 ml  Net           111.67 ml   Filed Weights   12/17/16 0800  Weight: 97 lb (44 kg)    Physical Exam   GEN: Well nourished, frail, in no acute distress.  HEENT: Grossly normal.  Neck: Supple Cardiac: irreg irreg, 3/6 systolic murmurs Respiratory:  Mildly diminished BS bases GI: Soft, nontender, nondistended MS: Right hip dressing; no edema Skin: warm and dry, no rash. Neuro:  Strength and sensation are intact.  Labs    CBC  Recent Labs  12/15/16 1230 12/16/16 0744 12/17/16 0149  WBC 6.7 6.9 7.1  NEUTROABS 5.5  --   --   HGB 12.6* 11.0* 11.1*  HCT 38.0* 33.0* 33.7*  MCV 97.2 97.6 98.0  PLT 156 142* 539*   Basic Metabolic Panel  Recent Labs  12/16/16 0744 12/17/16 0149  NA 136 136  K 5.3* 5.1  CL 105 104  CO2 23 24  GLUCOSE 141* 108*  BUN 24* 31*  CREATININE 1.59* 1.76*  CALCIUM 8.3* 8.5*   Liver Function Tests  Recent Labs  12/16/16 1000  PROT 5.8*    Telemetry    Atrial flutter with CVR - Personally Reviewed   Radiology    Dg Chest 1 View  Result Date: 12/15/2016 CLINICAL DATA:  [place] this morning and fractured right hip. EXAM: CHEST 1 VIEW COMPARISON:  Chest x-ray a 02/15/2014 FINDINGS: The deep cardiac silhouette, mediastinal and hilar contours are within normal limits. There is tortuosity and calcification of the thoracic aorta. Stable calcified pleural densities are noted bilaterally. There are stable emphysematous changes. Prominent skin fold noted over the left chest but no definite pneumothorax. Left basilar process appears to be a combination of small effusion and left lower lobe atelectasis or infiltrate. The right lung is clear. The bony thorax is intact. IMPRESSION: Chronic emphysematous changes, pulmonary scarring and pleural calcifications. Left lower lobe process likely  a combination of effusion and atelectasis or infiltrate. Electronically Signed   By: Marijo Sanes M.D.   On: 12/15/2016 13:30   Pelvis Portable  Result Date: 12/15/2016 CLINICAL DATA:  Status post internal fixation of right femur fracture. Initial encounter. EXAM: PORTABLE PELVIS 1-2 VIEWS COMPARISON:  Right hip radiographs performed earlier today at 12:47 p.m. FINDINGS: There has been interval placement of an intramedullary nail and screw within the right femur, transfixing the intertrochanteric fracture in grossly anatomic alignment. The right femoral head remains seated at the acetabulum. The left hip joint is unremarkable. Scattered phleboliths are seen within the pelvis. Postoperative soft tissue air is seen at the right hip. IMPRESSION: Status post internal fixation of right femoral intertrochanteric fracture in grossly anatomic  alignment. Electronically Signed   By: Garald Balding M.D.   On: 12/15/2016 22:59   Dg Chest Port 1 View  Result Date: 12/17/2016 CLINICAL DATA:  Shortness of breath. EXAM: PORTABLE CHEST 1 VIEW COMPARISON:  A single view of the chest 12/11/2016. PA and lateral chest 02/15/2017. FINDINGS: The lungs are emphysematous. There is a small left pleural effusion and left basilar airspace disease. Cardiomegaly is identified with pulmonary vascular congestion. Aortic atherosclerosis is noted. Scattered calcified granulomata are again seen. IMPRESSION: No change in a small left pleural effusion and basilar airspace disease. Cardiomegaly and new pulmonary vascular congestion. Atherosclerosis. Electronically Signed   By: Inge Rise M.D.   On: 12/17/2016 07:49   Dg Chest Port 1 View  Result Date: 12/16/2016 CLINICAL DATA:  Difficulty breathing EXAM: PORTABLE CHEST 1 VIEW COMPARISON:  12/15/2016 FINDINGS: Cardiac shadow is stable. Aortic calcifications are again identified. Right midlung calcification is again identified and stable. Increasing left basilar infiltrate and effusion is seen no bony abnormality is noted. IMPRESSION: Progressive left lower lobe infiltrate and effusion. Electronically Signed   By: Inez Catalina M.D.   On: 12/16/2016 07:11   Dg C-arm 1-60 Min  Result Date: 12/15/2016 CLINICAL DATA:  Right femur surgery EXAM: OPERATIVE right HIP (WITH PELVIS IF PERFORMED) 3 VIEWS TECHNIQUE: Fluoroscopic spot image(s) were submitted for interpretation post-operatively. COMPARISON:  12/15/2016 FINDINGS: Total fluoroscopy time was 36 seconds. Three low resolution spot intraoperative images of the right hip. The images demonstrate intramedullary rodding of the right femur. IMPRESSION: Intraoperative fluoroscopic assistance provided during surgical fixation of right femur fracture Electronically Signed   By: Donavan Foil M.D.   On: 12/15/2016 21:24   Dg Hip Operative Unilat W Or W/o Pelvis Right  Result  Date: 12/15/2016 CLINICAL DATA:  Right femur surgery EXAM: OPERATIVE right HIP (WITH PELVIS IF PERFORMED) 3 VIEWS TECHNIQUE: Fluoroscopic spot image(s) were submitted for interpretation post-operatively. COMPARISON:  12/15/2016 FINDINGS: Total fluoroscopy time was 36 seconds. Three low resolution spot intraoperative images of the right hip. The images demonstrate intramedullary rodding of the right femur. IMPRESSION: Intraoperative fluoroscopic assistance provided during surgical fixation of right femur fracture Electronically Signed   By: Donavan Foil M.D.   On: 12/15/2016 21:24   Dg Hip Unilat  With Pelvis 2-3 Views Right  Result Date: 12/15/2016 CLINICAL DATA:  Fall with right hip pain.  Initial encounter. EXAM: DG HIP (WITH OR WITHOUT PELVIS) 2-3V RIGHT COMPARISON:  None. FINDINGS: Asymmetric lucency obliquely across the intertrochanteric right femur. No displacement. No pelvic ring fracture noted. Limited over the sacrum due to overlapping bowel gas. Osteopenia. IMPRESSION: Acute fracture across the intertrochanteric right femur. Extension to the cortex is not visualized and CT could confirm/characterize. Electronically Signed   By: Angelica Chessman  Watts M.D.   On: 12/15/2016 13:38    Cardiac Studies   2D ECHO: 07/17/2015 LV EF: 60% -   65% Study Conclusions - Left ventricle: The cavity size was normal. Wall thickness was   normal. Systolic function was normal. The estimated ejection   fraction was in the range of 60% to 65%. Wall motion was normal;   there were no regional wall motion abnormalities. Doppler   parameters are consistent with abnormal left ventricular   relaxation (grade 1 diastolic dysfunction). The E/e&' ratio is   between 8-15, suggesting indeterminate LV filling pressure. - Aortic valve: Mildly calcified with moderate stenosis. There was   mild regurgitation. Mean gradient (S): 15 mm Hg. Peak gradient   (S): 31 mm Hg. Valve area (VTI): 1.33 cm^2. - Mitral valve: Calcified  annulus. Mildly thickened leaflets .   There was trivial regurgitation. - Left atrium: The atrium was normal in size. - Right atrium: The atrium was mildly dilated. - Tricuspid valve: There was mild regurgitation. - Pulmonary arteries: PA peak pressure: 31 mm Hg (S). - Inferior vena cava: The vessel was normal in size. The   respirophasic diameter changes were in the normal range (= 50%),   consistent with normal central venous pressure. Impressions - Comapared to a prior echo in 2012, there is now moderate aortic   stenosis. AVA around 1.3 cm2. LVEF 60-65%, mild AI and mild TR,   top normal RVSP.  Patient Profile     Patrick Walker is a 83 y.o. male with a history of paroxysmal atrial flutter on Eliquis, mod AS/mild AI (last echo 07/2015, mean gradient 15 mmHg), preserved left ventricular systolic function with mild diastolic dysfunction who presented to [facility] on 12/15/16 after a fall and found to have a R intertrochanteric hip fracture. He went into atrial flutter with RVR during intra-medullary intratrochanteric nail placement by Dr. Percell Miller and cardiology consulted for help in management.   Assessment & Plan    Atrial flutter with RVR (typical counterclockwise right atrial flutter): pt with h/o atrial flutter and is asymptomatic; would continue cardizem at present dose. -- CHADSVASC of at least 3 for (age, aortic athlersclerosis). Would resume apixaban 2.5 mg BID when all procedures complete.  Aortic stenosis: Would arrange fu echo after DC and then fu with Dr Percival Spanish.   Left pleural effusion: management per primary care; thoracentesis planned  We will sign off; please call with questions.  Signed, Kirk Ruths, MD  12/17/2016, 10:53 AM

## 2016-12-17 NOTE — Clinical Social Work Note (Signed)
Clinical Social Work Assessment  Patient Details  Name: Patrick Walker MRN: [id number] Date of Birth: [birth year]  Date of referral:  12/17/16               Reason for consult:  Facility Placement                Permission sought to share information with:  Family Supports Permission granted to share information::  Yes, Verbal Permission Granted  Name::     Everton Bertha (Spouse) [phone]; Asaiah Scarber [facility]) [phone]  Agency::     Relationship::     Contact Information:     Housing/Transportation Living arrangements for the past 2 months:  Single Family Home Source of Information:  Spouse, Medical Team Patient Interpreter Needed:  None Criminal Activity/Legal Involvement Pertinent to Current Situation/Hospitalization:  No - Comment as needed Significant Relationships:  Spouse, Adult Children, Other Family Members, Friend Lives with:  Spouse Do you feel safe going back to the place where you live?  Yes Need for family participation in patient care:  Yes (Comment)  Care giving concerns:  Patient now requiring  assist for all mobility secondary to impaired cognition, pain, and physical impairments. SNF being recommended.   Social Worker assessment / plan:  Patient is an 83 year old male with history of atrial flutter on Cardizem and eliquis, moderate aortic stenosis, and diverticulitis that presents to the ED after a fall at home. CSW engaged with son at Patient's bedside as patient is pleasantly confused. CSW introduced self, role of CSW, and discussed PT's recommendation for SNF placement. Per chart review, Patient reports he was in his home office when he lost his balance and fell backwards against a dresser than landed on the floor. Patient was found on the floor by wife as he was unable to move and transferred by EMS. Family reports he is constantly active and was at his baseline physical and mental status at the time of the fall. Patient's family denies a history of falls and  does not use a walker or cane at home.  He was gardening prior to admission and on a bulldozer last week. CSW to facilitate SNF referrals and discharge to SNF when medically appropriate. Patient's family requesting [facility] if bed available and offer extended.   Employment status:  Retired Forensic scientist:  [employer] PT Recommendations:  [place], [place] / Referral to community resources:  [place]  Patient/Family's Response to care:  Patient's family appreciative of care received at this time.   Patient/Family's Understanding of and Emotional Response to Diagnosis, Current Treatment, and Prognosis:  Patient's son able to verbalize strong understanding of diagnosis, current treatment, and prognosis. Patient's family agreeable to SNF placement at discharge for further rehab.   Emotional Assessment Appearance:  Appears stated age Attitude/Demeanor/Rapport:  Unable to Assess (Patient with increased confusion) Affect (typically observed):  Unable to Assess (Patient with increased confusion) Orientation:  Oriented to Self, Oriented to Place Alcohol / Substance use:  Not Applicable Psych involvement (Current and /or in the community):  No (Comment)  Discharge Needs  Concerns to be addressed:  Discharge Planning Concerns, Care Coordination Readmission within the last 30 days:  No Current discharge risk:  Physical Impairment Barriers to Discharge:  Continued Medical Work up   Lind Covert, LCSW 12/17/2016, 9:14 AM

## 2016-12-17 NOTE — Clinical Social Work Placement (Addendum)
   CLINICAL SOCIAL WORK PLACEMENT  NOTE  Date:  12/17/2016  Patient Details  Name: Patrick Walker MRN: [id number] Date of Birth: 1924/11/26  Clinical Social Work is seeking post-discharge placement for this patient at the [place] level of care (*CSW will initial, date and re-position this form in  chart as items are completed):  Yes   Patient/family provided with [place] Work Department's list of facilities offering this level of care within the geographic area requested by the patient (or if unable, by the patient's family).  Yes   Patient/family informed of their freedom to choose among providers that offer the needed level of care, that participate in Medicare, Medicaid or managed care program needed by the patient, have an available bed and are willing to accept the patient.  Yes   Patient/family informed of [place]'s ownership interest in [facility] and [facility], as well as of the fact that they are under no obligation to receive care at these facilities.  PASRR submitted to EDS on 12/17/16     PASRR number received on 12/17/16     Existing PASRR number confirmed on       FL2 transmitted to all facilities in geographic area requested by pt/family on 12/17/16     FL2 transmitted to all facilities within larger geographic area on 12/17/16     Patient informed that his/her managed care company has contracts with or will negotiate with certain facilities, including the following:            Patient/family informed of bed offers received.  Patient chooses bed at   [facility]    Physician recommends and patient chooses bed at      Patient to be transferred to   on  .  Patient to be transferred to facility by  [facility]     Patient family notified on  12/18/16 of transfer.  Name of family member notified:        PHYSICIAN Please sign FL2, Please sign DNR, Please prepare prescriptions, Please prepare priority  discharge summary, including medications     Additional Comment:    _______________________________________________ Lind Covert, LCSW 12/17/2016, 9:11 AM

## 2016-12-18 DIAGNOSIS — H9190 Unspecified hearing loss, unspecified ear: Secondary | ICD-10-CM

## 2016-12-18 LAB — BASIC METABOLIC PANEL
ANION GAP: 12 (ref 5–15)
BUN: 30 mg/dL — ABNORMAL HIGH (ref 6–20)
CO2: 22 mmol/L (ref 22–32)
Calcium: 8.9 mg/dL (ref 8.9–10.3)
Chloride: 104 mmol/L (ref 101–111)
Creatinine, Ser: 1.55 mg/dL — ABNORMAL HIGH (ref 0.61–1.24)
GFR calc Af Amer: 44 mL/min — ABNORMAL LOW (ref >60)
GFR calc non Af Amer: 38 mL/min — ABNORMAL LOW (ref >60)
GLUCOSE: 110 mg/dL — AB (ref 65–99)
POTASSIUM: 4.3 mmol/L (ref 3.5–5.1)
Sodium: 138 mmol/L (ref 135–145)

## 2016-12-18 LAB — CBC
HCT: 30.8 % — ABNORMAL LOW (ref 39.0–52.0)
HEMOGLOBIN: 10.1 g/dL — AB (ref 13.0–17.0)
MCH: 32.1 pg (ref 26.0–34.0)
MCHC: 32.8 g/dL (ref 30.0–36.0)
MCV: 97.8 fL (ref 78.0–100.0)
Platelets: 105 10*3/uL — ABNORMAL LOW (ref 150–400)
RBC: 3.15 MIL/uL — ABNORMAL LOW (ref 4.22–5.81)
RDW: 13.1 % (ref 11.5–15.5)
WBC: 7.1 10*3/uL (ref 4.0–10.5)

## 2016-12-18 LAB — URINE CULTURE: Culture: NO GROWTH

## 2016-12-18 MED ORDER — SODIUM CHLORIDE 0.9 % IV SOLN
INTRAVENOUS | Status: AC
  Administered 2016-12-18: 11:00:00 via INTRAVENOUS

## 2016-12-18 MED ORDER — HYDROCODONE-ACETAMINOPHEN 5-325 MG PO TABS: 1.0000 | ORAL_TABLET | Freq: Four times a day (QID) | ORAL | 0 refills | Status: DC | PRN

## 2016-12-18 MED ORDER — ACETAMINOPHEN 325 MG PO TABS: 650.0000 mg | ORAL_TABLET | Freq: Four times a day (QID) | ORAL | Status: DC | PRN

## 2016-12-18 NOTE — Clinical Social Work Placement (Signed)
   CLINICAL SOCIAL WORK PLACEMENT  NOTE  Date:  12/18/2016  Patient Details  Name: Patrick Walker MRN: [id number] Date of Birth: 25-Feb-1925  Clinical Social Work is seeking post-discharge placement for this patient at the [place] level of care (*CSW will initial, date and re-position this form in  chart as items are completed):  Yes   Patient/family provided with [place] Work Department's list of facilities offering this level of care within the geographic area requested by the patient (or if unable, by the patient's family).  Yes   Patient/family informed of their freedom to choose among providers that offer the needed level of care, that participate in Medicare, Medicaid or managed care program needed by the patient, have an available bed and are willing to accept the patient.  Yes   Patient/family informed of [place]'s ownership interest in [facility] and [facility], as well as of the fact that they are under no obligation to receive care at these facilities.  PASRR submitted to EDS on 12/17/16     PASRR number received on 12/17/16     Existing PASRR number confirmed on       FL2 transmitted to all facilities in geographic area requested by pt/family on 12/17/16     FL2 transmitted to all facilities within larger geographic area on 12/17/16     Patient informed that his/her managed care company has contracts with or will negotiate with certain facilities, including the following:         Yes   Patient/family informed of bed offers received.  Patient chooses bed at   [facility]    Physician recommends and patient chooses bed at      Patient to be transferred to [facility]   on  12/18/16.  Patient to be transferred to facility by  [facility]     Patient family notified on   12/18/16 of transfer.  Name of family member notified:    family member Elta Guadeloupe at bedside.  PHYSICIAN Please sign FL2, Please sign DNR,  Please prepare prescriptions, Please prepare priority discharge summary, including medications     Additional Comment:    _______________________________________________ Normajean Baxter, LCSW 12/18/2016, 1:17 PM

## 2016-12-18 NOTE — Progress Notes (Signed)
   Assessment: 3 Days Post-Op  S/P Procedure(s) (LRB): INTRAMEDULLARY (IM) NAIL INTERTROCHANTRIC (Right) by Dr. Ernesta Amble. Murphy on 12/15/16  Active Problems:   Atrial flutter ([place])   Hearing loss   Aortic valve sclerosis   Aortic insufficiency   Intertrochanteric fracture of right femur [facility])  Doing well from an orthopedic perspective.  Pain controlled.  Now ambulating short distances w/ PT.  Plan: Please place 2 new Dressings today. Up with therapy Limit narcotics if able to limit impact on confusion.   Incentive Spirometry Apply ice Continue plan per medicine / cardiology  Weight Bearing: Weight Bearing as Tolerated (WBAT)  Dressings: Dry dressing PRN.  VTE prophylaxis: Eliquis, SCDs, ambulation Dispo: Per primary.  Skilled Nursing Facility/Rehab.  Previously active and ambulatory without assistive devices.  Subjective: Patient reports pain as moderate. Pain controlled with PO meds.  Taking in some PO - appetite not great. OOB walking some w/ PT.  Objective:   VITALS:   Vitals:   12/17/16 1615 12/17/16 1800 12/17/16 2106 12/18/16 0312  BP: (!) 128/93 (!) 141/76 138/77 121/61  Pulse: 71 (!) 52 78 79  Resp: 20 20 16 16   Temp:  97.8 F (36.6 C) 98.2 F (36.8 C) 97.4 F (36.3 C)  TempSrc:  Oral Oral Oral  SpO2: 95% (!) 88% 100% 95%  Weight:      Height:       CBC Latest Ref Rng & Units 12/17/2016 12/16/2016 12/15/2016  WBC 4.0 - 10.5 K/uL 7.1 6.9 6.7  Hemoglobin 13.0 - 17.0 g/dL 11.1(L) 11.0(L) 12.6(L)  Hematocrit 39.0 - 52.0 % 33.7(L) 33.0(L) 38.0(L)  Platelets 150 - 400 K/uL 127(L) 142(L) 156   BMP Latest Ref Rng & Units 12/18/2016 12/17/2016 12/16/2016  Glucose 65 - 99 mg/dL 110(H) 108(H) 141(H)  BUN 6 - 20 mg/dL 30(H) 31(H) 24(H)  Creatinine 0.61 - 1.24 mg/dL 1.55(H) 1.76(H) 1.59(H)  Sodium 135 - 145 mmol/L 138 136 136  Potassium 3.5 - 5.1 mmol/L 4.3 5.1 5.3(H)  Chloride 101 - 111 mmol/L 104 104 105  CO2 22 - 32 mmol/L 22 24 23   Calcium 8.9 - 10.3 mg/dL 8.9  8.5(L) 8.3(L)   Intake/Output      04/19 0701 - 04/20 0700 04/20 0701 - 04/21 0700   P.O. 240    I.V. (mL/kg) 951.7 (21.6)    Total Intake(mL/kg) 1191.7 (27.1)    Urine (mL/kg/hr) 225 (0.2)    Total Output 225     Net +966.7          Urine Occurrence 1 x      Physical Exam: General: NAD.  Supine in bed.  Calm, interactive.   MSK Neurovascularly intact Sensation intact distally Feet warm Dorsiflexion/Plantar flexion intact Incision: distal dressing removed overnight - soiled dt incontinence.    Prudencio Burly III, PA-C 12/18/2016, 7:37 AM

## 2016-12-18 NOTE — Progress Notes (Signed)
Called report to nurse Elmyra Ricks at [facility]. Reviewed HPI, PMH, recent vitals and test results, and informed her that the patient's last bowel movement was on 4/17 or prior. Discharge instructions printed and reviewed with patient and son, and copy given for them to take home. All questions addressed at this time. New prescriptions given to [facility] for them to give to nurse upon arrival to [place]. IV removed, and bandages changed. Room searched for patient belongings, and confirmed with patient that all valuables were accounted for prior to [facility] transporting patient to SNF via stretcher.

## 2016-12-18 NOTE — Progress Notes (Signed)
  Date: 12/18/2016  Patient name: Patrick Walker  Medical record number: [id number]  Date of birth: 02/26/1925   I have seen and evaluated this patient and I have discussed the plan of care with the house staff. Please see their note for complete details. I concur with their findings with the following additions/corrections: Mr. Newsham was seen on morning rounds. He was complaining of right hip pain. He denied chest pain or dyspnea. His lungs are clear to auscultation bilaterally. He has no lower extremity edema. His creatinine has decreased from 1.76 - 1.55 with IV fluids which are being continued as he shows no sign of volume overload. He continues to work with physical therapy and I anticipate discharge shortly.     Bartholomew Crews, MD 12/18/2016, 12:32 PM

## 2016-12-18 NOTE — Progress Notes (Addendum)
   Subjective: Patient was evaluated this morning on rounds.  He expressed frustration that he was having difficulty walking.  He reports pain with right lower extremity movement.  He used his spirometry device while we were in the room and denied difficulty breathing or pleuritic chest pain.  Objective:  Vital signs in last 24 hours: Vitals:   12/17/16 1615 12/17/16 1800 12/17/16 2106 12/18/16 0312  BP: (!) 128/93 (!) 141/76 138/77 121/61  Pulse: 71 (!) 52 78 79  Resp: 20 20 16 16   Temp:  97.8 F (36.6 C) 98.2 F (36.8 C) 97.4 F (36.3 C)  TempSrc:  Oral Oral Oral  SpO2: 95% (!) 88% 100% 95%  Weight:      Height:       Physical Exam  Constitutional: He is well-developed, well-nourished, and in no distress.  Hearing impairment   Cardiovascular:  Irregular rhythm, non-tachycardic, 3/6 systolic ejection murmur  Pulmonary/Chest: Effort normal and breath sounds normal. No respiratory distress. He has no wheezes. He has no rales.  Abdominal: Soft. Bowel sounds are normal. He exhibits no distension and no mass. There is no tenderness. There is no rebound and no guarding.  Musculoskeletal: He exhibits no edema.  Neurological: He is alert.  Skin: Skin is warm and dry.  Patient reports pain with any range of motion of right lower extremity. Right incision site with no signs of infection or ecchymosis      Assessment/Plan:  Active Problems:   Atrial flutter ([facility])   Hearing loss   Aortic valve sclerosis   Aortic insufficiency   Intertrochanteric fracture of right femur ([facility])  Intertrochanteric right femur fracture s/p intramedullary nail placed Patient had surgery 4/17. Today is day 3 postop and patient continues to report mild pain to the right lower extremity.  Family has decided on SNF placement.   - weight bearing as tolerated - incentive spirometry  - PT - Hydrocodone-acetaminophen QH6PRN - dressing change today  [place] Patient has needed redirection often while  inpatient.  Patient continues to be afebrile without a leukocytosis.  UA yesterday showed no signs of infection.  Overnight no reports of patient being disoriented.  He appeared comfortable and pleasant on exam. - Continue to monitor - limit use of opioid medication  Acute on Chronic Kidney Injury Today creatinine has decreased with IV NS and is currently 1.5 from 1.7.  Suspect patient is still volume depleted and would benefit from gentle hydration.  He could not tell me if he has been able to urinate.  Bladder scan showed 0cc post void residual.  Nurse reported patient had just urinated prior to the scan. -IV NS  100cc/hr   Left pleural effusion Yesterday's chest x-ray showed a stable left pleural effusion.  Monitor outpatient with repeat chest-xray  Atrial flutter Rates currently controlled on exam in the 97s.  - Appreciate Cardiology's recommendations - oral diltiazem - eliquis 2.5mg  BID  Aortic Stenosis Last echo on 07/2015 showed LVEF of 60% to 65%. It was noted there was mildly calcified aortic valve with moderate stenosis. Patient has an active lifestyle. He denies episodes or syncope, shortness of breath or chest pain. Will plan to get a repeat Echo as outpatient. - Echo as outpatient - Follow up with Dr. Percival Spanish outpatient  Dispo: Anticipated discharge pending SNF placement.   Valinda Party, DO 12/18/2016, 9:56 AM Pager: [phone]

## 2016-12-18 NOTE — Discharge Instructions (Signed)

## 2016-12-18 NOTE — Social Work (Addendum)
Clinical Social Worker facilitated patient discharge including contacting patient family and facility to confirm patient discharge plans.  Clinical information faxed to facility and family agreeable with plan.  CSW arranged ambulance transport via [facility] to [facility].  RN to call [phone] report prior to discharge. Pt going to Rm [facility].  Clinical Social Worker will sign off for now as social work intervention is no longer needed. Please consult [place] again if new need arises.  Elissa Hefty, LCSW Clinical Social Worker [phone]

## 2016-12-19 LAB — BPAM RBC
BLOOD PRODUCT EXPIRATION DATE: 201805032359
Blood Product Expiration Date: 201805042359
ISSUE DATE / TIME: 201804171752
ISSUE DATE / TIME: 201804171752
UNIT TYPE AND RH: 6200
Unit Type and Rh: 6200

## 2016-12-19 LAB — TYPE AND SCREEN
ABO/RH(D): A POS
ANTIBODY SCREEN: NEGATIVE
Unit division: 0
Unit division: 0

## 2017-03-24 ENCOUNTER — Other Ambulatory Visit: Payer: Self-pay | Admitting: Cardiology

## 2017-09-22 ENCOUNTER — Other Ambulatory Visit: Payer: Self-pay

## 2017-09-22 ENCOUNTER — Emergency Department (HOSPITAL_COMMUNITY): Payer: Medicare Other

## 2017-09-22 ENCOUNTER — Observation Stay (HOSPITAL_COMMUNITY)
Admission: EM | Admit: 2017-09-22 | Discharge: 2017-09-23 | Disposition: A | Discharge status: Home / Self Care | Payer: Medicare Other | Attending: Internal Medicine | Admitting: Internal Medicine

## 2017-09-22 ENCOUNTER — Encounter (HOSPITAL_COMMUNITY): Payer: Self-pay | Admitting: Emergency Medicine

## 2017-09-22 DIAGNOSIS — K589 Irritable bowel syndrome without diarrhea: Secondary | ICD-10-CM | POA: Insufficient documentation

## 2017-09-22 DIAGNOSIS — Z7901 Long term (current) use of anticoagulants: Secondary | ICD-10-CM | POA: Diagnosis not present

## 2017-09-22 DIAGNOSIS — Y9389 Activity, other specified: Secondary | ICD-10-CM | POA: Diagnosis not present

## 2017-09-22 DIAGNOSIS — G3189 Other specified degenerative diseases of nervous system: Secondary | ICD-10-CM | POA: Insufficient documentation

## 2017-09-22 DIAGNOSIS — G9389 Other specified disorders of brain: Secondary | ICD-10-CM | POA: Diagnosis not present

## 2017-09-22 DIAGNOSIS — N183 Chronic kidney disease, stage 3 (moderate): Secondary | ICD-10-CM | POA: Insufficient documentation

## 2017-09-22 DIAGNOSIS — F0390 Unspecified dementia without behavioral disturbance: Secondary | ICD-10-CM | POA: Insufficient documentation

## 2017-09-22 DIAGNOSIS — S020XXB Fracture of vault of skull, initial encounter for open fracture: Secondary | ICD-10-CM | POA: Diagnosis present

## 2017-09-22 DIAGNOSIS — Z79899 Other long term (current) drug therapy: Secondary | ICD-10-CM | POA: Insufficient documentation

## 2017-09-22 DIAGNOSIS — Z66 Do not resuscitate: Secondary | ICD-10-CM | POA: Diagnosis not present

## 2017-09-22 DIAGNOSIS — I358 Other nonrheumatic aortic valve disorders: Secondary | ICD-10-CM | POA: Insufficient documentation

## 2017-09-22 DIAGNOSIS — W19XXXA Unspecified fall, initial encounter: Secondary | ICD-10-CM

## 2017-09-22 DIAGNOSIS — W1789XA Other fall from one level to another, initial encounter: Secondary | ICD-10-CM | POA: Diagnosis not present

## 2017-09-22 DIAGNOSIS — Y998 Other external cause status: Secondary | ICD-10-CM | POA: Diagnosis not present

## 2017-09-22 DIAGNOSIS — I739 Peripheral vascular disease, unspecified: Secondary | ICD-10-CM | POA: Diagnosis not present

## 2017-09-22 DIAGNOSIS — M1990 Unspecified osteoarthritis, unspecified site: Secondary | ICD-10-CM | POA: Insufficient documentation

## 2017-09-22 DIAGNOSIS — I129 Hypertensive chronic kidney disease with stage 1 through stage 4 chronic kidney disease, or unspecified chronic kidney disease: Secondary | ICD-10-CM | POA: Diagnosis not present

## 2017-09-22 DIAGNOSIS — S0291XA Unspecified fracture of skull, initial encounter for closed fracture: Secondary | ICD-10-CM | POA: Diagnosis present

## 2017-09-22 DIAGNOSIS — Y9271 Barn as the place of occurrence of the external cause: Secondary | ICD-10-CM | POA: Diagnosis not present

## 2017-09-22 DIAGNOSIS — I351 Nonrheumatic aortic (valve) insufficiency: Secondary | ICD-10-CM | POA: Diagnosis not present

## 2017-09-22 DIAGNOSIS — I6782 Cerebral ischemia: Secondary | ICD-10-CM | POA: Insufficient documentation

## 2017-09-22 DIAGNOSIS — Y92009 Unspecified place in unspecified non-institutional (private) residence as the place of occurrence of the external cause: Secondary | ICD-10-CM | POA: Diagnosis not present

## 2017-09-22 DIAGNOSIS — M47812 Spondylosis without myelopathy or radiculopathy, cervical region: Secondary | ICD-10-CM | POA: Diagnosis not present

## 2017-09-22 DIAGNOSIS — I4892 Unspecified atrial flutter: Secondary | ICD-10-CM | POA: Insufficient documentation

## 2017-09-22 HISTORY — DX: Unspecified fall, initial encounter: W19.XXXA

## 2017-09-22 HISTORY — DX: Chronic kidney disease, stage 3 (moderate): N18.3

## 2017-09-22 HISTORY — DX: Chronic kidney disease, stage 3 unspecified: N18.30

## 2017-09-22 HISTORY — DX: Unspecified atrial fibrillation: I48.91

## 2017-09-22 LAB — CBC WITH DIFFERENTIAL/PLATELET
Basophils Absolute: 0 10*3/uL (ref 0.0–0.1)
Basophils Relative: 0 %
EOS ABS: 0.1 10*3/uL (ref 0.0–0.7)
Eosinophils Relative: 1 %
HEMATOCRIT: 36.7 % — AB (ref 39.0–52.0)
HEMOGLOBIN: 12.3 g/dL — AB (ref 13.0–17.0)
LYMPHS ABS: 0.8 10*3/uL (ref 0.7–4.0)
Lymphocytes Relative: 9 %
MCH: 33.1 pg (ref 26.0–34.0)
MCHC: 33.5 g/dL (ref 30.0–36.0)
MCV: 98.7 fL (ref 78.0–100.0)
Monocytes Absolute: 1.2 10*3/uL — ABNORMAL HIGH (ref 0.1–1.0)
Monocytes Relative: 13 %
NEUTROS ABS: 6.6 10*3/uL (ref 1.7–7.7)
NEUTROS PCT: 77 %
Platelets: 155 10*3/uL (ref 150–400)
RBC: 3.72 MIL/uL — AB (ref 4.22–5.81)
RDW: 12.9 % (ref 11.5–15.5)
WBC: 8.6 10*3/uL (ref 4.0–10.5)

## 2017-09-22 LAB — COMPREHENSIVE METABOLIC PANEL
ALT: 17 U/L (ref 17–63)
ANION GAP: 10 (ref 5–15)
AST: 27 U/L (ref 15–41)
Albumin: 3.6 g/dL (ref 3.5–5.0)
Alkaline Phosphatase: 68 U/L (ref 38–126)
BUN: 34 mg/dL — ABNORMAL HIGH (ref 6–20)
CHLORIDE: 106 mmol/L (ref 101–111)
CO2: 25 mmol/L (ref 22–32)
CREATININE: 1.66 mg/dL — AB (ref 0.61–1.24)
Calcium: 9.2 mg/dL (ref 8.9–10.3)
GFR, EST AFRICAN AMERICAN: 40 mL/min — AB (ref >60)
GFR, EST NON AFRICAN AMERICAN: 35 mL/min — AB (ref >60)
Glucose, Bld: 94 mg/dL (ref 65–99)
POTASSIUM: 4.4 mmol/L (ref 3.5–5.1)
Sodium: 141 mmol/L (ref 135–145)
Total Bilirubin: 0.7 mg/dL (ref 0.3–1.2)
Total Protein: 7.4 g/dL (ref 6.5–8.1)

## 2017-09-22 LAB — PROTIME-INR
INR: 1.03
PROTHROMBIN TIME: 13.4 s (ref 11.4–15.2)

## 2017-09-22 MED ORDER — VANCOMYCIN HCL 500 MG IV SOLR
500.0000 mg | INTRAVENOUS | Status: DC
  Filled 2017-09-22: qty 500

## 2017-09-22 MED ORDER — TETANUS-DIPHTH-ACELL PERTUSSIS 5-2.5-18.5 LF-MCG/0.5 IM SUSP
0.5000 mL | Freq: Once | INTRAMUSCULAR | Status: AC
  Administered 2017-09-22: 0.5 mL via INTRAMUSCULAR
  Filled 2017-09-22: qty 0.5

## 2017-09-22 MED ORDER — LIDOCAINE HCL 2 % IJ SOLN
10.0000 mL | Freq: Once | INTRAMUSCULAR | Status: AC
  Administered 2017-09-22: 200 mg
  Filled 2017-09-22: qty 20

## 2017-09-22 MED ORDER — METRONIDAZOLE IN NACL 5-0.79 MG/ML-% IV SOLN
500.0000 mg | Freq: Three times a day (TID) | INTRAVENOUS | Status: DC
  Administered 2017-09-23 (×2): 500 mg via INTRAVENOUS
  Filled 2017-09-22 (×3): qty 100

## 2017-09-22 MED ORDER — TAMSULOSIN HCL 0.4 MG PO CAPS
0.4000 mg | ORAL_CAPSULE | Freq: Every day | ORAL | Status: DC
  Administered 2017-09-23: 0.4 mg via ORAL
  Filled 2017-09-22: qty 1

## 2017-09-22 MED ORDER — ENSURE ENLIVE PO LIQD
237.0000 mL | Freq: Three times a day (TID) | ORAL | Status: DC
  Administered 2017-09-23 (×2): 237 mL via ORAL

## 2017-09-22 MED ORDER — ONDANSETRON HCL 4 MG/2ML IJ SOLN: 4.0000 mg | Freq: Four times a day (QID) | INTRAMUSCULAR | Status: DC | PRN

## 2017-09-22 MED ORDER — VANCOMYCIN HCL IN DEXTROSE 1-5 GM/200ML-% IV SOLN
1000.0000 mg | Freq: Once | INTRAVENOUS | Status: AC
  Administered 2017-09-22: 1000 mg via INTRAVENOUS
  Filled 2017-09-22: qty 200

## 2017-09-22 MED ORDER — METRONIDAZOLE IN NACL 5-0.79 MG/ML-% IV SOLN
500.0000 mg | Freq: Once | INTRAVENOUS | Status: AC
  Administered 2017-09-22: 500 mg via INTRAVENOUS
  Filled 2017-09-22: qty 100

## 2017-09-22 MED ORDER — VITAMIN B-12 100 MCG PO TABS
100.0000 ug | ORAL_TABLET | Freq: Every day | ORAL | Status: DC
  Administered 2017-09-23: 100 ug via ORAL
  Filled 2017-09-22: qty 1

## 2017-09-22 MED ORDER — PRENATAL PLUS 27-1 MG PO TABS
1.0000 | ORAL_TABLET | Freq: Every day | ORAL | Status: DC
  Administered 2017-09-23: 1 via ORAL
  Filled 2017-09-22: qty 1

## 2017-09-22 MED ORDER — SENNA 8.6 MG PO TABS
1.0000 | ORAL_TABLET | Freq: Two times a day (BID) | ORAL | Status: DC
  Administered 2017-09-23 (×2): 8.6 mg via ORAL
  Filled 2017-09-22 (×2): qty 1

## 2017-09-22 MED ORDER — ACETAMINOPHEN 325 MG PO TABS
650.0000 mg | ORAL_TABLET | Freq: Four times a day (QID) | ORAL | Status: DC | PRN
  Administered 2017-09-23: 650 mg via ORAL
  Filled 2017-09-22: qty 2

## 2017-09-22 MED ORDER — CEFAZOLIN SODIUM-DEXTROSE 2-4 GM/100ML-% IV SOLN
2.0000 g | Freq: Once | INTRAVENOUS | Status: AC
  Administered 2017-09-22: 2 g via INTRAVENOUS
  Filled 2017-09-22: qty 100

## 2017-09-22 MED ORDER — LATANOPROST 0.005 % OP SOLN
1.0000 [drp] | Freq: Every day | OPHTHALMIC | Status: DC
  Administered 2017-09-23: 1 [drp] via OPHTHALMIC
  Filled 2017-09-22: qty 2.5

## 2017-09-22 MED ORDER — ACETAMINOPHEN 650 MG RE SUPP: 650.0000 mg | Freq: Four times a day (QID) | RECTAL | Status: DC | PRN

## 2017-09-22 MED ORDER — ONDANSETRON HCL 4 MG PO TABS: 4.0000 mg | ORAL_TABLET | Freq: Four times a day (QID) | ORAL | Status: DC | PRN

## 2017-09-22 MED ORDER — POLYETHYLENE GLYCOL 3350 17 G PO PACK
17.0000 g | PACK | Freq: Every day | ORAL | Status: DC
  Administered 2017-09-23: 17 g via ORAL
  Filled 2017-09-22: qty 1

## 2017-09-22 MED ORDER — SODIUM CHLORIDE 0.9 % IV SOLN
INTRAVENOUS | Status: DC
  Administered 2017-09-23: 01:00:00 via INTRAVENOUS

## 2017-09-22 MED ORDER — GENTAMICIN IN SALINE 1.6-0.9 MG/ML-% IV SOLN
80.0000 mg | Freq: Once | INTRAVENOUS | Status: AC
  Administered 2017-09-22: 80 mg via INTRAVENOUS
  Filled 2017-09-22: qty 50

## 2017-09-22 NOTE — H&P (Addendum)
History and Physical    Patrick Walker [id number]:[id number] DOB: 1924/09/15 DOA: 09/22/2017  PCP: Leonard Downing, MD  Patient coming from: Home   I have personally briefly reviewed patient's old medical records in [place]  Chief Complaint: fall.   HPI: Patrick Walker is a 84 y.o. male with medical history significant of A flutter on eliquis, aortic insufficiency, CKD stage III, cr 1.5, who presents after a mechanical fall. Patient report everything happen so fast, he fell down and hit his head. He denies dizziness prior to episode. He denies chest pain, vision change, weakness. He is pleasantly confuse and his has history of dementia.    ED Course: Patient found to have Acute right frontal skull fracture involving the roof of the frontal sinus resulting in extra-axial air and pneumocephalus. No significant hemorrhage, edema or shift. Hb at 12, Cr 1.6.  He was evaluated by neurosurgery, recommend IV antibiotics, admission overnight and repeat CT head tomorrow.   Review of Systems: As per HPI otherwise 10 point review of systems negative.    Past Medical History:  Diagnosis Date  . Aortic insufficiency    Mild, echo, November, 2011  . Aortic valve sclerosis    Echo, November, 2011  . Arthritis   . Atrial flutter [facility])    New diagnosis November, 2012  . Difficulty in urination   . Ejection fraction    EF 55-60%, echo, November, 2012 ( rapid atrial flutter at that time)  . Glaucoma   . Hearing loss   . Irritable bowel disease   . Sinus bradycardia     Past Surgical History:  Procedure Laterality Date  . BACK SURGERY  2008-2009  . HAND SURGERY  1984-85  . HERNIA REPAIR  03/02/2011  . INTRAMEDULLARY (IM) NAIL INTERTROCHANTERIC Right 12/15/2016   Procedure: INTRAMEDULLARY (IM) NAIL INTERTROCHANTRIC;  Surgeon: Renette Butters, MD;  Location: [place];  Service: Orthopedics;  Laterality: Right;  . LEG SURGERY  1972     reports that  has never smoked. he has never used smokeless  tobacco. He reports that he does not drink alcohol or use drugs.  No Known Allergies  Family history ; unable to obtain from the patient   Prior to Admission medications   Medication Sig Start Date End Date Taking? Authorizing Provider  apixaban (ELIQUIS) 2.5 MG TABS tablet Take 2.5 mg by mouth 2 (two) times daily.   Yes [provider]  CARTIA XT 240 MG 24 hr capsule TAKE 1 CAPSULE BY MOUTH ONCE DAILY. MUST HAVE OFFICE VISIT BEFORE FURTHER REFILLS. 03/24/17  Yes [place], Peter M, MD  feeding supplement, ENSURE ENLIVE, (ENSURE ENLIVE) LIQD Take 237 mLs by mouth 3 (three) times daily between meals. Patient taking differently: Take 237 mLs by mouth as needed (nutrition).  02/25/16  Yes Lavina Hamman, MD  latanoprost (XALATAN) 0.005 % ophthalmic solution Place 1 drop into both eyes at bedtime.   Yes [provider]  loperamide (IMODIUM A-D) 2 MG tablet Take 2 mg by mouth 4 (four) times daily as needed for diarrhea or loose stools.   Yes [provider]  Prenatal Vit-Fe Fumarate-FA (PRENATAL VITAMIN PO) Take 1 tablet by mouth daily.   Yes [provider]  tamsulosin (FLOMAX) 0.4 MG CAPS capsule Take 0.4 mg by mouth at bedtime.   Yes [provider]  vitamin B-12 (CYANOCOBALAMIN) 100 MCG tablet Take 100 mcg by mouth daily.   Yes [provider]  HYDROcodone-acetaminophen (NORCO/VICODIN) 5-325 MG  tablet Take 1 tablet by mouth every 6 (six) hours as needed for severe pain. Patient not taking: Reported on 09/22/2017 12/18/16   Kalman Shan Ratliff, DO  polyethylene glycol [facility]) packet Take 17 g by mouth daily. Patient not taking: Reported on 03/27/2016 02/25/16   Lavina Hamman, MD    Physical Exam: Vitals:   09/22/17 2130 09/22/17 2215 09/22/17 2245 09/22/17 2300  BP: (!) 144/64 (!) 148/62 139/76 (!) 143/92  Pulse: 92 84 86 88  Resp:      Temp:      TempSrc:      SpO2: 95% 97% 97% 97%  Weight:      Height:        Constitutional:  NAD, calm, comfortable, head laceration, S/P suture  Vitals:   09/22/17 2130 09/22/17 2215 09/22/17 2245 09/22/17 2300  BP: (!) 144/64 (!) 148/62 139/76 (!) 143/92  Pulse: 92 84 86 88  Resp:      Temp:      TempSrc:      SpO2: 95% 97% 97% 97%  Weight:      Height:       Eyes: PERRL, lids and conjunctivae normal ENMT: Mucous membranes are moist. Posterior pharynx clear of any exudate or lesions.Normal dentition.  Neck: normal, supple, no masses, no thyromegaly Respiratory: clear to auscultation bilaterally, no wheezing, no crackles. Normal respiratory effort. No accessory muscle use.  Cardiovascular: Regular rate and rhythm, systolic  murmurs / rubs / gallops. No extremity edema. 2+ pedal pulses. No carotid bruits.  Abdomen: no tenderness, no masses palpated. No hepatosplenomegaly. Bowel sounds positive.  Musculoskeletal: no clubbing / cyanosis. No joint deformity upper and lower extremities. Good ROM, no contractures. Normal muscle tone.  Skin: no rashes, lesions, ulcers. No induration Neurologic: CN 2-12 grossly intact. Sensation intact, DTR normal. Strength 5/5 in all 4. confuse    Labs on Admission: I have personally reviewed following labs and imaging studies  CBC: Recent Labs  Lab 09/22/17 2006  WBC 8.6  NEUTROABS 6.6  HGB 12.3*  HCT 36.7*  MCV 98.7  PLT 010   Basic Metabolic Panel: Recent Labs  Lab 09/22/17 2006  NA 141  K 4.4  CL 106  CO2 25  GLUCOSE 94  BUN 34*  CREATININE 1.66*  CALCIUM 9.2   GFR: Estimated Creatinine Clearance: 19 mL/min (A) (by C-G formula based on SCr of 1.66 mg/dL (H)). Liver Function Tests: Recent Labs  Lab 09/22/17 2006  AST 27  ALT 17  ALKPHOS 68  BILITOT 0.7  PROT 7.4  ALBUMIN 3.6   No results for input(s): LIPASE, AMYLASE in the last 168 hours. No results for input(s): AMMONIA in the last 168 hours. Coagulation Profile: Recent Labs  Lab 09/22/17 2006  INR 1.03   Cardiac Enzymes: No results for input(s):  CKTOTAL, CKMB, CKMBINDEX, TROPONINI in the last 168 hours. BNP (last 3 results) No results for input(s): PROBNP in the last 8760 hours. HbA1C: No results for input(s): HGBA1C in the last 72 hours. CBG: No results for input(s): GLUCAP in the last 168 hours. Lipid Profile: No results for input(s): CHOL, HDL, LDLCALC, TRIG, CHOLHDL, LDLDIRECT in the last 72 hours. Thyroid Function Tests: No results for input(s): TSH, T4TOTAL, FREET4, T3FREE, THYROIDAB in the last 72 hours. Anemia Panel: No results for input(s): VITAMINB12, FOLATE, FERRITIN, TIBC, IRON, RETICCTPCT in the last 72 hours. Urine analysis:    Component Value Date/Time   COLORURINE YELLOW 12/17/2016 [place] 12/17/2016 1156  LABSPEC 1.018 12/17/2016 1156   PHURINE 5.0 12/17/2016 1156   GLUCOSEU 50 (A) 12/17/2016 1156   HGBUR SMALL (A) 12/17/2016 1156   BILIRUBINUR NEGATIVE 12/17/2016 1156   KETONESUR NEGATIVE 12/17/2016 1156   PROTEINUR 100 (A) 12/17/2016 1156   NITRITE NEGATIVE 12/17/2016 1156   LEUKOCYTESUR NEGATIVE 12/17/2016 1156    Radiological Exams on Admission: Ct Head Wo Contrast  Result Date: 09/22/2017 CLINICAL DATA:  Patient fell outside today with laceration to the right side of forehead. No loss of consciousness. EXAM: CT HEAD WITHOUT CONTRAST CT CERVICAL SPINE WITHOUT CONTRAST TECHNIQUE: Multidetector CT imaging of the head and cervical spine was performed following the standard protocol without intravenous contrast. Multiplanar CT image reconstructions of the cervical spine were also generated. COMPARISON:  02/15/2008 head CT FINDINGS: CT HEAD FINDINGS Brain: Extra-axial air is noted overlying the right frontal lobe secondary to a comminuted fracture involving the right frontal skull and roof of the frontal sinus with approximately 4 mm of depression of the fracture fragments noted. Overlying soft tissue swelling is noted of the forehead. No laceration is seen. No appreciable extra-axial  hemorrhage. Chronic cerebral atrophy with moderate small vessel ischemic disease is noted. No acute intraparenchymal hemorrhage, large vascular territory infarct, intra-axial mass nor extra-axial fluid collections. Small right-sided basal ganglial lacunar infarcts are noted bilaterally. Vascular: No hyperdense vessels Skull: As above stated Sinuses/Orbits: Bilateral lens replacements. Intact orbits. No air-fluid levels within the paranasal sinuses or mastoids. Other: None CT CERVICAL SPINE FINDINGS Alignment: Slight reversal cervical lordosis secondary to degenerative disc disease, apex at C5-6. Intact atlantal dental interval with osteoarthritic joint space narrowing. Calcifications along the transverse ligament. Skull base and vertebrae: No skull base fracture. Soft tissues and spinal canal: No prevertebral fluid or swelling. No visible canal hematoma. Disc levels: Mild disc space narrowing C4-5 with moderate-to-marked at C5-6 and C6-7 with small posterior marginal osteophytes at C5-6 and C6-7. Minimal bilateral neural foraminal encroachment from osteophytes at C5-6 and C6-7. No jumped or perched facets. Upper chest: Negative. Other: None IMPRESSION: 1. Acute right frontal skull fracture involving the roof of the frontal sinus resulting in extra-axial air and pneumocephalus. No significant hemorrhage, edema or shift. These results were called by telephone at the time of interpretation on 09/22/2017 at 7:08 pm to Dr. Nanda Quinton , who verbally acknowledged these results. 2. Atrophy with moderate degree of small vessel ischemia. Chronic bilateral basal ganglial lacunar infarcts. 3. Cervical spondylosis without acute cervical spine fracture. Electronically Signed   By: Ashley Royalty M.D.   On: 09/22/2017 19:10   Ct Cervical Spine Wo Contrast  Result Date: 09/22/2017 CLINICAL DATA:  Patient fell outside today with laceration to the right side of forehead. No loss of consciousness. EXAM: CT HEAD WITHOUT CONTRAST CT  CERVICAL SPINE WITHOUT CONTRAST TECHNIQUE: Multidetector CT imaging of the head and cervical spine was performed following the standard protocol without intravenous contrast. Multiplanar CT image reconstructions of the cervical spine were also generated. COMPARISON:  02/15/2008 head CT FINDINGS: CT HEAD FINDINGS Brain: Extra-axial air is noted overlying the right frontal lobe secondary to a comminuted fracture involving the right frontal skull and roof of the frontal sinus with approximately 4 mm of depression of the fracture fragments noted. Overlying soft tissue swelling is noted of the forehead. No laceration is seen. No appreciable extra-axial hemorrhage. Chronic cerebral atrophy with moderate small vessel ischemic disease is noted. No acute intraparenchymal hemorrhage, large vascular territory infarct, intra-axial mass nor extra-axial fluid collections. Small right-sided basal ganglial  lacunar infarcts are noted bilaterally. Vascular: No hyperdense vessels Skull: As above stated Sinuses/Orbits: Bilateral lens replacements. Intact orbits. No air-fluid levels within the paranasal sinuses or mastoids. Other: None CT CERVICAL SPINE FINDINGS Alignment: Slight reversal cervical lordosis secondary to degenerative disc disease, apex at C5-6. Intact atlantal dental interval with osteoarthritic joint space narrowing. Calcifications along the transverse ligament. Skull base and vertebrae: No skull base fracture. Soft tissues and spinal canal: No prevertebral fluid or swelling. No visible canal hematoma. Disc levels: Mild disc space narrowing C4-5 with moderate-to-marked at C5-6 and C6-7 with small posterior marginal osteophytes at C5-6 and C6-7. Minimal bilateral neural foraminal encroachment from osteophytes at C5-6 and C6-7. No jumped or perched facets. Upper chest: Negative. Other: None IMPRESSION: 1. Acute right frontal skull fracture involving the roof of the frontal sinus resulting in extra-axial air and  pneumocephalus. No significant hemorrhage, edema or shift. These results were called by telephone at the time of interpretation on 09/22/2017 at 7:08 pm to Dr. Nanda Quinton , who verbally acknowledged these results. 2. Atrophy with moderate degree of small vessel ischemia. Chronic bilateral basal ganglial lacunar infarcts. 3. Cervical spondylosis without acute cervical spine fracture. Electronically Signed   By: Ashley Royalty M.D.   On: 09/22/2017 19:10    EKG: will order EKG  Assessment/Plan Active Problems:   Atrial flutter [facility])   Aortic valve sclerosis   Aortic insufficiency   Long term current use of anticoagulant   Skull fracture (HCC)   Pneumocephalus, traumatic   Fall at home, initial encounter   1-Skull fracture, pneumocephalus.  Evaluated by Neurosurgery who recommends IV antibiotics prophylaxis for overnight. Repeat CT head tomorrow.  Hold eliquis for one week.  Discharge on oral antibiotics.  Follow up with neurosurgery in 1 week.   2-A. flutter;  Hold eliquis due to recent head trauma. See recommendation  from neurosurgery/   3-HTN; resume cartia in am if renal function stable/.   4- CKD stage 3, mildly increase cr at 1.6. Repeat labs in am.   5-fall; PT evaluation.  Fall precaution.    DVT prophylaxis: SCD Code Status: DNR, discussed with son over phone/  Family Communication; sons over phone.  Disposition Plan: home in 24 hours.  Consults called: neurosurgery  Admission status; observation telemetry.    Elmarie Shiley MD [facility] Pager [phone]  If 7PM-7AM, please contact night-coverage [email or web address] Password [facility]  09/22/2017, 11:16 PM

## 2017-09-22 NOTE — Consult Note (Signed)
Reason for Consult: skull fx  Referring Physician:EDP  Patrick Walker is an 84 y.o. male.   HPI:  Pleasant 84 year old comes in today after he fell off of a "slope" in his barn. He states that he fell head first. Denies any LOC. Her is currently alert and able to answer questions appropriately. He is accompanied by his son who is from out of town. He denies any NV or vision changes. He reports a mild headache. He sustained a lac to his right forehead. He is on Eliquis but is unsure why.   Past Medical History:  Diagnosis Date  . Aortic insufficiency    Mild, echo, November, 2011  . Aortic valve sclerosis    Echo, November, 2011  . Arthritis   . Atrial flutter [facility])    New diagnosis November, 2012  . Difficulty in urination   . Ejection fraction    EF 55-60%, echo, November, 2012 ( rapid atrial flutter at that time)  . Glaucoma   . Hearing loss   . Irritable bowel disease   . Sinus bradycardia     Past Surgical History:  Procedure Laterality Date  . BACK SURGERY  2008-2009  . HAND SURGERY  1984-85  . HERNIA REPAIR  03/02/2011  . INTRAMEDULLARY (IM) NAIL INTERTROCHANTERIC Right 12/15/2016   Procedure: INTRAMEDULLARY (IM) NAIL INTERTROCHANTRIC;  Surgeon: Renette Butters, MD;  Location: [place];  Service: Orthopedics;  Laterality: Right;  . LEG SURGERY  1972    No Known Allergies  Social History   Tobacco Use  . Smoking status: Never Smoker  . Smokeless tobacco: Never Used  Substance Use Topics  . Alcohol use: No    No family history on file.   Review of Systems  Positive ROS: mild headache  All other systems have been reviewed and were otherwise negative with the exception of those mentioned in the HPI and as above.  Objective: Vital signs in last 24 hours: Temp:  [97.8 F (36.6 C)] 97.8 F (36.6 C) (01/23 1806) Pulse Rate:  [84-85] 85 (01/23 1830) Resp:  [16] 16 (01/23 1806) BP: (139-174)/(76-83) 174/83 (01/23 1830) SpO2:  [98 %-100 %] 100 % (01/23 1830) Weight:   [45.4 kg (100 lb)] 45.4 kg (100 lb) (01/23 1807)  General Appearance: Alert, cooperative, no distress, appears stated age Head: Normocephalic, without obvious abnormality, lac to right forehead Eyes: PERRL, conjunctiva/corneas clear, EOM's intact, fundi benign, both eyes      Throat: benign Neck: Supple, symmetrical, trachea midline Back: not tested Lungs: respirations unlabored Heart: Regular rate and rhythm Extremities: Extremities normal, atraumatic, no cyanosis or edema Pulses: 2+ and symmetric all extremities Skin: Skin color, texture, turgor normal, no rashes or lesions, lac to right forehead  NEUROLOGIC:   Mental status: A&O x4, no aphasia, good attention span, Memory and fund of knowledge Motor Exam - grossly normal, normal tone and bulk Sensory Exam - grossly normal Reflexes: symmetric, no pathologic reflexes, No Hoffman's, No clonus Coordination - grossly normal Gait - not tested Balance - not tested Cranial Nerves: I: smell Not tested  II: visual acuity  OS: na    OD: na  II: visual fields Full to confrontation  II: pupils Equal, round, reactive to light  III,VII: ptosis None  III,IV,VI: extraocular muscles  Full ROM  V: mastication Normal  V: facial light touch sensation  Normal  V,VII: corneal reflex  Present  VII: facial muscle function - upper  Normal  VII: facial muscle function - lower  Normal  VIII: hearing Not tested  IX: soft palate elevation  Normal  IX,X: gag reflex Present  XI: trapezius strength  5/5  XI: sternocleidomastoid strength 5/5  XI: neck flexion strength  5/5  XII: tongue strength  Normal    Data Review Lab Results  Component Value Date   WBC 8.6 09/22/2017   HGB 12.3 (L) 09/22/2017   HCT 36.7 (L) 09/22/2017   MCV 98.7 09/22/2017   PLT 155 09/22/2017   Lab Results  Component Value Date   NA 138 12/18/2016   K 4.3 12/18/2016   CL 104 12/18/2016   CO2 22 12/18/2016   BUN 30 (H) 12/18/2016   CREATININE 1.55 (H) 12/18/2016    GLUCOSE 110 (H) 12/18/2016   Lab Results  Component Value Date   INR 1.03 09/22/2017    Radiology: Ct Head Wo Contrast  Result Date: 09/22/2017 CLINICAL DATA:  Patient fell outside today with laceration to the right side of forehead. No loss of consciousness. EXAM: CT HEAD WITHOUT CONTRAST CT CERVICAL SPINE WITHOUT CONTRAST TECHNIQUE: Multidetector CT imaging of the head and cervical spine was performed following the standard protocol without intravenous contrast. Multiplanar CT image reconstructions of the cervical spine were also generated. COMPARISON:  02/15/2008 head CT FINDINGS: CT HEAD FINDINGS Brain: Extra-axial air is noted overlying the right frontal lobe secondary to a comminuted fracture involving the right frontal skull and roof of the frontal sinus with approximately 4 mm of depression of the fracture fragments noted. Overlying soft tissue swelling is noted of the forehead. No laceration is seen. No appreciable extra-axial hemorrhage. Chronic cerebral atrophy with moderate small vessel ischemic disease is noted. No acute intraparenchymal hemorrhage, large vascular territory infarct, intra-axial mass nor extra-axial fluid collections. Small right-sided basal ganglial lacunar infarcts are noted bilaterally. Vascular: No hyperdense vessels Skull: As above stated Sinuses/Orbits: Bilateral lens replacements. Intact orbits. No air-fluid levels within the paranasal sinuses or mastoids. Other: None CT CERVICAL SPINE FINDINGS Alignment: Slight reversal cervical lordosis secondary to degenerative disc disease, apex at C5-6. Intact atlantal dental interval with osteoarthritic joint space narrowing. Calcifications along the transverse ligament. Skull base and vertebrae: No skull base fracture. Soft tissues and spinal canal: No prevertebral fluid or swelling. No visible canal hematoma. Disc levels: Mild disc space narrowing C4-5 with moderate-to-marked at C5-6 and C6-7 with small posterior marginal  osteophytes at C5-6 and C6-7. Minimal bilateral neural foraminal encroachment from osteophytes at C5-6 and C6-7. No jumped or perched facets. Upper chest: Negative. Other: None IMPRESSION: 1. Acute right frontal skull fracture involving the roof of the frontal sinus resulting in extra-axial air and pneumocephalus. No significant hemorrhage, edema or shift. These results were called by telephone at the time of interpretation on 09/22/2017 at 7:08 pm to Dr. Nanda Quinton , who verbally acknowledged these results. 2. Atrophy with moderate degree of small vessel ischemia. Chronic bilateral basal ganglial lacunar infarcts. 3. Cervical spondylosis without acute cervical spine fracture. Electronically Signed   By: Ashley Royalty M.D.   On: 09/22/2017 19:10   Ct Cervical Spine Wo Contrast  Result Date: 09/22/2017 CLINICAL DATA:  Patient fell outside today with laceration to the right side of forehead. No loss of consciousness. EXAM: CT HEAD WITHOUT CONTRAST CT CERVICAL SPINE WITHOUT CONTRAST TECHNIQUE: Multidetector CT imaging of the head and cervical spine was performed following the standard protocol without intravenous contrast. Multiplanar CT image reconstructions of the cervical spine were also generated. COMPARISON:  02/15/2008 head CT FINDINGS: CT HEAD FINDINGS Brain: Extra-axial  air is noted overlying the right frontal lobe secondary to a comminuted fracture involving the right frontal skull and roof of the frontal sinus with approximately 4 mm of depression of the fracture fragments noted. Overlying soft tissue swelling is noted of the forehead. No laceration is seen. No appreciable extra-axial hemorrhage. Chronic cerebral atrophy with moderate small vessel ischemic disease is noted. No acute intraparenchymal hemorrhage, large vascular territory infarct, intra-axial mass nor extra-axial fluid collections. Small right-sided basal ganglial lacunar infarcts are noted bilaterally. Vascular: No hyperdense vessels Skull:  As above stated Sinuses/Orbits: Bilateral lens replacements. Intact orbits. No air-fluid levels within the paranasal sinuses or mastoids. Other: None CT CERVICAL SPINE FINDINGS Alignment: Slight reversal cervical lordosis secondary to degenerative disc disease, apex at C5-6. Intact atlantal dental interval with osteoarthritic joint space narrowing. Calcifications along the transverse ligament. Skull base and vertebrae: No skull base fracture. Soft tissues and spinal canal: No prevertebral fluid or swelling. No visible canal hematoma. Disc levels: Mild disc space narrowing C4-5 with moderate-to-marked at C5-6 and C6-7 with small posterior marginal osteophytes at C5-6 and C6-7. Minimal bilateral neural foraminal encroachment from osteophytes at C5-6 and C6-7. No jumped or perched facets. Upper chest: Negative. Other: None IMPRESSION: 1. Acute right frontal skull fracture involving the roof of the frontal sinus resulting in extra-axial air and pneumocephalus. No significant hemorrhage, edema or shift. These results were called by telephone at the time of interpretation on 09/22/2017 at 7:08 pm to Dr. Nanda Quinton , who verbally acknowledged these results. 2. Atrophy with moderate degree of small vessel ischemia. Chronic bilateral basal ganglial lacunar infarcts. 3. Cervical spondylosis without acute cervical spine fracture. Electronically Signed   By: Ashley Royalty M.D.   On: 09/22/2017 19:10     Assessment/Plan: 84 year old came to the hospital after he sustained a fall from a standing position. He is currently neuro intact with no deficits. He sustained a open depressed frontal skull fracture with some pneumocephalus. No hemorrhage noted on CT scan. I do not believe that he needs any surgical intervention at this time. He is best treated with IV abx while in the ED and ok to d/c home and follow up with [place] in 7-10 days. discontinue Eliquis for a week if no recent cardiac history. Family does have concern of him going  home tonight. If he is admitted overnight we would suggest him having another dose Vanc, rocephin, and flagyl and repeat head CT in AM.    Ocie Cornfield [facility] 09/22/2017 8:38 PM

## 2017-09-22 NOTE — ED Provider Notes (Signed)
Emergency Department Provider Note   I have reviewed the triage vital signs and the nursing notes.   HISTORY  Chief Complaint Fall   HPI Patrick Walker is a 84 y.o. male with PMH of aortic insufficiency, a-flutter on Eliquis since to the emergency department for evaluation after mechanical fall at home with head injury.  Patient states he was out walking in the yard behind the bar and when he slipped and fell striking his forehead on the ground.  No loss of consciousness.  He was unable to catch himself with his hands.  He denies any preceding chest pain, shortness of breath, palpitations.  Patient was ultimately able to get up under his own power and walk back into the house.  He called for help even bleeding from the forehead.  Since to the emergency department with family.  No radiation of symptoms or modifying factors.   Past Medical History:  Diagnosis Date  . Aortic insufficiency    Mild, echo, November, 2011  . Aortic valve sclerosis    Echo, November, 2011  . Arthritis   . Atrial flutter [facility])    New diagnosis November, 2012  . Difficulty in urination   . Ejection fraction    EF 55-60%, echo, November, 2012 ( rapid atrial flutter at that time)  . Glaucoma   . Hearing loss   . Irritable bowel disease   . Sinus bradycardia     Patient Active Problem List   Diagnosis Date Noted  . Skull fracture ([place]) 09/22/2017  . Intertrochanteric fracture of right femur ([place]) 12/15/2016  . Malnutrition of moderate degree 02/24/2016  . Acute kidney injury ([place]) 02/23/2016  . Diverticulitis 02/23/2016  . Dehydration 02/23/2016  . Constipation 02/23/2016  . Nausea 02/23/2016  . Generalized weakness 02/23/2016  . Sinus bradycardia   . Long term current use of anticoagulant 08/04/2011  . Aortic valve sclerosis   . Aortic insufficiency   . Atrial flutter ([place])   . TSH (thyroid-stimulating hormone deficiency)   . Hearing loss   . Shortness of breath   . Inguinal hernia - left  s/p lap [facility] repair [id number] 03/16/2011    Past Surgical History:  Procedure Laterality Date  . BACK SURGERY  2008-2009  . HAND SURGERY  1984-85  . HERNIA REPAIR  03/02/2011  . INTRAMEDULLARY (IM) NAIL INTERTROCHANTERIC Right 12/15/2016   Procedure: INTRAMEDULLARY (IM) NAIL INTERTROCHANTRIC;  Surgeon: Renette Butters, MD;  Location: [place];  Service: Orthopedics;  Laterality: Right;  . LEG SURGERY  1972    Current Outpatient Rx  . Order #: [id number] Class: Historical Med  . Order #: [id number] Class: Normal  . Order #: [id number] Class: Normal  . Order #: [id number] Class: Historical Med  . Order #: [id number] Class: Historical Med  . Order #: [id number] Class: Historical Med  . Order #: [id number] Class: Historical Med  . Order #: [id number] Class: Historical Med  . Order #: [id number] Class: Print  . Order #: [id number] Class: Normal    Allergies Patient has no known allergies.  No family history on file.  Social History Social History   Tobacco Use  . Smoking status: Never Smoker  . Smokeless tobacco: Never Used  Substance Use Topics  . Alcohol use: No  . Drug use: No    Review of Systems  Constitutional: No fever/chills Eyes: No visual changes. ENT: No sore throat. Cardiovascular: Denies chest pain. Respiratory: Denies shortness of breath. Gastrointestinal: No abdominal pain.  No nausea, no vomiting.  No diarrhea.  No constipation. Genitourinary: Negative  for dysuria. Musculoskeletal: Negative for back pain. Skin: Negative for rash. Positive head injury and bleeding from laceration.  Neurological: Negative for focal weakness or numbness. Positive HA.   10-point ROS otherwise negative.  ____________________________________________   PHYSICAL EXAM:  VITAL SIGNS: ED Triage Vitals  Enc Vitals Group     BP 09/22/17 1806 139/76     Pulse Rate 09/22/17 1806 84     Resp 09/22/17 1806 16     Temp 09/22/17 1806 97.8 F (36.6 C)     Temp Source 09/22/17 1806 Oral     SpO2  09/22/17 1806 98 %     Weight 09/22/17 1807 100 lb (45.4 kg)     Height 09/22/17 1807 5\' 5"  (1.651 m)     Pain Score 09/22/17 1806 5   Constitutional: Alert and oriented. Well appearing and in no acute distress. Eyes: Conjunctivae are normal. PERRL. EOMI. Head: 3 cm forehead laceration.  Nose: No congestion/rhinnorhea. Mouth/Throat: Mucous membranes are moist.  Neck: No stridor. No cervical spine tenderness to palpation. Cardiovascular: Normal rate, regular rhythm. Good peripheral circulation. Grossly normal heart sounds.   Respiratory: Normal respiratory effort.  No retractions. Lungs CTAB. Gastrointestinal: Soft and nontender. No distention.  Musculoskeletal: No lower extremity tenderness nor edema. No gross deformities of extremities. Neurologic:  Normal speech and language. No gross focal neurologic deficits are appreciated.  Skin:  Skin is warm, dry and intact. No rash noted. ____________________________________________  RADIOLOGY  Ct Head Wo Contrast  Result Date: 09/22/2017 CLINICAL DATA:  Patient fell outside today with laceration to the right side of forehead. No loss of consciousness. EXAM: CT HEAD WITHOUT CONTRAST CT CERVICAL SPINE WITHOUT CONTRAST TECHNIQUE: Multidetector CT imaging of the head and cervical spine was performed following the standard protocol without intravenous contrast. Multiplanar CT image reconstructions of the cervical spine were also generated. COMPARISON:  02/15/2008 head CT FINDINGS: CT HEAD FINDINGS Brain: Extra-axial air is noted overlying the right frontal lobe secondary to a comminuted fracture involving the right frontal skull and roof of the frontal sinus with approximately 4 mm of depression of the fracture fragments noted. Overlying soft tissue swelling is noted of the forehead. No laceration is seen. No appreciable extra-axial hemorrhage. Chronic cerebral atrophy with moderate small vessel ischemic disease is noted. No acute intraparenchymal  hemorrhage, large vascular territory infarct, intra-axial mass nor extra-axial fluid collections. Small right-sided basal ganglial lacunar infarcts are noted bilaterally. Vascular: No hyperdense vessels Skull: As above stated Sinuses/Orbits: Bilateral lens replacements. Intact orbits. No air-fluid levels within the paranasal sinuses or mastoids. Other: None CT CERVICAL SPINE FINDINGS Alignment: Slight reversal cervical lordosis secondary to degenerative disc disease, apex at C5-6. Intact atlantal dental interval with osteoarthritic joint space narrowing. Calcifications along the transverse ligament. Skull base and vertebrae: No skull base fracture. Soft tissues and spinal canal: No prevertebral fluid or swelling. No visible canal hematoma. Disc levels: Mild disc space narrowing C4-5 with moderate-to-marked at C5-6 and C6-7 with small posterior marginal osteophytes at C5-6 and C6-7. Minimal bilateral neural foraminal encroachment from osteophytes at C5-6 and C6-7. No jumped or perched facets. Upper chest: Negative. Other: None IMPRESSION: 1. Acute right frontal skull fracture involving the roof of the frontal sinus resulting in extra-axial air and pneumocephalus. No significant hemorrhage, edema or shift. These results were called by telephone at the time of interpretation on 09/22/2017 at 7:08 pm to Dr. Nanda Quinton , who verbally acknowledged these results. 2. Atrophy with moderate degree of small vessel ischemia. Chronic bilateral basal  ganglial lacunar infarcts. 3. Cervical spondylosis without acute cervical spine fracture. Electronically Signed   By: Ashley Royalty M.D.   On: 09/22/2017 19:10   Ct Cervical Spine Wo Contrast  Result Date: 09/22/2017 CLINICAL DATA:  Patient fell outside today with laceration to the right side of forehead. No loss of consciousness. EXAM: CT HEAD WITHOUT CONTRAST CT CERVICAL SPINE WITHOUT CONTRAST TECHNIQUE: Multidetector CT imaging of the head and cervical spine was performed  following the standard protocol without intravenous contrast. Multiplanar CT image reconstructions of the cervical spine were also generated. COMPARISON:  02/15/2008 head CT FINDINGS: CT HEAD FINDINGS Brain: Extra-axial air is noted overlying the right frontal lobe secondary to a comminuted fracture involving the right frontal skull and roof of the frontal sinus with approximately 4 mm of depression of the fracture fragments noted. Overlying soft tissue swelling is noted of the forehead. No laceration is seen. No appreciable extra-axial hemorrhage. Chronic cerebral atrophy with moderate small vessel ischemic disease is noted. No acute intraparenchymal hemorrhage, large vascular territory infarct, intra-axial mass nor extra-axial fluid collections. Small right-sided basal ganglial lacunar infarcts are noted bilaterally. Vascular: No hyperdense vessels Skull: As above stated Sinuses/Orbits: Bilateral lens replacements. Intact orbits. No air-fluid levels within the paranasal sinuses or mastoids. Other: None CT CERVICAL SPINE FINDINGS Alignment: Slight reversal cervical lordosis secondary to degenerative disc disease, apex at C5-6. Intact atlantal dental interval with osteoarthritic joint space narrowing. Calcifications along the transverse ligament. Skull base and vertebrae: No skull base fracture. Soft tissues and spinal canal: No prevertebral fluid or swelling. No visible canal hematoma. Disc levels: Mild disc space narrowing C4-5 with moderate-to-marked at C5-6 and C6-7 with small posterior marginal osteophytes at C5-6 and C6-7. Minimal bilateral neural foraminal encroachment from osteophytes at C5-6 and C6-7. No jumped or perched facets. Upper chest: Negative. Other: None IMPRESSION: 1. Acute right frontal skull fracture involving the roof of the frontal sinus resulting in extra-axial air and pneumocephalus. No significant hemorrhage, edema or shift. These results were called by telephone at the time of  interpretation on 09/22/2017 at 7:08 pm to Dr. Nanda Quinton , who verbally acknowledged these results. 2. Atrophy with moderate degree of small vessel ischemia. Chronic bilateral basal ganglial lacunar infarcts. 3. Cervical spondylosis without acute cervical spine fracture. Electronically Signed   By: Ashley Royalty M.D.   On: 09/22/2017 19:10    ____________________________________________   PROCEDURES  Procedure(s) performed:   .Critical Care Performed by: Margette Fast, MD Authorized by: Margette Fast, MD   Critical care provider statement:    Critical care time (minutes):  40   Critical care time was exclusive of:  Separately billable procedures and treating other patients and teaching time   Critical care was necessary to treat or prevent imminent or life-threatening deterioration of the following conditions:  Trauma   Critical care was time spent personally by me on the following activities:  Blood draw for specimens, development of treatment plan with patient or surrogate, discussions with consultants, evaluation of patient's response to treatment, examination of patient, ordering and performing treatments and interventions, ordering and review of laboratory studies, ordering and review of radiographic studies, pulse oximetry, re-evaluation of patient's condition and review of old charts   I assumed direction of critical care for this patient from another provider in my specialty: no   .Marland KitchenLaceration Repair Date/Time: 09/22/2017 11:15 PM Performed by: Margette Fast, MD Authorized by: Margette Fast, MD   Consent:    Consent obtained:  Verbal  Consent given by:  Patient   Risks discussed:  Infection, need for additional repair, nerve damage, poor wound healing, poor cosmetic result, pain, retained foreign body, tendon damage and vascular damage Anesthesia (see MAR for exact dosages):    Anesthesia method:  Local infiltration   Local anesthetic:  Lidocaine 1% WITH epi Laceration  details:    Location:  Face   Face location:  Forehead   Length (cm):  4   Depth (mm):  10 Repair type:    Repair type:  Intermediate Pre-procedure details:    Preparation:  Patient was prepped and draped in usual sterile fashion and imaging obtained to evaluate for foreign bodies Exploration:    Hemostasis achieved with:  Direct pressure   Wound exploration: entire depth of wound probed and visualized     Wound extent: underlying fracture and vascular damage     Wound extent: no foreign bodies/material noted, no nerve damage noted and no tendon damage noted     Contaminated: no   Treatment:    Area cleansed with:  Saline and Betadine   Amount of cleaning:  Extensive   Irrigation solution:  Sterile saline   Irrigation volume:  500   Visualized foreign bodies/material removed: no   Skin repair:    Repair method:  Sutures   Suture size:  4-0   Suture material:  Prolene   Suture technique:  Simple interrupted   Number of sutures:  7 Approximation:    Approximation:  Close   Vermilion border: well-aligned   Post-procedure details:    Dressing:  Open (no dressing)   Patient tolerance of procedure:  Tolerated well, no immediate complications     ____________________________________________   INITIAL IMPRESSION / ASSESSMENT AND PLAN / ED COURSE  Pertinent labs & imaging results that were available during my care of the patient were reviewed by me and considered in my medical decision making (see chart for details).  Patient presents to the emergency department for evaluation of mechanical fall at home.  He had head trauma with a 3 cm forehead laceration.  Patient is on Eliquis.  Plan for CT imaging of the head and neck with repair of laceration.  Patient cannot recall when his last tetanus shot was so will update here.  No report of near syncope or other concerning clinical features.  CT images discussed with Radiology. Will contact NSG.   08:00 PM Spoke with Dr. Saintclair Halsted with  Neurosurgery.  He has reviewed the CT scans and states to me that there is no need for neurosurgical intervention.  He advises washout and closure of the wound with IV antibiotics. Could consider observing overnight and have the patient get a repeat CT in the AM. Tetanus updated.   Discussed patient's case with Hospitalist to request admission. Patient and family (if present) updated with plan. Care transferred to Hospitalist service.  I reviewed all nursing notes, vitals, pertinent old records, EKGs, labs, imaging (as available).  10:45 PM Laceration repaired as above. On reassessment there is no change in mental status.  ____________________________________________  FINAL CLINICAL IMPRESSION(S) / ED DIAGNOSES  Final diagnoses:  Open fracture of frontal bone, initial encounter ([place])  Fall, initial encounter     MEDICATIONS GIVEN DURING THIS VISIT:  Medications  metroNIDAZOLE (FLAGYL) IVPB 500 mg (500 mg Intravenous New Bag/Given 09/22/17 2216)  vancomycin (VANCOCIN) IVPB 1000 mg/200 mL premix (1,000 mg Intravenous New Bag/Given 09/22/17 2216)  metroNIDAZOLE (FLAGYL) IVPB 500 mg (not administered)  Tdap (BOOSTRIX) injection 0.5 mL (  0.5 mLs Intramuscular Given 09/22/17 1856)  lidocaine (XYLOCAINE) 2 % (with pres) injection 200 mg (200 mg Infiltration Given 09/22/17 1856)  ceFAZolin (ANCEF) IVPB 2g/100 mL premix (0 g Intravenous Stopped 09/22/17 2137)  gentamicin (GARAMYCIN) IVPB 80 mg (80 mg Intravenous New Bag/Given 09/22/17 2216)    Note:  This document was prepared using Dragon voice recognition software and may include unintentional dictation errors.  Nanda Quinton, MD Emergency Medicine    Long, [facility], MD 09/22/17 [id number]

## 2017-09-22 NOTE — Progress Notes (Signed)
Pharmacy Antibiotic Note  Patrick Walker is a 84 y.o. male admitted on 09/22/2017 after falling off of his barn and sustaining a skull fracture. Neurosurgery evaluated the patient - he will not require surgical intervention. He is on Eliquis prior to admission for AFlutter. In the ED, he received one dose of Ancef, gentamicin, flagyl and vancomycin. Planning to continue vancomycin for prophylaxis.   Plan: -Vancomycin 500 mg IV q24h -Monitor renal fx, cultures, VT as needed   Height: 5\' 5"  (165.1 cm) Weight: 100 lb (45.4 kg) IBW/kg (Calculated) : 61.5  Temp (24hrs), Avg:97.8 F (36.6 C), Min:97.8 F (36.6 C), Max:97.8 F (36.6 C)  Recent Labs  Lab 09/22/17 2006  WBC 8.6  CREATININE 1.66*    Estimated Creatinine Clearance: 19 mL/min (A) (by C-G formula based on SCr of 1.66 mg/dL (H)).     Antimicrobials this admission: 1/23 vancomycin >  Dose adjustments this admission: N/A  Microbiology results: N/A  Harvel Quale 09/22/2017 11:17 PM

## 2017-09-22 NOTE — ED Triage Notes (Signed)
To ED with c/o fall today-- fell outside today, was able to get up, go inside, son found him at home. Pt is on elaquis,  Pt is HOH , slight dementia,  Pt has lac/avulsion to right upper forehead--

## 2017-09-22 NOTE — ED Notes (Signed)
On way to CT 

## 2017-09-23 ENCOUNTER — Observation Stay (HOSPITAL_COMMUNITY): Payer: Medicare Other

## 2017-09-23 ENCOUNTER — Other Ambulatory Visit: Payer: Self-pay

## 2017-09-23 ENCOUNTER — Encounter (HOSPITAL_COMMUNITY): Payer: Self-pay | Admitting: General Practice

## 2017-09-23 DIAGNOSIS — I4892 Unspecified atrial flutter: Secondary | ICD-10-CM | POA: Diagnosis not present

## 2017-09-23 DIAGNOSIS — G9389 Other specified disorders of brain: Secondary | ICD-10-CM

## 2017-09-23 DIAGNOSIS — Y92009 Unspecified place in unspecified non-institutional (private) residence as the place of occurrence of the external cause: Secondary | ICD-10-CM | POA: Diagnosis not present

## 2017-09-23 DIAGNOSIS — W19XXXA Unspecified fall, initial encounter: Secondary | ICD-10-CM | POA: Diagnosis not present

## 2017-09-23 DIAGNOSIS — S020XXB Fracture of vault of skull, initial encounter for open fracture: Secondary | ICD-10-CM | POA: Diagnosis not present

## 2017-09-23 LAB — CBC
HEMATOCRIT: 31.2 % — AB (ref 39.0–52.0)
Hemoglobin: 10.6 g/dL — ABNORMAL LOW (ref 13.0–17.0)
MCH: 33.5 pg (ref 26.0–34.0)
MCHC: 34 g/dL (ref 30.0–36.0)
MCV: 98.7 fL (ref 78.0–100.0)
Platelets: 133 10*3/uL — ABNORMAL LOW (ref 150–400)
RBC: 3.16 MIL/uL — ABNORMAL LOW (ref 4.22–5.81)
RDW: 13.1 % (ref 11.5–15.5)
WBC: 9.6 10*3/uL (ref 4.0–10.5)

## 2017-09-23 MED ORDER — DEXTROSE 5 % IV SOLN
1.0000 g | INTRAVENOUS | Status: DC
  Administered 2017-09-23: 1 g via INTRAVENOUS
  Filled 2017-09-23: qty 10

## 2017-09-23 MED ORDER — AMOXICILLIN-POT CLAVULANATE 875-125 MG PO TABS: 1.0000 | ORAL_TABLET | Freq: Two times a day (BID) | ORAL | 0 refills | Status: AC

## 2017-09-23 MED ORDER — DOXYCYCLINE HYCLATE 100 MG PO CAPS: 100.0000 mg | ORAL_CAPSULE | Freq: Two times a day (BID) | ORAL | 0 refills | Status: AC

## 2017-09-23 NOTE — Progress Notes (Signed)
OT Cancellation Note  Patient Details Name: Patrick Walker MRN: [id number] DOB: 1925-01-05   Cancelled Treatment:    Reason Eval/Treat Not Completed: Patient declined, no reason specified. Talked with Pt and 2 sons. They stated that they are preparing to have 24 hour care for their father that will include assist for ADL and IADL. They did not have any questions or concerns for OT, and declined the evaluation politely. OT reviewed basic safety (especially in the shower for fall prevention) and family was appreciative "My parents are very stubborn, they won't use a shower chair" OT will sign off at this point. Thank you for the opportunity to serve this patient and their family.  Merri Ray Susan Bleich 09/23/2017, 4:25 PM  Hulda Humphrey OTR/L [phone]

## 2017-09-23 NOTE — Progress Notes (Signed)
Chesley Mires to be D/C'd Home per MD order.  Discussed with the patient and all questions fully answered.  VSS, Skin clean, dry and intact without evidence of skin break down, no evidence of skin tears noted. IV catheter discontinued intact. Site without signs and symptoms of complications. Dressing and pressure applied.  An After Visit Summary was printed and given to the patient. Patient received prescription.  D/c education completed with patient/family including follow up instructions, medication list, d/c activities limitations if indicated, with other d/c instructions as indicated by MD - patient able to verbalize understanding, all questions fully answered.   Patient instructed to return to ED, call 911, or call MD for any changes in condition.   Patient escorted via [place], and D/C home via private auto.  Dorris Carnes 09/23/2017 5:20 PM

## 2017-09-23 NOTE — Progress Notes (Signed)
Pt arrived to unit and transferred to bed. Identified appropriately and assessed. Upon arrival, pt has laceration to right forehead that is closed with sutures and multiple lacerations to right arm and hand. Pt also presents with bruising to both knees. Neuro check WNL. Pt placed on telemetry and oriented to room and unit. Pt reports pain 4/10 to right head, PRN tylenol given. Will continue to monitor.

## 2017-09-23 NOTE — Care Management Note (Signed)
Case Management Note  Patient Details  Name: Patrick Walker MRN: [id number] Date of Birth: 07-27-1925  Subjective/Objective:           Presents s/p a fall, suffered skull fx.    Yobany Vroom ([address] Josha Weekley [phone]     PCP: Claris Gower  Action/Plan: Transition to home with family. Family to provide 24/7 supervision for pt once d/c.  Expected Discharge Date:  09/23/17               Expected Discharge Plan:  [place]  In-House Referral:     Discharge planning Services  CM Consult  Post Acute Care Choice:    Choice offered to:  Patient  DME Arranged:    DME Agency:     HH Arranged:  PT, Nurse's Aide, Social Work [employer] Agency:  [facility]  Status of Service:  Completed, signed off  If discussed at [employer], dates discussed:    Additional Comments:  Sharin Mons, RN 09/23/2017, 5:07 PM

## 2017-09-23 NOTE — Evaluation (Signed)
Physical Therapy Evaluation Patient Details Name: Patrick Walker MRN: [id number] DOB: Nov 03, 1924 Today's Date: 09/23/2017   History of Present Illness  84 year old came to the hospital medical history significant of A flutter on eliquis, aortic insufficiency, CKD stage III, cr 1.5, who presents after he sustained a fall from a standing position. He is currently neuro intact with no deficits. He sustained a open depressed frontal skull fracture with some pneumocephalus. No hemorrhage noted on CT scan.  Clinical Impression  Pt admitted with above diagnosis. Pt currently with functional limitations due to the deficits listed below (see PT Problem List). PTA pt independent with ambulation with occasional use of SPC, independent with ADLs, wife assist with cooking and cleaning. Pt wife currently hospitalized and pt was living home alone with intermittent supervision. Pt is currently minA for transfers and ambulation of 100 feet with RW. Pt requires minA for instability with gait and standing. PT currently recommending SNF level care for improving stability and for RW training. Pt son would prefer d/c home with HHPT which would be appropriate only with 24 hour supervision. Pt will benefit from skilled acute PT to increase their independence and safety with mobility until d/c.        Follow Up Recommendations SNF    Equipment Recommendations  Rolling walker with 5" wheels    Recommendations for Other Services       Precautions / Restrictions Precautions Precautions: Fall Restrictions Weight Bearing Restrictions: No      Mobility  Bed Mobility               General bed mobility comments: seated EOB on entry  Transfers Overall transfer level: Needs assistance Equipment used: Rolling walker (2 wheeled) Transfers: Sit to/from Stand Sit to Stand: Min assist         General transfer comment: minA for steadying with RW, good power up  Ambulation/Gait Ambulation/Gait assistance: Min  assist Ambulation Distance (Feet): 100 Feet Assistive device: Rolling walker (2 wheeled) Gait Pattern/deviations: Step-through pattern;Decreased step length - right;Decreased step length - left;Shuffle;Trunk flexed Gait velocity: slowed Gait velocity interpretation: Below normal speed for age/gender General Gait Details: minA for steadying with RW, slow shuffling gait, 1 x LoB requiring increased assist to maintain balance, pt unfamiliar with RW usage requiring vc for proximity to RW, and keeping on floor to navigate around obstacles           Balance Overall balance assessment: Needs assistance Sitting-balance support: No upper extremity supported;Feet supported Sitting balance-Leahy Scale: Fair     Standing balance support: Bilateral upper extremity supported Standing balance-Leahy Scale: Poor Standing balance comment: required UE assist to maintain balance                              Pertinent Vitals/Pain Pain Assessment: Faces Faces Pain Scale: Hurts even more Pain Location: head Pain Descriptors / Indicators: Headache;Grimacing Pain Intervention(s): Limited activity within patient's tolerance;Monitored during session;Repositioned    Home Living Family/patient expects to be discharged to:: Private residence Living Arrangements: Alone Available Help at Discharge: Family;Available PRN/intermittently Type of Home: House Home Access: Stairs to enter Entrance Stairs-Rails: Right Entrance Stairs-Number of Steps: 5 Home Layout: Two level;Able to live on main level with bedroom/bathroom Home Equipment: Gilford Rile - 2 wheels;Cane - single point      Prior Function Level of Independence: Independent         Comments: ocassionally used SPC, wife currently in hospital so  home alone, reports independent with ADLs wife took care of cooking and shopping     Hand Dominance   Dominant Hand: Right    Extremity/Trunk Assessment   Upper Extremity Assessment Upper  Extremity Assessment: Defer to OT evaluation    Lower Extremity Assessment Lower Extremity Assessment: RLE deficits/detail;LLE deficits/detail RLE Deficits / Details: AROM WFL, strength grossly 4/5 RLE Sensation: (in tact) LLE Deficits / Details: AROM WFL, strength grossly 4/5 LLE Sensation: (intact )       Communication   Communication: HOH  Cognition Arousal/Alertness: Awake/alert Behavior During Therapy: WFL for tasks assessed/performed Overall Cognitive Status: History of cognitive impairments - at baseline                                 General Comments: some memory problems recalling home set up      General Comments General comments (skin integrity, edema, etc.): son present during evaluation and provided clarification on home environment and level of supervision available        Assessment/Plan    PT Assessment Patient needs continued PT services  PT Problem List Decreased balance;Decreased mobility;Decreased knowledge of use of DME;Decreased safety awareness;Pain       PT Treatment Interventions DME instruction;Gait training;Stair training;Functional mobility training;Therapeutic activities;Therapeutic exercise;Balance training;Cognitive remediation;Patient/family education    PT Goals (Current goals can be found in the Care Plan section)  Acute Rehab PT Goals Patient Stated Goal: see wife PT Goal Formulation: With patient Time For Goal Achievement: 10/07/17 Potential to Achieve Goals: Fair    Frequency Min 3X/week   Barriers to discharge Decreased caregiver support         AM-PAC PT "6 Clicks" Daily Activity  Outcome Measure Difficulty turning over in bed (including adjusting bedclothes, sheets and blankets)?: A Little Difficulty moving from lying on back to sitting on the side of the bed? : A Little Difficulty sitting down on and standing up from a chair with arms (e.g., wheelchair, bedside commode, etc,.)?: A Little Help needed moving  to and from a bed to chair (including a wheelchair)?: A Little Help needed walking in hospital room?: A Little Help needed climbing 3-5 steps with a railing? : A Little 6 Click Score: 18    End of Session Equipment Utilized During Treatment: Gait belt Activity Tolerance: Patient tolerated treatment well Patient left: in chair;with call bell/phone within reach;with chair alarm set;with family/visitor present Nurse Communication: Mobility status PT Visit Diagnosis: Unsteadiness on feet (R26.81);Other abnormalities of gait and mobility (R26.89);Repeated falls (R29.6);Muscle weakness (generalized) (M62.81);History of falling (Z91.81);Difficulty in walking, not elsewhere classified (R26.2);Pain Pain - part of body: (head)    Time: 9417-4081 PT Time Calculation (min) (ACUTE ONLY): 31 min   Charges:   PT Evaluation $PT Eval Moderate Complexity: 1 Mod PT Treatments $Gait Training: 8-22 mins   PT G Codes:        Lesleigh Hughson B. Migdalia Dk PT, DPT Acute Rehabilitation  [phone] Pager [phone]    [place] 09/23/2017, 1:05 PM

## 2017-09-23 NOTE — Care Management Obs Status (Signed)
[place] NOTIFICATION   Patient Details  Name: Patrick Walker MRN: [id number] Date of Birth: 1924/10/18   Medicare Observation Status Notification Given:  Yes    Carles Collet, RN 09/23/2017, 9:57 AM

## 2017-09-23 NOTE — Progress Notes (Signed)
PT Cancellation Note  Patient Details Name: Patrick Walker MRN: [id number] DOB: 1925/01/21   Cancelled Treatment:    Reason Eval/Treat Not Completed: (P) Patient at procedure or test/unavailable Pt off floor having CT completed. PT will follow back later for evaluation.  Keiandre Cygan B. Migdalia Dk PT, DPT Acute Rehabilitation  [phone] Pager [phone]     [place] 09/23/2017, 9:34 AM

## 2017-09-23 NOTE — Discharge Summary (Signed)
Discharge Summary  Patrick Walker YQM:[id number] DOB: [birth year]  PCP: Leonard Downing, MD  Admit date: 09/22/2017 Discharge date: 09/23/2017  Time spent: <30 mins  Recommendations for Outpatient Follow-up:  1. PCP 2. Neurosurgery  Discharge Diagnoses:  Active Hospital Problems   Diagnosis Date Noted  . Skull fracture ([place]) 09/22/2017  . Pneumocephalus, traumatic 09/22/2017  . Fall at home, initial encounter 09/22/2017  . Long term current use of anticoagulant 08/04/2011  . Aortic valve sclerosis   . Aortic insufficiency   . Atrial flutter [facility])     Resolved Hospital Problems  No resolved problems to display.    Discharge Condition: Stable  Diet recommendation: Heart healthy  Vitals:   09/23/17 0539 09/23/17 1429  BP: (!) 123/56 (!) 129/51  Pulse: 61 72  Resp:  16  Temp: 98.7 F (37.1 C) 98 F (36.7 C)  SpO2: 97% 96%    History of present illness:  Patrick Walker is a 84 y.o. male with medical history significant of A flutter on eliquis, aortic insufficiency, CKD stage III who presents after a mechanical fall. Patient report everything happen so fast, he fell down and hit his head. He denies dizziness prior to episode. He denies chest pain, vision change, weakness. He is pleasantly confused and his has history of dementia. In the ED, pt was found to have acute right frontal skull fracture involving the roof of the frontal sinus resulting in extra-axial air and pneumocephalus. No significant hemorrhage, edema or shift. Pt was evaluated by neurosurgery, recommend IV antibiotics, admission and a repeat CT which was done with no significant changes from prior CT.  Today, pt denies any new symptoms, denies chest pain, SOB, headache, vision changes, no focal neurologic deficit/ weakness. PT evaluated pt and recommended SNF, pt son who is POA refused stating he will arrange for 24H supervision as pt wife (his mother) is also admitted in the hospital. Son reports pt didn't have  a good experience during his stay in SNF after surgery to hip. Son would also want [place] services.   Hospital Course:  Active Problems:   Atrial flutter ([facility])   Aortic valve sclerosis   Aortic insufficiency   Long term current use of anticoagulant   Skull fracture ([facility])   Pneumocephalus, traumatic   Fall at home, initial encounter  Skull fracture, pneumocephalus S/p after a mechanical fall Evaluated by Neurosurgery who recommends repeat CT, of which was unchanged as mentioned above Hold eliquis for one week until cleared by neurosurgery/PCP Discharge on prophylaxis PO Augmentin and PO doxycycline for another 6 days, for a total of 7 days  Follow up with neurosurgery and PCP in 1 week  PCP to repeat BMP and CBC   Mechanical fall PT rec SNF, son refused as detailed above HH aide, PT and SW ordered  A. flutter   Hold eliquis due to recent head trauma for 1 week Continue cartia  HTN Continue cartia  CKD stage 3 Stable, follow up with PCP   Procedures:  None  Consultations:  Neurosurgery  Discharge Exam: BP (!) 129/51 (BP Location: Right Arm)   Pulse 72   Temp 98 F (36.7 C) (Oral)   Resp 16   Ht 5\' 5"  (1.651 m)   Wt 45.4 kg (100 lb)   SpO2 96%   BMI 16.64 kg/m   General: Alert, awake, NAD, laceration noted on forehead  Cardiovascular: S1, S2 present, systolic murmur  Respiratory: Chest clear bilaterally   Discharge Instructions You were cared  for by a hospitalist during your hospital stay. If you have any questions about your discharge medications or the care you received while you were in the hospital after you are discharged, you can call the unit and asked to speak with the hospitalist on call if the hospitalist that took care of you is not available. Once you are discharged, your primary care physician will handle any further medical issues. Please note that NO REFILLS for any discharge medications will be authorized once you are discharged, as it is  imperative that you return to your primary care physician (or establish a relationship with a primary care physician if you do not have one) for your aftercare needs so that they can reassess your need for medications and monitor your lab values.   Allergies as of 09/23/2017   No Known Allergies     Medication List    STOP taking these medications   ELIQUIS 2.5 MG Tabs tablet Generic drug:  apixaban     TAKE these medications   amoxicillin-clavulanate 875-125 MG tablet Commonly known as:  AUGMENTIN Take 1 tablet by mouth 2 (two) times daily for 6 days. Start taking on:  09/24/2017   CARTIA XT 240 MG 24 hr capsule Generic drug:  diltiazem TAKE 1 CAPSULE BY MOUTH ONCE DAILY. MUST HAVE OFFICE VISIT BEFORE FURTHER REFILLS.   doxycycline 100 MG capsule Commonly known as:  VIBRAMYCIN Take 1 capsule (100 mg total) by mouth 2 (two) times daily for 6 days.   feeding supplement (ENSURE ENLIVE) Liqd Take 237 mLs by mouth 3 (three) times daily between meals. What changed:    when to take this  reasons to take this   HYDROcodone-acetaminophen 5-325 MG tablet Commonly known as:  NORCO/VICODIN Take 1 tablet by mouth every 6 (six) hours as needed for severe pain.   latanoprost 0.005 % ophthalmic solution Commonly known as:  XALATAN Place 1 drop into both eyes at bedtime.   loperamide 2 MG tablet Commonly known as:  IMODIUM A-D Take 2 mg by mouth 4 (four) times daily as needed for diarrhea or loose stools.   polyethylene glycol packet Commonly known as:  MIRALAX Take 17 g by mouth daily.   PRENATAL VITAMIN PO Take 1 tablet by mouth daily.   tamsulosin 0.4 MG Caps capsule Commonly known as:  FLOMAX Take 0.4 mg by mouth at bedtime.   vitamin B-12 100 MCG tablet Commonly known as:  CYANOCOBALAMIN Take 100 mcg by mouth daily.      No Known Allergies Follow-up Information    Leonard Downing, MD. Schedule an appointment as soon as possible for a visit in 1 week(s).     Specialty:  Family Medicine Contact information: [place] [address] [phone]        Kary Kos, MD. Schedule an appointment as soon as possible for a visit in 1 week(s).   Specialty:  Neurosurgery Contact information: [address] [place] [address] [phone]            The results of significant diagnostics from this hospitalization (including imaging, microbiology, ancillary and laboratory) are listed below for reference.    Significant Diagnostic Studies: Ct Head Wo Contrast  Result Date: 09/23/2017 CLINICAL DATA:  Head trauma, headache. EXAM: CT HEAD WITHOUT CONTRAST TECHNIQUE: Contiguous axial images were obtained from the base of the skull through the vertex without intravenous contrast. COMPARISON:  With 09/22/2017 FINDINGS: Brain: Extra-axial areas again noted in the anterior right  frontal region underlying the depressed right frontal bone fracture. Depression remains approximately 4 mm. No extra-axial or intra cerebral hemorrhage. There is atrophy and chronic small vessel disease changes. No mass-effect or midline shift. Vascular: No hyperdense vessel or unexpected calcification. Skull: Depressed right frontal fracture as above. Sinuses/Orbits: Visualized paranasal sinuses and mastoids clear. Orbital soft tissues unremarkable. Other: Soft tissue swelling over the right frontal region. IMPRESSION: Stable pneumocephalus underlying the depressed right frontal bone fracture. No intracranial hemorrhage. Atrophy, chronic small vessel disease. Electronically Signed   By: Rolm Baptise M.D.   On: 09/23/2017 10:12   Ct Head Wo Contrast  Result Date: 09/22/2017 CLINICAL DATA:  Patient fell outside today with laceration to the right side of forehead. No loss of consciousness. EXAM: CT HEAD WITHOUT CONTRAST CT CERVICAL SPINE WITHOUT CONTRAST TECHNIQUE: Multidetector CT imaging of the head and cervical spine was performed following the  standard protocol without intravenous contrast. Multiplanar CT image reconstructions of the cervical spine were also generated. COMPARISON:  02/15/2008 head CT FINDINGS: CT HEAD FINDINGS Brain: Extra-axial air is noted overlying the right frontal lobe secondary to a comminuted fracture involving the right frontal skull and roof of the frontal sinus with approximately 4 mm of depression of the fracture fragments noted. Overlying soft tissue swelling is noted of the forehead. No laceration is seen. No appreciable extra-axial hemorrhage. Chronic cerebral atrophy with moderate small vessel ischemic disease is noted. No acute intraparenchymal hemorrhage, large vascular territory infarct, intra-axial mass nor extra-axial fluid collections. Small right-sided basal ganglial lacunar infarcts are noted bilaterally. Vascular: No hyperdense vessels Skull: As above stated Sinuses/Orbits: Bilateral lens replacements. Intact orbits. No air-fluid levels within the paranasal sinuses or mastoids. Other: None CT CERVICAL SPINE FINDINGS Alignment: Slight reversal cervical lordosis secondary to degenerative disc disease, apex at C5-6. Intact atlantal dental interval with osteoarthritic joint space narrowing. Calcifications along the transverse ligament. Skull base and vertebrae: No skull base fracture. Soft tissues and spinal canal: No prevertebral fluid or swelling. No visible canal hematoma. Disc levels: Mild disc space narrowing C4-5 with moderate-to-marked at C5-6 and C6-7 with small posterior marginal osteophytes at C5-6 and C6-7. Minimal bilateral neural foraminal encroachment from osteophytes at C5-6 and C6-7. No jumped or perched facets. Upper chest: Negative. Other: None IMPRESSION: 1. Acute right frontal skull fracture involving the roof of the frontal sinus resulting in extra-axial air and pneumocephalus. No significant hemorrhage, edema or shift. These results were called by telephone at the time of interpretation on  09/22/2017 at 7:08 pm to Dr. Nanda Quinton , who verbally acknowledged these results. 2. Atrophy with moderate degree of small vessel ischemia. Chronic bilateral basal ganglial lacunar infarcts. 3. Cervical spondylosis without acute cervical spine fracture. Electronically Signed   By: Ashley Royalty M.D.   On: 09/22/2017 19:10   Ct Cervical Spine Wo Contrast  Result Date: 09/22/2017 CLINICAL DATA:  Patient fell outside today with laceration to the right side of forehead. No loss of consciousness. EXAM: CT HEAD WITHOUT CONTRAST CT CERVICAL SPINE WITHOUT CONTRAST TECHNIQUE: Multidetector CT imaging of the head and cervical spine was performed following the standard protocol without intravenous contrast. Multiplanar CT image reconstructions of the cervical spine were also generated. COMPARISON:  02/15/2008 head CT FINDINGS: CT HEAD FINDINGS Brain: Extra-axial air is noted overlying the right frontal lobe secondary to a comminuted fracture involving the right frontal skull and roof of the frontal sinus with approximately 4 mm of depression of the fracture fragments noted. Overlying soft tissue swelling is noted  of the forehead. No laceration is seen. No appreciable extra-axial hemorrhage. Chronic cerebral atrophy with moderate small vessel ischemic disease is noted. No acute intraparenchymal hemorrhage, large vascular territory infarct, intra-axial mass nor extra-axial fluid collections. Small right-sided basal ganglial lacunar infarcts are noted bilaterally. Vascular: No hyperdense vessels Skull: As above stated Sinuses/Orbits: Bilateral lens replacements. Intact orbits. No air-fluid levels within the paranasal sinuses or mastoids. Other: None CT CERVICAL SPINE FINDINGS Alignment: Slight reversal cervical lordosis secondary to degenerative disc disease, apex at C5-6. Intact atlantal dental interval with osteoarthritic joint space narrowing. Calcifications along the transverse ligament. Skull base and vertebrae: No skull  base fracture. Soft tissues and spinal canal: No prevertebral fluid or swelling. No visible canal hematoma. Disc levels: Mild disc space narrowing C4-5 with moderate-to-marked at C5-6 and C6-7 with small posterior marginal osteophytes at C5-6 and C6-7. Minimal bilateral neural foraminal encroachment from osteophytes at C5-6 and C6-7. No jumped or perched facets. Upper chest: Negative. Other: None IMPRESSION: 1. Acute right frontal skull fracture involving the roof of the frontal sinus resulting in extra-axial air and pneumocephalus. No significant hemorrhage, edema or shift. These results were called by telephone at the time of interpretation on 09/22/2017 at 7:08 pm to Dr. Nanda Quinton , who verbally acknowledged these results. 2. Atrophy with moderate degree of small vessel ischemia. Chronic bilateral basal ganglial lacunar infarcts. 3. Cervical spondylosis without acute cervical spine fracture. Electronically Signed   By: Ashley Royalty M.D.   On: 09/22/2017 19:10    Microbiology: No results found for this or any previous visit (from the past 240 hour(s)).   Labs: Basic Metabolic Panel: Recent Labs  Lab 09/22/17 2006  NA 141  K 4.4  CL 106  CO2 25  GLUCOSE 94  BUN 34*  CREATININE 1.66*  CALCIUM 9.2   Liver Function Tests: Recent Labs  Lab 09/22/17 2006  AST 27  ALT 17  ALKPHOS 68  BILITOT 0.7  PROT 7.4  ALBUMIN 3.6   No results for input(s): LIPASE, AMYLASE in the last 168 hours. No results for input(s): AMMONIA in the last 168 hours. CBC: Recent Labs  Lab 09/22/17 2006 09/23/17 0441  WBC 8.6 9.6  NEUTROABS 6.6  --   HGB 12.3* 10.6*  HCT 36.7* 31.2*  MCV 98.7 98.7  PLT 155 133*   Cardiac Enzymes: No results for input(s): CKTOTAL, CKMB, CKMBINDEX, TROPONINI in the last 168 hours. BNP: BNP (last 3 results) No results for input(s): BNP in the last 8760 hours.  ProBNP (last 3 results) No results for input(s): PROBNP in the last 8760 hours.  CBG: No results for  input(s): GLUCAP in the last 168 hours.     Signed:  Alma Friendly, MD [facility] 09/23/2017, 4:23 PM

## 2017-10-19 ENCOUNTER — Ambulatory Visit
Admission: RE | Admit: 2017-10-19 | Discharge: 2017-10-19 | Disposition: A | Discharge status: Home / Self Care | Payer: Medicare Other | Source: Ambulatory Visit | Attending: Family Medicine | Admitting: Family Medicine

## 2017-10-19 ENCOUNTER — Other Ambulatory Visit: Payer: Self-pay | Admitting: Family Medicine

## 2017-10-19 DIAGNOSIS — R609 Edema, unspecified: Secondary | ICD-10-CM

## 2017-11-12 ENCOUNTER — Ambulatory Visit (INDEPENDENT_AMBULATORY_CARE_PROVIDER_SITE_OTHER): Payer: Medicare Other | Admitting: Podiatry

## 2017-11-12 ENCOUNTER — Encounter: Payer: Self-pay | Admitting: Podiatry

## 2017-11-12 VITALS — BP 116/64 | HR 79 | Ht 62.0 in | Wt 110.0 lb

## 2017-11-12 DIAGNOSIS — M79609 Pain in unspecified limb: Secondary | ICD-10-CM | POA: Diagnosis not present

## 2017-11-12 DIAGNOSIS — M79676 Pain in unspecified toe(s): Secondary | ICD-10-CM | POA: Diagnosis not present

## 2017-11-12 DIAGNOSIS — B351 Tinea unguium: Secondary | ICD-10-CM | POA: Diagnosis not present

## 2017-11-27 NOTE — Progress Notes (Signed)
Subjective:  Patient ID: Patrick Walker, male    DOB: [birth year],  MRN: [id number]  Chief Complaint  Patient presents with  . Nail Problem    bilateral great toenails are thick and painful   84 y.o. male presents with the above complaint.  Reports painfully thickened elongated toenails that he cannot care for himself. Past Medical History:  Diagnosis Date  . Aortic insufficiency    Mild, echo, November, 2011  . Aortic valve sclerosis    Echo, November, 2011  . Arthritis    "joints probably" (09/23/2017)  . Atrial fibrillation ([place])   . Atrial flutter [facility])    New diagnosis November, 2012  . CKD (chronic kidney disease), stage III ([place])    [name] 09/23/2017  . Difficulty in urination   . Ejection fraction    EF 55-60%, echo, November, 2012 ( rapid atrial flutter at that time)  . Fall from standing 09/22/2017   sustained a open depressed frontal skull fracture with some pneumocephalus. [name] 09/23/2017  . Glaucoma   . Hearing loss   . Irritable bowel disease   . Sinus bradycardia    Past Surgical History:  Procedure Laterality Date  . BACK SURGERY  2008-2009  . CATARACT EXTRACTION W/ INTRAOCULAR LENS  IMPLANT, BILATERAL Bilateral   . FRACTURE SURGERY    . HAND SURGERY Right    "chainsaw about cut it off"  . INGUINAL HERNIA REPAIR  03/02/2011  . INTRAMEDULLARY (IM) NAIL INTERTROCHANTERIC Right 12/15/2016   Procedure: INTRAMEDULLARY (IM) NAIL INTERTROCHANTRIC;  Surgeon: Renette Butters, MD;  Location: [facility];  Service: Orthopedics;  Laterality: Right;  . LEG SURGERY Left 1972   "crushed"    Current Outpatient Medications:  .  CARTIA XT 240 MG 24 hr capsule, TAKE 1 CAPSULE BY MOUTH ONCE DAILY. MUST HAVE OFFICE VISIT BEFORE FURTHER REFILLS., Disp: 15 capsule, Rfl: 0 .  feeding supplement, ENSURE ENLIVE, (ENSURE ENLIVE) LIQD, Take 237 mLs by mouth 3 (three) times daily between meals. (Patient taking differently: Take 237 mLs by mouth as needed (nutrition). ), Disp: 237 mL, Rfl:  12 .  HYDROcodone-acetaminophen (NORCO/VICODIN) 5-325 MG tablet, Take 1 tablet by mouth every 6 (six) hours as needed for severe pain. (Patient not taking: Reported on 09/22/2017), Disp: 20 tablet, Rfl: 0 .  latanoprost (XALATAN) 0.005 % ophthalmic solution, Place 1 drop into both eyes at bedtime., Disp: , Rfl:  .  loperamide (IMODIUM A-D) 2 MG tablet, Take 2 mg by mouth 4 (four) times daily as needed for diarrhea or loose stools., Disp: , Rfl:  .  polyethylene glycol (MIRALAX) packet, Take 17 g by mouth daily. (Patient not taking: Reported on 03/27/2016), Disp: 30 each, Rfl: 0 .  Prenatal Vit-Fe Fumarate-FA (PRENATAL VITAMIN PO), Take 1 tablet by mouth daily., Disp: , Rfl:  .  tamsulosin (FLOMAX) 0.4 MG CAPS capsule, Take 0.4 mg by mouth at bedtime., Disp: , Rfl:  .  vitamin B-12 (CYANOCOBALAMIN) 100 MCG tablet, Take 100 mcg by mouth daily., Disp: , Rfl:   No Known Allergies  Objective:   Vitals:   11/12/17 1446  BP: 116/64  Pulse: 79   General AA&O x3. Normal mood and affect.  Vascular Pedal pulses palpable.  Neurologic Epicritic sensation grossly intact.  Dermatologic No open lesions. Skin normal texture and turgor. Toenails x 10 elongated, thickened, dystrophic.  Orthopedic: Pain to palpation about the toenails.   Assessment & Plan:  Patient was evaluated and treated and all questions answered.  Onychomycosis with pain  -  Nails palliatively debrided as below. -Educated on self-care  Procedure: Nail Debridement Rationale: pain  Type of Debridement: manual, sharp debridement. Instrumentation: Nail nipper, rotary burr. Number of Nails: 10   Return if symptoms worsen or fail to improve.

## 2018-02-25 ENCOUNTER — Other Ambulatory Visit: Payer: Self-pay | Admitting: Family Medicine

## 2018-02-25 DIAGNOSIS — R519 Headache, unspecified: Secondary | ICD-10-CM

## 2018-02-25 DIAGNOSIS — R51 Headache: Principal | ICD-10-CM

## 2018-06-11 ENCOUNTER — Encounter (HOSPITAL_COMMUNITY): Payer: Self-pay

## 2018-06-11 ENCOUNTER — Other Ambulatory Visit: Payer: Self-pay

## 2018-06-11 ENCOUNTER — Inpatient Hospital Stay (HOSPITAL_COMMUNITY)
Admission: EM | Admit: 2018-06-11 | Discharge: 2018-06-13 | DRG: 552 | Disposition: A | Discharge status: Home Health | Payer: Medicare Other | Attending: Internal Medicine | Admitting: Internal Medicine

## 2018-06-11 ENCOUNTER — Emergency Department (HOSPITAL_COMMUNITY): Payer: Medicare Other

## 2018-06-11 DIAGNOSIS — N183 Chronic kidney disease, stage 3 (moderate): Secondary | ICD-10-CM | POA: Diagnosis present

## 2018-06-11 DIAGNOSIS — Z961 Presence of intraocular lens: Secondary | ICD-10-CM | POA: Diagnosis present

## 2018-06-11 DIAGNOSIS — S0181XA Laceration without foreign body of other part of head, initial encounter: Secondary | ICD-10-CM | POA: Diagnosis present

## 2018-06-11 DIAGNOSIS — S129XXA Fracture of neck, unspecified, initial encounter: Secondary | ICD-10-CM

## 2018-06-11 DIAGNOSIS — N400 Enlarged prostate without lower urinary tract symptoms: Secondary | ICD-10-CM | POA: Diagnosis present

## 2018-06-11 DIAGNOSIS — R402362 Coma scale, best motor response, obeys commands, at arrival to emergency department: Secondary | ICD-10-CM | POA: Diagnosis present

## 2018-06-11 DIAGNOSIS — Y92009 Unspecified place in unspecified non-institutional (private) residence as the place of occurrence of the external cause: Secondary | ICD-10-CM

## 2018-06-11 DIAGNOSIS — W19XXXA Unspecified fall, initial encounter: Secondary | ICD-10-CM

## 2018-06-11 DIAGNOSIS — H409 Unspecified glaucoma: Secondary | ICD-10-CM | POA: Diagnosis present

## 2018-06-11 DIAGNOSIS — R55 Syncope and collapse: Secondary | ICD-10-CM | POA: Diagnosis present

## 2018-06-11 DIAGNOSIS — Z9841 Cataract extraction status, right eye: Secondary | ICD-10-CM | POA: Diagnosis not present

## 2018-06-11 DIAGNOSIS — R402142 Coma scale, eyes open, spontaneous, at arrival to emergency department: Secondary | ICD-10-CM | POA: Diagnosis present

## 2018-06-11 DIAGNOSIS — I4892 Unspecified atrial flutter: Secondary | ICD-10-CM | POA: Diagnosis present

## 2018-06-11 DIAGNOSIS — R7989 Other specified abnormal findings of blood chemistry: Secondary | ICD-10-CM | POA: Diagnosis not present

## 2018-06-11 DIAGNOSIS — I351 Nonrheumatic aortic (valve) insufficiency: Secondary | ICD-10-CM | POA: Diagnosis present

## 2018-06-11 DIAGNOSIS — T502X5A Adverse effect of carbonic-anhydrase inhibitors, benzothiadiazides and other diuretics, initial encounter: Secondary | ICD-10-CM | POA: Diagnosis present

## 2018-06-11 DIAGNOSIS — I4891 Unspecified atrial fibrillation: Secondary | ICD-10-CM | POA: Diagnosis present

## 2018-06-11 DIAGNOSIS — H9190 Unspecified hearing loss, unspecified ear: Secondary | ICD-10-CM | POA: Diagnosis present

## 2018-06-11 DIAGNOSIS — W1789XA Other fall from one level to another, initial encounter: Secondary | ICD-10-CM | POA: Diagnosis present

## 2018-06-11 DIAGNOSIS — S12000A Unspecified displaced fracture of first cervical vertebra, initial encounter for closed fracture: Secondary | ICD-10-CM | POA: Diagnosis present

## 2018-06-11 DIAGNOSIS — I249 Acute ischemic heart disease, unspecified: Secondary | ICD-10-CM | POA: Diagnosis present

## 2018-06-11 DIAGNOSIS — Z9181 History of falling: Secondary | ICD-10-CM

## 2018-06-11 DIAGNOSIS — R402252 Coma scale, best verbal response, oriented, at arrival to emergency department: Secondary | ICD-10-CM | POA: Diagnosis present

## 2018-06-11 DIAGNOSIS — Z66 Do not resuscitate: Secondary | ICD-10-CM | POA: Diagnosis present

## 2018-06-11 DIAGNOSIS — S0990XA Unspecified injury of head, initial encounter: Secondary | ICD-10-CM

## 2018-06-11 DIAGNOSIS — Z9842 Cataract extraction status, left eye: Secondary | ICD-10-CM

## 2018-06-11 DIAGNOSIS — N179 Acute kidney failure, unspecified: Secondary | ICD-10-CM | POA: Diagnosis present

## 2018-06-11 DIAGNOSIS — E860 Dehydration: Secondary | ICD-10-CM | POA: Diagnosis present

## 2018-06-11 LAB — CBC WITH DIFFERENTIAL/PLATELET
Abs Immature Granulocytes: 0.05 10*3/uL (ref 0.00–0.07)
BASOS ABS: 0 10*3/uL (ref 0.0–0.1)
Basophils Relative: 0 %
EOS ABS: 0 10*3/uL (ref 0.0–0.5)
Eosinophils Relative: 1 %
HCT: 37.1 % — ABNORMAL LOW (ref 39.0–52.0)
Hemoglobin: 11 g/dL — ABNORMAL LOW (ref 13.0–17.0)
IMMATURE GRANULOCYTES: 1 %
Lymphocytes Relative: 13 %
Lymphs Abs: 0.6 10*3/uL — ABNORMAL LOW (ref 0.7–4.0)
MCH: 31.1 pg (ref 26.0–34.0)
MCHC: 29.6 g/dL — ABNORMAL LOW (ref 30.0–36.0)
MCV: 104.8 fL — AB (ref 80.0–100.0)
Monocytes Absolute: 0.6 10*3/uL (ref 0.1–1.0)
Monocytes Relative: 14 %
NEUTROS PCT: 71 %
Neutro Abs: 3.2 10*3/uL (ref 1.7–7.7)
PLATELETS: 154 10*3/uL (ref 150–400)
RBC: 3.54 MIL/uL — ABNORMAL LOW (ref 4.22–5.81)
RDW: 12.3 % (ref 11.5–15.5)
WBC: 4.5 10*3/uL (ref 4.0–10.5)
nRBC: 0 % (ref 0.0–0.2)

## 2018-06-11 LAB — BASIC METABOLIC PANEL
ANION GAP: 9 (ref 5–15)
BUN: 38 mg/dL — ABNORMAL HIGH (ref 8–23)
CALCIUM: 9 mg/dL (ref 8.9–10.3)
CO2: 23 mmol/L (ref 22–32)
Chloride: 107 mmol/L (ref 98–111)
Creatinine, Ser: 2.06 mg/dL — ABNORMAL HIGH (ref 0.61–1.24)
GFR calc Af Amer: 31 mL/min — ABNORMAL LOW (ref >60)
GFR, EST NON AFRICAN AMERICAN: 27 mL/min — AB (ref >60)
GLUCOSE: 151 mg/dL — AB (ref 70–99)
Potassium: 4.6 mmol/L (ref 3.5–5.1)
Sodium: 139 mmol/L (ref 135–145)

## 2018-06-11 LAB — TROPONIN I
TROPONIN I: 0.07 ng/mL — AB (ref <0.03)
Troponin I: 0.07 ng/mL (ref <0.03)

## 2018-06-11 MED ORDER — ONDANSETRON HCL 4 MG/2ML IJ SOLN
4.0000 mg | Freq: Once | INTRAMUSCULAR | Status: AC
  Administered 2018-06-11: 4 mg via INTRAVENOUS
  Filled 2018-06-11: qty 2

## 2018-06-11 MED ORDER — ENSURE ENLIVE PO LIQD
237.0000 mL | Freq: Three times a day (TID) | ORAL | Status: DC
  Administered 2018-06-12 (×3): 237 mL via ORAL

## 2018-06-11 MED ORDER — FENTANYL CITRATE (PF) 100 MCG/2ML IJ SOLN
25.0000 ug | INTRAMUSCULAR | Status: DC | PRN
  Administered 2018-06-11 – 2018-06-12 (×2): 25 ug via INTRAVENOUS
  Filled 2018-06-11 (×2): qty 2

## 2018-06-11 MED ORDER — POLYETHYLENE GLYCOL 3350 17 G PO PACK
17.0000 g | PACK | Freq: Every day | ORAL | Status: DC
  Administered 2018-06-12: 17 g via ORAL
  Filled 2018-06-11: qty 1

## 2018-06-11 MED ORDER — ONDANSETRON HCL 4 MG/2ML IJ SOLN
4.0000 mg | Freq: Four times a day (QID) | INTRAMUSCULAR | Status: DC | PRN
  Administered 2018-06-12 – 2018-06-13 (×2): 4 mg via INTRAVENOUS
  Filled 2018-06-11 (×2): qty 2

## 2018-06-11 MED ORDER — FENTANYL CITRATE (PF) 100 MCG/2ML IJ SOLN: 25.0000 ug | Freq: Once | INTRAMUSCULAR | Status: DC

## 2018-06-11 MED ORDER — MORPHINE SULFATE (PF) 2 MG/ML IV SOLN: 1.0000 mg | INTRAVENOUS | Status: DC | PRN

## 2018-06-11 MED ORDER — DILTIAZEM HCL ER COATED BEADS 240 MG PO CP24
240.0000 mg | ORAL_CAPSULE | Freq: Every day | ORAL | Status: DC
  Administered 2018-06-12 – 2018-06-13 (×2): 240 mg via ORAL
  Filled 2018-06-11 (×2): qty 1

## 2018-06-11 MED ORDER — TAMSULOSIN HCL 0.4 MG PO CAPS
0.4000 mg | ORAL_CAPSULE | Freq: Every day | ORAL | Status: DC
  Administered 2018-06-11 – 2018-06-12 (×2): 0.4 mg via ORAL
  Filled 2018-06-11 (×2): qty 1

## 2018-06-11 MED ORDER — LIDOCAINE HCL 2 % IJ SOLN
20.0000 mL | Freq: Once | INTRAMUSCULAR | Status: AC
  Administered 2018-06-11: 400 mg
  Filled 2018-06-11: qty 20

## 2018-06-11 MED ORDER — SODIUM CHLORIDE 0.9 % IV SOLN
INTRAVENOUS | Status: DC
  Administered 2018-06-12: 04:00:00 via INTRAVENOUS

## 2018-06-11 MED ORDER — LATANOPROST 0.005 % OP SOLN
1.0000 [drp] | Freq: Every day | OPHTHALMIC | Status: DC
  Administered 2018-06-11 – 2018-06-12 (×2): 1 [drp] via OPHTHALMIC
  Filled 2018-06-11: qty 2.5

## 2018-06-11 MED ORDER — HEPARIN SODIUM (PORCINE) 5000 UNIT/ML IJ SOLN
5000.0000 [IU] | Freq: Two times a day (BID) | INTRAMUSCULAR | Status: DC
  Administered 2018-06-11 – 2018-06-13 (×4): 5000 [IU] via SUBCUTANEOUS
  Filled 2018-06-11 (×4): qty 1

## 2018-06-11 MED ORDER — ONDANSETRON HCL 4 MG PO TABS: 4.0000 mg | ORAL_TABLET | Freq: Four times a day (QID) | ORAL | Status: DC | PRN

## 2018-06-11 MED ORDER — ACETAMINOPHEN 325 MG PO TABS
650.0000 mg | ORAL_TABLET | Freq: Four times a day (QID) | ORAL | Status: DC | PRN
  Administered 2018-06-12: 650 mg via ORAL
  Filled 2018-06-11: qty 2

## 2018-06-11 MED ORDER — HYDROCODONE-ACETAMINOPHEN 5-325 MG PO TABS
1.0000 | ORAL_TABLET | ORAL | Status: DC | PRN
  Administered 2018-06-12 – 2018-06-13 (×4): 1 via ORAL
  Filled 2018-06-11 (×5): qty 1

## 2018-06-11 MED ORDER — ALBUTEROL SULFATE (2.5 MG/3ML) 0.083% IN NEBU: 2.5000 mg | INHALATION_SOLUTION | RESPIRATORY_TRACT | Status: DC | PRN

## 2018-06-11 MED ORDER — ACETAMINOPHEN 650 MG RE SUPP: 650.0000 mg | Freq: Four times a day (QID) | RECTAL | Status: DC | PRN

## 2018-06-11 MED ORDER — SODIUM CHLORIDE 0.9 % IV SOLN
INTRAVENOUS | Status: DC
  Administered 2018-06-11: 17:00:00 via INTRAVENOUS

## 2018-06-11 MED ORDER — MORPHINE SULFATE (PF) 2 MG/ML IV SOLN
2.0000 mg | Freq: Once | INTRAVENOUS | Status: AC
  Administered 2018-06-11: 2 mg via INTRAVENOUS
  Filled 2018-06-11: qty 1

## 2018-06-11 NOTE — ED Triage Notes (Signed)
Pt brought in by EMS due to fall. Pt was in truck and woke up on ground. Pt has lac on forehead and nose. Pt a&ox4. Pt c/o neck pain.

## 2018-06-11 NOTE — ED Notes (Signed)
Pt not comfortable standing at this point, too much pain

## 2018-06-11 NOTE — ED Notes (Signed)
Dr. Zenia Resides notified that troponin resulted at 0.07.

## 2018-06-11 NOTE — H&P (Addendum)
[facility]                                                                                    Patient Demographics  Patrick Walker, is a 84 y.o. male  CSN: [id number]  MRN: [id number]  DOB - February 25, 1925  Admit Date - 06/11/2018  Outpatient Primary MD for the patient is Leonard Downing, MD   With History of -  Past Medical History:  Diagnosis Date  . Aortic insufficiency    Mild, echo, November, 2011  . Aortic valve sclerosis    Echo, November, 2011  . Arthritis    "joints probably" (09/23/2017)  . Atrial fibrillation ([place])   . Atrial flutter [facility])    New diagnosis November, 2012  . CKD (chronic kidney disease), stage III ([place])    [name] 09/23/2017  . Difficulty in urination   . Ejection fraction    EF 55-60%, echo, November, 2012 ( rapid atrial flutter at that time)  . Fall from standing 09/22/2017   sustained a open depressed frontal skull fracture with some pneumocephalus. [name] 09/23/2017  . Glaucoma   . Hearing loss   . Irritable bowel disease   . Sinus bradycardia       Past Surgical History:  Procedure Laterality Date  . BACK SURGERY  2008-2009  . CATARACT EXTRACTION W/ INTRAOCULAR LENS  IMPLANT, BILATERAL Bilateral   . FRACTURE SURGERY    . HAND SURGERY Right    "chainsaw about cut it off"  . INGUINAL HERNIA REPAIR  03/02/2011  . INTRAMEDULLARY (IM) NAIL INTERTROCHANTERIC Right 12/15/2016   Procedure: INTRAMEDULLARY (IM) NAIL INTERTROCHANTRIC;  Surgeon: Renette Butters, MD;  Location: [facility];  Service: Orthopedics;  Laterality: Right;  . LEG SURGERY Left 1972   "crushed"    in for   Chief Complaint  Patient presents with  . Fall     HPI  Patrick Walker  is a 84 y.o. male, presenting to the emergency room status post fall from a sitting position while sitting on his truck, face down.  Most of the history was taken from the son who was at bedside and is the POA.  According to his son the patient fell after he had a syncopal episode.   Patient received pain medications and he could not give me a clear history.  In the emergency room the patient was found to have cervical fracture and elevated troponin.  His frontal laceration was repaired and neurosurgery was consulted.  Patient is hard of hearing as well   Review of Systems    Unable to obtain due to patient's condition   Social History Social History   Tobacco Use  . Smoking status: Never Smoker  . Smokeless tobacco: Never Used  Substance Use Topics  . Alcohol use: No     Family History No family history on file.   Prior to Admission medications   Medication Sig Start Date End Date Taking? Authorizing Provider  CARTIA XT 240 MG 24 hr capsule TAKE 1 CAPSULE BY MOUTH ONCE DAILY. MUST HAVE OFFICE VISIT BEFORE FURTHER REFILLS. 03/24/17   [place], Peter M, MD  feeding supplement, ENSURE ENLIVE, (ENSURE ENLIVE)  LIQD Take 237 mLs by mouth 3 (three) times daily between meals. Patient taking differently: Take 237 mLs by mouth as needed (nutrition).  02/25/16   Lavina Hamman, MD  HYDROcodone-acetaminophen (NORCO/VICODIN) 5-325 MG tablet Take 1 tablet by mouth every 6 (six) hours as needed for severe pain. Patient not taking: Reported on 09/22/2017 12/18/16   Kalman Shan Ratliff, DO  latanoprost (XALATAN) 0.005 % ophthalmic solution Place 1 drop into both eyes at bedtime.    [provider]  loperamide (IMODIUM A-D) 2 MG tablet Take 2 mg by mouth 4 (four) times daily as needed for diarrhea or loose stools.    [provider]  polyethylene glycol (MIRALAX) packet Take 17 g by mouth daily. Patient not taking: Reported on 03/27/2016 02/25/16   Lavina Hamman, MD  Prenatal Vit-Fe Fumarate-FA (PRENATAL VITAMIN PO) Take 1 tablet by mouth daily.    [provider]  tamsulosin (FLOMAX) 0.4 MG CAPS capsule Take 0.4 mg by mouth at bedtime.    [provider]  vitamin B-12 (CYANOCOBALAMIN) 100 MCG tablet Take 100 mcg by mouth daily.    [provider]    No Known Allergies  Physical Exam  Vitals  Blood pressure (!) 169/70, pulse 72, temperature 98.1 F (36.7 C), temperature source Oral, resp. rate 18, height 5\' 2"  (1.575 m), weight 54.4 kg, SpO2 97 %.   1. General well-developed elderly male, looks tired  2.  Pleasantly confused, received pain medications.  3. No F.N deficits, grossly, patient moving all extremities.  4. Ears and Eyes appear Normal, Conjunctivae clear, . Moist Oral Mucosa.  Frontal laceration noted  5.  Neck brace noted, cervical collar.  6. Symmetrical Chest wall movement, Good air movement bilaterally, CTAB.  7. RRR, loud systolic murmur heard.  8. Positive Bowel Sounds, Abdomen Soft, Non tender, No organomegaly appriciated,No rebound -guarding or rigidity.  9.  No Cyanosis, Normal Skin Turgor, No Skin Rash or Bruise.  10. Good muscle tone,  joints appear normal , no effusions, Normal ROM.    Data Review  CBC Recent Labs  Lab 06/11/18 1622  WBC 4.5  HGB 11.0*  HCT 37.1*  PLT 154  MCV 104.8*  MCH 31.1  MCHC 29.6*  RDW 12.3  LYMPHSABS 0.6*  MONOABS 0.6  EOSABS 0.0  BASOSABS 0.0   ------------------------------------------------------------------------------------------------------------------  Chemistries  Recent Labs  Lab 06/11/18 1622  NA 139  K 4.6  CL 107  CO2 23  GLUCOSE 151*  BUN 38*  CREATININE 2.06*  CALCIUM 9.0   ------------------------------------------------------------------------------------------------------------------ estimated creatinine clearance is 18.3 mL/min (A) (by C-G formula based on SCr of 2.06 mg/dL (H)). ------------------------------------------------------------------------------------------------------------------ No results for input(s): TSH, T4TOTAL, T3FREE, THYROIDAB in the last 72 hours.  Invalid input(s): FREET3   Coagulation profile No results for input(s): INR, PROTIME in the last 168  hours. ------------------------------------------------------------------------------------------------------------------- No results for input(s): DDIMER in the last 72 hours. -------------------------------------------------------------------------------------------------------------------  Cardiac Enzymes Recent Labs  Lab 06/11/18 1622  TROPONINI 0.07*   ------------------------------------------------------------------------------------------------------------------ Invalid input(s): POCBNP   ---------------------------------------------------------------------------------------------------------------  Urinalysis    Component Value Date/Time   COLORURINE YELLOW 12/17/2016 [place] 12/17/2016 1156   LABSPEC 1.018 12/17/2016 1156   PHURINE 5.0 12/17/2016 1156   GLUCOSEU 50 (A) 12/17/2016 1156   HGBUR SMALL (A) 12/17/2016 1156   BILIRUBINUR NEGATIVE 12/17/2016 1156   KETONESUR NEGATIVE 12/17/2016 1156   PROTEINUR 100 (A) 12/17/2016 1156   NITRITE NEGATIVE 12/17/2016 1156   LEUKOCYTESUR NEGATIVE 12/17/2016 1156    ----------------------------------------------------------------------------------------------------------------  Imaging results:   Ct Head Wo Contrast  Result Date: 06/11/2018 CLINICAL DATA:  Patient status post fall. EXAM: CT HEAD WITHOUT CONTRAST CT CERVICAL SPINE WITHOUT CONTRAST TECHNIQUE: Multidetector CT imaging of the head and cervical spine was performed following the standard protocol without intravenous contrast. Multiplanar CT image reconstructions of the cervical spine were also generated. COMPARISON:  Brain CT 09/23/2017; cervical spine CT 09/22/2017. FINDINGS: CT HEAD FINDINGS Brain: Ventricles and sulci are prominent compatible with atrophy. Periventricular and subcortical white matter hypodensity compatible with chronic microvascular ischemic changes. No evidence for acute cortically based infarct, intracranial hemorrhage, mass  lesion or mass-effect. Vascular: Unremarkable. Skull: Slight interval healing of previously described impacted right frontal skull fracture (image 43; series 4). Sinuses/Orbits: Paranasal sinuses are well aerated. Mastoid air cells unremarkable. Orbits are unremarkable. Other: None. CT CERVICAL SPINE FINDINGS Alignment: Normal anatomic alignment. Skull base and vertebrae: Multipart mildly displaced fractures through the anterior and posterior arches of the C1 vertebral body. There are at least 2 fractures with mild displacement through the posterior arch of C1 and 1 mildly displaced fracture through the right aspect of the anterior arch of C1. Degenerative changes at the level of the dens. Nondisplaced transverse fracture through the mid aspect of the dens (image 28; series 9). Soft tissues and spinal canal: No acute hematoma within the spinal canal. Prevertebral soft tissues unremarkable. Disc levels: Multilevel degenerative disc disease most pronounced C5-6 and C6-7. Upper chest: Unremarkable Other: None. IMPRESSION: 1. Multiple mildly displaced fractures through the C1 ring. There are at least 2 fractures of the posterior arch and 1 fracture through the anterior arch. 2. Nondisplaced fracture through the mid aspect of the dens. 3. No acute intracranial process. Critical Value/emergent results were called by telephone at the time of interpretation on 06/11/2018 at 5:04 pm to Dr. Lacretia Leigh , who verbally acknowledged these results. Electronically Signed   By: Lovey Newcomer M.D.   On: 06/11/2018 17:12   Ct Cervical Spine Wo Contrast  Result Date: 06/11/2018 CLINICAL DATA:  Patient status post fall. EXAM: CT HEAD WITHOUT CONTRAST CT CERVICAL SPINE WITHOUT CONTRAST TECHNIQUE: Multidetector CT imaging of the head and cervical spine was performed following the standard protocol without intravenous contrast. Multiplanar CT image reconstructions of the cervical spine were also generated. COMPARISON:  Brain CT  09/23/2017; cervical spine CT 09/22/2017. FINDINGS: CT HEAD FINDINGS Brain: Ventricles and sulci are prominent compatible with atrophy. Periventricular and subcortical white matter hypodensity compatible with chronic microvascular ischemic changes. No evidence for acute cortically based infarct, intracranial hemorrhage, mass lesion or mass-effect. Vascular: Unremarkable. Skull: Slight interval healing of previously described impacted right frontal skull fracture (image 43; series 4). Sinuses/Orbits: Paranasal sinuses are well aerated. Mastoid air cells unremarkable. Orbits are unremarkable. Other: None. CT CERVICAL SPINE FINDINGS Alignment: Normal anatomic alignment. Skull base and vertebrae: Multipart mildly displaced fractures through the anterior and posterior arches of the C1 vertebral body. There are at least 2 fractures with mild displacement through the posterior arch of C1 and 1 mildly displaced fracture through the right aspect of the anterior arch of C1. Degenerative changes at the level of the dens. Nondisplaced transverse fracture through the mid aspect of the dens (image 28; series 9). Soft tissues and spinal canal: No acute hematoma within the spinal canal. Prevertebral soft tissues unremarkable. Disc levels: Multilevel degenerative disc disease most pronounced C5-6 and C6-7. Upper chest: Unremarkable Other: None. IMPRESSION: 1. Multiple mildly displaced fractures through the C1 ring. There are at least 2 fractures  of the posterior arch and 1 fracture through the anterior arch. 2. Nondisplaced fracture through the mid aspect of the dens. 3. No acute intracranial process. Critical Value/emergent results were called by telephone at the time of interpretation on 06/11/2018 at 5:04 pm to Dr. Lacretia Leigh , who verbally acknowledged these results. Electronically Signed   By: Lovey Newcomer M.D.   On: 06/11/2018 17:12    My personal review of EKG: Normal sinus rhythm at 69 bpm  Assessment & Plan  Acute  coronary syndrome       Serial troponins       Consult cardiology in a.m.        Hold aspirin for now due to cervical fracture, neurosurgery consult, pending        Doubt he will undergo procedure due to his age and CODE STATUS  Syncope      Neurochecks  Cervical C1 multipart mildly displaced fractures through anterior and posterior arches/nondisplaced transverse fracture through the dens.       Neurosurgery on consult       Doubt he will undergo procedure  Renal failure; acute on chronic      Continue with IV fluids  BPH      FLomax  Aortic insufficiency     Status post echocardiogram in 2012 with 55 to 60%  History of A. Fib/flutter      In normal sinus rhythm at this time  Frontal laceration     Pertinent emergency room             DVT Prophylaxis Heparin  AM Labs Ordered, also please review Full Orders  Family Communication: Admission, patients condition and plan of care including tests being ordered have been discussed with the son who indicates understanding and agrees with the plan and Code Status.  Code Status DNR  Disposition Plan: Unknown  Time spent in minutes : 40 minutes  Condition GUARDED   @SIGNATURE @

## 2018-06-11 NOTE — ED Notes (Signed)
Aspen collar applied per Dr. Zenia Resides.

## 2018-06-11 NOTE — ED Provider Notes (Signed)
[place] EMERGENCY DEPARTMENT Provider Note   CSN: [id number] Arrival date & time: 06/11/18  1523     History   Chief Complaint Chief Complaint  Patient presents with  . Fall    HPI Patrick Walker is a 84 y.o. male.  84 year old male presents after unwitnessed fall from the bed of a pickup truck just prior to arrival.  Patient states he fell asleep and woke up on the ground.  Complains of pain to his lower cervical spine as well as his forehead.  Denies any weakness in his arms or legs.  No chest abdominal or pelvis pain.  No lower extremity discomfort.  EMS called patient placed in cervical collar and transported here.     Past Medical History:  Diagnosis Date  . Aortic insufficiency    Mild, echo, November, 2011  . Aortic valve sclerosis    Echo, November, 2011  . Arthritis    "joints probably" (09/23/2017)  . Atrial fibrillation ([place])   . Atrial flutter [facility])    New diagnosis November, 2012  . CKD (chronic kidney disease), stage III ([place])    [name] 09/23/2017  . Difficulty in urination   . Ejection fraction    EF 55-60%, echo, November, 2012 ( rapid atrial flutter at that time)  . Fall from standing 09/22/2017   sustained a open depressed frontal skull fracture with some pneumocephalus. [name] 09/23/2017  . Glaucoma   . Hearing loss   . Irritable bowel disease   . Sinus bradycardia     Patient Active Problem List   Diagnosis Date Noted  . Skull fracture ([place]) 09/22/2017  . Pneumocephalus, traumatic 09/22/2017  . Fall at home, initial encounter 09/22/2017  . Intertrochanteric fracture of right femur ([place]) 12/15/2016  . Malnutrition of moderate degree 02/24/2016  . Acute kidney injury ([place]) 02/23/2016  . Diverticulitis 02/23/2016  . Dehydration 02/23/2016  . Constipation 02/23/2016  . Nausea 02/23/2016  . Generalized weakness 02/23/2016  . Sinus bradycardia   . Long term current use of anticoagulant 08/04/2011  . Aortic valve  sclerosis   . Aortic insufficiency   . Atrial flutter ([place])   . TSH (thyroid-stimulating hormone deficiency)   . Hearing loss   . Shortness of breath   . Inguinal hernia - left s/p lap [facility] repair [id number] 03/16/2011    Past Surgical History:  Procedure Laterality Date  . BACK SURGERY  2008-2009  . CATARACT EXTRACTION W/ INTRAOCULAR LENS  IMPLANT, BILATERAL Bilateral   . FRACTURE SURGERY    . HAND SURGERY Right    "chainsaw about cut it off"  . INGUINAL HERNIA REPAIR  03/02/2011  . INTRAMEDULLARY (IM) NAIL INTERTROCHANTERIC Right 12/15/2016   Procedure: INTRAMEDULLARY (IM) NAIL INTERTROCHANTRIC;  Surgeon: Renette Butters, MD;  Location: [place];  Service: Orthopedics;  Laterality: Right;  . LEG SURGERY Left 1972   "crushed"        Home Medications    Prior to Admission medications   Medication Sig Start Date End Date Taking? Authorizing Provider  CARTIA XT 240 MG 24 hr capsule TAKE 1 CAPSULE BY MOUTH ONCE DAILY. MUST HAVE OFFICE VISIT BEFORE FURTHER REFILLS. 03/24/17   [place], Peter M, MD  feeding supplement, ENSURE ENLIVE, (ENSURE ENLIVE) LIQD Take 237 mLs by mouth 3 (three) times daily between meals. Patient taking differently: Take 237 mLs by mouth as needed (nutrition).  02/25/16   Lavina Hamman, MD  HYDROcodone-acetaminophen (NORCO/VICODIN) 5-325 MG tablet Take 1 tablet by mouth every  6 (six) hours as needed for severe pain. Patient not taking: Reported on 09/22/2017 12/18/16   Kalman Shan Ratliff, DO  latanoprost (XALATAN) 0.005 % ophthalmic solution Place 1 drop into both eyes at bedtime.    [provider]  loperamide (IMODIUM A-D) 2 MG tablet Take 2 mg by mouth 4 (four) times daily as needed for diarrhea or loose stools.    [provider]  polyethylene glycol (MIRALAX) packet Take 17 g by mouth daily. Patient not taking: Reported on 03/27/2016 02/25/16   Lavina Hamman, MD  Prenatal Vit-Fe Fumarate-FA (PRENATAL VITAMIN PO) Take 1 tablet by mouth daily.     [provider]  tamsulosin (FLOMAX) 0.4 MG CAPS capsule Take 0.4 mg by mouth at bedtime.    [provider]  vitamin B-12 (CYANOCOBALAMIN) 100 MCG tablet Take 100 mcg by mouth daily.    [provider]    Family History No family history on file.  Social History Social History   Tobacco Use  . Smoking status: Never Smoker  . Smokeless tobacco: Never Used  Substance Use Topics  . Alcohol use: No  . Drug use: No     Allergies   Patient has no known allergies.   Review of Systems Review of Systems  All other systems reviewed and are negative.    Physical Exam Updated Vital Signs Ht 1.575 m (5\' 2" )   Wt 54.4 kg   BMI 21.95 kg/m   Physical Exam  Constitutional: He is oriented to person, place, and time. He appears well-developed and well-nourished.  Non-toxic appearance. No distress.  HENT:  Head: Normocephalic and atraumatic.    Eyes: Pupils are equal, round, and reactive to light. Conjunctivae, EOM and lids are normal.  Neck: Normal range of motion. Neck supple. Spinous process tenderness present. No muscular tenderness present. No tracheal deviation and normal range of motion present. No thyroid mass present.  Cardiovascular: Normal rate, regular rhythm and normal heart sounds. Exam reveals no gallop.  No murmur heard. Pulmonary/Chest: Effort normal and breath sounds normal. No stridor. No respiratory distress. He has no decreased breath sounds. He has no wheezes. He has no rhonchi. He has no rales.  Abdominal: Soft. Normal appearance and bowel sounds are normal. He exhibits no distension. There is no tenderness. There is no rebound and no CVA tenderness.  Musculoskeletal: Normal range of motion. He exhibits no edema or tenderness.  Neurological: He is alert and oriented to person, place, and time. He has normal strength. No cranial nerve deficit or sensory deficit. GCS eye subscore is 4. GCS verbal subscore is 5. GCS motor subscore is 6.    Skin: Skin is warm and dry. No abrasion and no rash noted.  Psychiatric: He has a normal mood and affect. His speech is normal and behavior is normal. He is attentive.  Nursing note and vitals reviewed.    ED Treatments / Results  Labs (all labs ordered are listed, but only abnormal results are displayed) Labs Reviewed - No data to display  EKG EKG Interpretation  Date/Time:  Saturday June 11 2018 15:27:53 EDT Ventricular Rate:  69 PR Interval:    QRS Duration: 85 QT Interval:  407 QTC Calculation: 436 R Axis:   1 Text Interpretation:  Sinus rhythm Probable left atrial enlargement Confirmed by Lacretia Leigh ([id number]) on 06/11/2018 5:09:58 PM   Radiology No results found.  Procedures Procedures (including critical care time)  Medications Ordered in ED Medications  0.9 %  sodium chloride  infusion (has no administration in time range)     Initial Impression / Assessment and Plan / ED Course  I have reviewed the triage vital signs and the nursing notes.  Pertinent labs & imaging results that were available during my care of the patient were reviewed by me and considered in my medical decision making (see chart for details).     She is EKG without acute ischemic changes.  No evidence of significant heart block.  Troponin elevated.  Patient does have cervical spine fractures and the case was discussed with Dr. Christella Noa from neurosurgery who recommended patient be placed in [place] collar which is done by nursing and he would like to see the patient in 2 weeks.  Patient is laceration being repaired by resident.  Patient to be admitted for evaluation of syncope and elevated troponin   CRITICAL CARE Performed by: Leota Jacobsen Total critical care time: 50 minutes Critical care time was exclusive of separately billable procedures and treating other patients. Critical care was necessary to treat or prevent imminent or life-threatening deterioration. Critical care was time  spent personally by me on the following activities: development of treatment plan with patient and/or surrogate as well as nursing, discussions with consultants, evaluation of patient's response to treatment, examination of patient, obtaining history from patient or surrogate, ordering and performing treatments and interventions, ordering and review of laboratory studies, ordering and review of radiographic studies, pulse oximetry and re-evaluation of patient's condition.  Final Clinical Impressions(s) / ED Diagnoses   Final diagnoses:  None    ED Discharge Orders    None       Lacretia Leigh, MD 06/11/18 1758

## 2018-06-11 NOTE — Progress Notes (Signed)
RN administered Fentanyl per MD order at 2100, patient with emesis x1 at this time.  Patient continues to complain of nausea.  Patient had 4mg  of Zofran at 1815.  [facility] text paged with this information.

## 2018-06-11 NOTE — Progress Notes (Signed)
Patient complaining of neck pain has morphine ordered.  Per ED RN report patient had emesis post morphine administration.  RN text paged [facility] to see if IV push morphine could be switched with another medication.

## 2018-06-11 NOTE — ED Provider Notes (Signed)
..  Laceration Repair Date/Time: 06/11/2018 7:06 PM Performed by: Keenan Bachelor, MD Authorized by: Keenan Bachelor, MD   Consent:    Consent obtained:  Verbal   Consent given by:  Patient   Risks discussed:  Pain and infection   Alternatives discussed:  No treatment Anesthesia (see MAR for exact dosages):    Anesthesia method:  Local infiltration   Local anesthetic:  Lidocaine 1% w/o epi Laceration details:    Location:  Scalp (nose)   Length (cm):  4 Repair type:    Repair type:  Simple Pre-procedure details:    Preparation:  Patient was prepped and draped in usual sterile fashion Exploration:    Wound exploration: entire depth of wound probed and visualized     Contaminated: no   Treatment:    Wound cleansed with: chlorahexadine.   Amount of cleaning:  Standard   Irrigation solution:  Sterile saline   Irrigation method:  Pressure wash   Visualized foreign bodies/material removed: no   Skin repair:    Repair method:  Sutures   Suture size:  5-0   Suture material:  Prolene   Suture technique:  Simple interrupted   Number of sutures:  8 Approximation:    Approximation:  Close Post-procedure details:    Dressing:  Sterile dressing   Patient tolerance of procedure:  Tolerated well, no immediate complications      Keenan Bachelor, MD 06/11/18 Einar Crow    Lacretia Leigh, MD 06/12/18 2229

## 2018-06-12 DIAGNOSIS — R7989 Other specified abnormal findings of blood chemistry: Secondary | ICD-10-CM

## 2018-06-12 DIAGNOSIS — S129XXA Fracture of neck, unspecified, initial encounter: Secondary | ICD-10-CM

## 2018-06-12 DIAGNOSIS — Y92009 Unspecified place in unspecified non-institutional (private) residence as the place of occurrence of the external cause: Secondary | ICD-10-CM

## 2018-06-12 DIAGNOSIS — W19XXXA Unspecified fall, initial encounter: Secondary | ICD-10-CM

## 2018-06-12 LAB — TROPONIN I
TROPONIN I: 0.06 ng/mL — AB (ref <0.03)
Troponin I: 0.06 ng/mL (ref <0.03)

## 2018-06-12 LAB — BASIC METABOLIC PANEL
Anion gap: 10 (ref 5–15)
BUN: 26 mg/dL — ABNORMAL HIGH (ref 8–23)
CALCIUM: 9.1 mg/dL (ref 8.9–10.3)
CO2: 25 mmol/L (ref 22–32)
Chloride: 105 mmol/L (ref 98–111)
Creatinine, Ser: 1.71 mg/dL — ABNORMAL HIGH (ref 0.61–1.24)
GFR, EST AFRICAN AMERICAN: 39 mL/min — AB (ref >60)
GFR, EST NON AFRICAN AMERICAN: 33 mL/min — AB (ref >60)
Glucose, Bld: 162 mg/dL — ABNORMAL HIGH (ref 70–99)
Potassium: 4.4 mmol/L (ref 3.5–5.1)
SODIUM: 140 mmol/L (ref 135–145)

## 2018-06-12 LAB — MRSA PCR SCREENING: MRSA BY PCR: POSITIVE — AB

## 2018-06-12 LAB — CBC
HCT: 32 % — ABNORMAL LOW (ref 39.0–52.0)
Hemoglobin: 10.6 g/dL — ABNORMAL LOW (ref 13.0–17.0)
MCH: 32.9 pg (ref 26.0–34.0)
MCHC: 33.1 g/dL (ref 30.0–36.0)
MCV: 99.4 fL (ref 80.0–100.0)
NRBC: 0 % (ref 0.0–0.2)
PLATELETS: 138 10*3/uL — AB (ref 150–400)
RBC: 3.22 MIL/uL — ABNORMAL LOW (ref 4.22–5.81)
RDW: 12.4 % (ref 11.5–15.5)
WBC: 7.8 10*3/uL (ref 4.0–10.5)

## 2018-06-12 MED ORDER — MUPIROCIN 2 % EX OINT
1.0000 "application " | TOPICAL_OINTMENT | Freq: Two times a day (BID) | CUTANEOUS | Status: DC
  Administered 2018-06-12 – 2018-06-13 (×3): 1 via NASAL
  Filled 2018-06-12 (×3): qty 22

## 2018-06-12 MED ORDER — FENTANYL CITRATE (PF) 100 MCG/2ML IJ SOLN: 12.5000 ug | INTRAMUSCULAR | Status: DC | PRN

## 2018-06-12 MED ORDER — CHLORHEXIDINE GLUCONATE CLOTH 2 % EX PADS
6.0000 | MEDICATED_PAD | Freq: Every day | CUTANEOUS | Status: DC
  Administered 2018-06-12 – 2018-06-13 (×2): 6 via TOPICAL

## 2018-06-12 NOTE — Progress Notes (Addendum)
Progress Note    Patrick Walker  ZCH:[id number] DOB: 1924/12/09  DOA: 06/11/2018 PCP: Leonard Downing, MD    Brief Narrative:     Medical records reviewed and are as summarized below:  Patrick Walker is an 84 y.o. male who was sitting on the tailgate when he fell asleep and fell onto the ground.  He obtained a cervical fracture as well as an abrasion.  ER doc spoke with NS (Dr. Cyndy Freeze) who recommended aspen collar for 2 weeks and then follow up in his office.  Assessment/Plan:   Active Problems:   Fall at home, initial encounter   Neck fracture [facility])  Fall -patient fell asleep -per family falls asleep in chair and during conversations occasionally  Elevated troponin -flat -no chest pain -no further work up as most likely not clearing from CKD  Cervical C1 multipart mildly displaced fractures through anterior and posterior arches/nondisplaced transverse fracture through the dens.       Neurosurgery:aspen collar for 2 weeks and then follow up in his office -aspen collar needs to be re-positioned -change pain  meds-- d/c fentayl, morphine and use tylenol vs low dose norco  Renal failure; acute on chronic   -improved with IVF -baseline around 1.6  BPH      Flomax  History of A. Fib/flutter      In normal sinus rhythm at this time  Patient not at mental baseline.  He is much more lethargic-normally able to get around on own, hold normal conversations- not sure from fall/possible brain bleed vs medication.  Needs continued neuro checks and IV zofran for nausea.  Not yet taking in much oral food/liquids.  Will hold IVF to avoid volume overload and hold lasix for now but if continues to have low PO intake, may need further IVF.    Family Communication/Anticipated D/C date and plan/Code Status   DVT prophylaxis: heparin Code Status: DNR  Family Communication: at bedside Disposition Plan: home in AM after PT Eval-- has 24 hour care at home  Medical  Consultants:    Neurosurgery (phone)     Subjective:   sleepy  Objective:    Vitals:   06/11/18 1900 06/11/18 2002 06/11/18 2016 06/12/18 0400  BP: (!) 151/65 (!) 156/68  (!) 170/85  Pulse: 79 78  93  Resp: 20 16  19   Temp:  97.8 F (36.6 C)  98.4 F (36.9 C)  TempSrc:  Oral  Oral  SpO2: 95% 95%  97%  Weight:   48.3 kg   Height:   5\' 2"  (1.575 m)     Intake/Output Summary (Last 24 hours) at 06/12/2018 1219 Last data filed at 06/12/2018 0400 Gross per 24 hour  Intake 697.08 ml  Output 660 ml  Net 37.08 ml   Filed Weights   06/11/18 1530 06/11/18 2016  Weight: 54.4 kg 48.3 kg    Exam: In bed, frail appearing rrr No increased work of breathing Falls asleep easily- only says a few words Aspen collar over mouth and not in correct position No LE edema   Data Reviewed:   I have personally reviewed following labs and imaging studies:  Labs: Labs show the following:   Basic Metabolic Panel: Recent Labs  Lab 06/11/18 1622 06/12/18 0736  NA 139 140  K 4.6 4.4  CL 107 105  CO2 23 25  GLUCOSE 151* 162*  BUN 38* 26*  CREATININE 2.06* 1.71*  CALCIUM 9.0 9.1   GFR Estimated Creatinine Clearance: 19.6  mL/min (A) (by C-G formula based on SCr of 1.71 mg/dL (H)). Liver Function Tests: No results for input(s): AST, ALT, ALKPHOS, BILITOT, PROT, ALBUMIN in the last 168 hours. No results for input(s): LIPASE, AMYLASE in the last 168 hours. No results for input(s): AMMONIA in the last 168 hours. Coagulation profile No results for input(s): INR, PROTIME in the last 168 hours.  CBC: Recent Labs  Lab 06/11/18 1622 06/12/18 0736  WBC 4.5 7.8  NEUTROABS 3.2  --   HGB 11.0* 10.6*  HCT 37.1* 32.0*  MCV 104.8* 99.4  PLT 154 138*   Cardiac Enzymes: Recent Labs  Lab 06/11/18 1622 06/11/18 2025 06/12/18 0208 06/12/18 0736  TROPONINI 0.07* 0.07* 0.06* 0.06*   BNP (last 3 results) No results for input(s): PROBNP in the last 8760 hours. CBG: No results  for input(s): GLUCAP in the last 168 hours. D-Dimer: No results for input(s): DDIMER in the last 72 hours. Hgb A1c: No results for input(s): HGBA1C in the last 72 hours. Lipid Profile: No results for input(s): CHOL, HDL, LDLCALC, TRIG, CHOLHDL, LDLDIRECT in the last 72 hours. Thyroid function studies: No results for input(s): TSH, T4TOTAL, T3FREE, THYROIDAB in the last 72 hours.  Invalid input(s): FREET3 Anemia work up: No results for input(s): VITAMINB12, FOLATE, FERRITIN, TIBC, IRON, RETICCTPCT in the last 72 hours. Sepsis Labs: Recent Labs  Lab 06/11/18 1622 06/12/18 0736  WBC 4.5 7.8    Microbiology Recent Results (from the past 240 hour(s))  MRSA PCR Screening     Status: Abnormal   Collection Time: 06/11/18  9:39 PM  Result Value Ref Range Status   MRSA by PCR POSITIVE (A) NEGATIVE Final    Comment: RESULT CALLED TO, READ BACK BY AND VERIFIED WITH: RN JILL T. [id number] @0030  THANEY     Procedures and diagnostic studies:  Ct Head Wo Contrast  Result Date: 06/11/2018 CLINICAL DATA:  Patient status post fall. EXAM: CT HEAD WITHOUT CONTRAST CT CERVICAL SPINE WITHOUT CONTRAST TECHNIQUE: Multidetector CT imaging of the head and cervical spine was performed following the standard protocol without intravenous contrast. Multiplanar CT image reconstructions of the cervical spine were also generated. COMPARISON:  Brain CT 09/23/2017; cervical spine CT 09/22/2017. FINDINGS: CT HEAD FINDINGS Brain: Ventricles and sulci are prominent compatible with atrophy. Periventricular and subcortical white matter hypodensity compatible with chronic microvascular ischemic changes. No evidence for acute cortically based infarct, intracranial hemorrhage, mass lesion or mass-effect. Vascular: Unremarkable. Skull: Slight interval healing of previously described impacted right frontal skull fracture (image 43; series 4). Sinuses/Orbits: Paranasal sinuses are well aerated. Mastoid air cells unremarkable.  Orbits are unremarkable. Other: None. CT CERVICAL SPINE FINDINGS Alignment: Normal anatomic alignment. Skull base and vertebrae: Multipart mildly displaced fractures through the anterior and posterior arches of the C1 vertebral body. There are at least 2 fractures with mild displacement through the posterior arch of C1 and 1 mildly displaced fracture through the right aspect of the anterior arch of C1. Degenerative changes at the level of the dens. Nondisplaced transverse fracture through the mid aspect of the dens (image 28; series 9). Soft tissues and spinal canal: No acute hematoma within the spinal canal. Prevertebral soft tissues unremarkable. Disc levels: Multilevel degenerative disc disease most pronounced C5-6 and C6-7. Upper chest: Unremarkable Other: None. IMPRESSION: 1. Multiple mildly displaced fractures through the C1 ring. There are at least 2 fractures of the posterior arch and 1 fracture through the anterior arch. 2. Nondisplaced fracture through the mid aspect of the dens. 3. No  acute intracranial process. Critical Value/emergent results were called by telephone at the time of interpretation on 06/11/2018 at 5:04 pm to Dr. Lacretia Leigh , who verbally acknowledged these results. Electronically Signed   By: Lovey Newcomer M.D.   On: 06/11/2018 17:12   Ct Cervical Spine Wo Contrast  Result Date: 06/11/2018 CLINICAL DATA:  Patient status post fall. EXAM: CT HEAD WITHOUT CONTRAST CT CERVICAL SPINE WITHOUT CONTRAST TECHNIQUE: Multidetector CT imaging of the head and cervical spine was performed following the standard protocol without intravenous contrast. Multiplanar CT image reconstructions of the cervical spine were also generated. COMPARISON:  Brain CT 09/23/2017; cervical spine CT 09/22/2017. FINDINGS: CT HEAD FINDINGS Brain: Ventricles and sulci are prominent compatible with atrophy. Periventricular and subcortical white matter hypodensity compatible with chronic microvascular ischemic changes. No  evidence for acute cortically based infarct, intracranial hemorrhage, mass lesion or mass-effect. Vascular: Unremarkable. Skull: Slight interval healing of previously described impacted right frontal skull fracture (image 43; series 4). Sinuses/Orbits: Paranasal sinuses are well aerated. Mastoid air cells unremarkable. Orbits are unremarkable. Other: None. CT CERVICAL SPINE FINDINGS Alignment: Normal anatomic alignment. Skull base and vertebrae: Multipart mildly displaced fractures through the anterior and posterior arches of the C1 vertebral body. There are at least 2 fractures with mild displacement through the posterior arch of C1 and 1 mildly displaced fracture through the right aspect of the anterior arch of C1. Degenerative changes at the level of the dens. Nondisplaced transverse fracture through the mid aspect of the dens (image 28; series 9). Soft tissues and spinal canal: No acute hematoma within the spinal canal. Prevertebral soft tissues unremarkable. Disc levels: Multilevel degenerative disc disease most pronounced C5-6 and C6-7. Upper chest: Unremarkable Other: None. IMPRESSION: 1. Multiple mildly displaced fractures through the C1 ring. There are at least 2 fractures of the posterior arch and 1 fracture through the anterior arch. 2. Nondisplaced fracture through the mid aspect of the dens. 3. No acute intracranial process. Critical Value/emergent results were called by telephone at the time of interpretation on 06/11/2018 at 5:04 pm to Dr. Lacretia Leigh , who verbally acknowledged these results. Electronically Signed   By: Lovey Newcomer M.D.   On: 06/11/2018 17:12    Medications:   . Chlorhexidine Gluconate Cloth  6 each Topical Q0600  . diltiazem  240 mg Oral Daily  . feeding supplement (ENSURE ENLIVE)  237 mL Oral TID BM  . heparin  5,000 Units Subcutaneous Q12H  . latanoprost  1 drop Both Eyes QHS  . mupirocin ointment  1 application Nasal BID  . polyethylene glycol  17 g Oral Daily  .  tamsulosin  0.4 mg Oral QHS   Continuous Infusions:   LOS: 1 day   Geradine Girt  [facility]   *Please refer to [email or web address], password [email or web address] to get updated schedule on who will round on this patient, as hospitalists switch teams weekly. If 7PM-7AM, please contact night-coverage at [email or web address], password [email or web address] for any overnight needs.  06/12/2018, 12:19 PM

## 2018-06-12 NOTE — Progress Notes (Signed)
Telemetry order expired. [facility] notified via text/page. [facility] also notified of patient bradycardia heart rate 35-50's.

## 2018-06-13 MED ORDER — HYDROCODONE-ACETAMINOPHEN 5-325 MG PO TABS: 1.0000 | ORAL_TABLET | Freq: Four times a day (QID) | ORAL | 0 refills | Status: AC | PRN

## 2018-06-13 NOTE — Discharge Instructions (Signed)
Cervical Collar A cervical collar is a device that supports your chin and the back of your head. It is used after a severe neck injury to protect your head and neck. It does this by restricting the movement of the top part of your spine, which is located in your neck. A cervical collar may be used when you have:  A fractured neck.  Ligament damage.  A spinal cord injury.  What instructions should I follow?  Wear the collar for as long as your health care provider instructs.  Follow your health care provider's instructions about how to put on and take off your collar.  Do not make your collar so tight that you feel pain or it is hard for you to breathe.  Do not remove the collar unless your health care provider says it is okay. Ask your health care provider if you can remove the collar for showering or eating or to apply ice.  Do not drive a car until your health care provider says it is okay.  Keep all follow-up visits as directed by your health care provider. This is important. Any delay in getting necessary care can keep your injury from healing properly.  Apply ice to the injured area: ? Put ice in a plastic bag. ? Place a towel between your skin and the bag. ? Leave the ice on for 20 minutes, 2-3 times per day for the first 2 days. This information is not intended to replace advice given to you by your health care provider. Make sure you discuss any questions you have with your health care provider. Document Released: 05/09/2004 Document Revised: 12/26/2015 Document Reviewed: 03/26/2014 Elsevier Interactive Patient Education  [employer].

## 2018-06-13 NOTE — Discharge Summary (Signed)
Physician Discharge Summary  Patrick Walker WUJ:[id number] DOB: 08-12-1925 DOA: 06/11/2018  PCP: Leonard Downing, MD  Admit date: 06/11/2018 Discharge date: 06/13/2018  Admitted From: Home Disposition: Home  Recommendations for Outpatient Follow-up:  1. Follow up with PCP in 1-2 weeks 2. Please obtain BMP/CBC in one week 3. Please keep the neck collar on until you see the neurosurgeons as an outpatient.  Home Health: Yes Home health physical therapy Equipment/Devices: Cervical collar  Discharge Condition: Stable CODE STATUS: DO NOT RESUSCITATE Diet recommendation: Heart Healthy  Brief/Interim Summary:  #) Fall comp gated by C1 fracture: Patient was admitted after a fall of his truck after falling asleep.  He did not have any chest pain, palpitations, syncope/presyncope.  Per his family he repeatedly tends to fall asleep while sitting and has had falls before.  He was seen by neurosurgery who recommended placement of a cervical collar and they would follow-up with him in approximately 2 weeks.  Patient was evaluated by physical therapy recommended only home health PT.  Patient was discharged with pain control.  #) Atrial fibrillation with slow ventricular response: She has a history of A. fib with SVR.  Patient continued to be intermittently bradycardic with heart rates in the 40s but asymptomatic.  This primarily occurred when he was sleeping.  He has been off anticoagulation as his PCP took him off anticoagulations sometime ago due to the fall risk.  Patient on diltiazem at home however this was discontinued due to bradycardia.  #) AKI on stage III CKD: Patient was noted to have mild AKI likely secondary to dehydration from diuretics.  Patient was given gentle IV fluids with improvement in AKI.  His furosemide was discontinued on discharge.  #) Elevated troponin: Patient was noted to have elevated troponin that was flat.  This was attributed to his chronic kidney disease.   Patient did not have any active S chest pain or acute ST segment changes.  #) BPH: Patient was continued on home tamsulosin.  #) Lower extremity edema: Patient's furosemide was discontinued as he had mild AKI.  Discharge Diagnoses:  Active Problems:   Fall at home, initial encounter   Neck fracture [facility])    Discharge Instructions  Discharge Instructions    Call MD for:  difficulty breathing, headache or visual disturbances   Complete by:  As directed    Call MD for:  extreme fatigue   Complete by:  As directed    Call MD for:  hives   Complete by:  As directed    Call MD for:  persistant dizziness or light-headedness   Complete by:  As directed    Call MD for:  persistant nausea and vomiting   Complete by:  As directed    Call MD for:  redness, tenderness, or signs of infection (pain, swelling, redness, odor or green/yellow discharge around incision site)   Complete by:  As directed    Call MD for:  severe uncontrolled pain   Complete by:  As directed    Call MD for:  temperature >100.4   Complete by:  As directed    Diet - low sodium heart healthy   Complete by:  As directed    Discharge instructions   Complete by:  As directed    Please follow-up with your primary care doctor in 1 weeks.  Please follow-up with the neurosurgeons as an outpatient.  Please keep the neck collar on until you see the neurosurgeons.   Increase activity slowly  Complete by:  As directed      Allergies as of 06/13/2018      Reactions   Codeine Nausea And Vomiting   Morphine And Related Nausea And Vomiting      Medication List    STOP taking these medications   CARTIA XT 240 MG 24 hr capsule Generic drug:  diltiazem   feeding supplement (ENSURE ENLIVE) Liqd   furosemide 80 MG tablet Commonly known as:  LASIX     TAKE these medications   HYDROcodone-acetaminophen 5-325 MG tablet Commonly known as:  NORCO/VICODIN Take 1 tablet by mouth every 6 (six) hours as needed for up to 5  days for severe pain.   hydrOXYzine 25 MG capsule Commonly known as:  VISTARIL Take 25 mg by mouth at bedtime.   latanoprost 0.005 % ophthalmic solution Commonly known as:  XALATAN Place 1 drop into both eyes at bedtime.   loperamide 2 MG tablet Commonly known as:  IMODIUM A-D Take 2 mg by mouth 4 (four) times daily as needed for diarrhea or loose stools.   polyethylene glycol packet Commonly known as:  MIRALAX / GLYCOLAX Take 17 g by mouth daily.   PRENATAL VITAMIN PO Take 1 tablet by mouth at bedtime.   tamsulosin 0.4 MG Caps capsule Commonly known as:  FLOMAX Take 0.4 mg by mouth at bedtime.   TYLENOL PO Take 1 tablet by mouth every 6 (six) hours as needed (pain/headache).   vitamin B-12 1000 MCG tablet Commonly known as:  CYANOCOBALAMIN Take 1,000 mcg by mouth daily.       Allergies  Allergen Reactions  . Codeine Nausea And Vomiting  . Morphine And Related Nausea And Vomiting    Consultations:  Neurosurgery   Procedures/Studies: Ct Head Wo Contrast  Result Date: 06/11/2018 CLINICAL DATA:  Patient status post fall. EXAM: CT HEAD WITHOUT CONTRAST CT CERVICAL SPINE WITHOUT CONTRAST TECHNIQUE: Multidetector CT imaging of the head and cervical spine was performed following the standard protocol without intravenous contrast. Multiplanar CT image reconstructions of the cervical spine were also generated. COMPARISON:  Brain CT 09/23/2017; cervical spine CT 09/22/2017. FINDINGS: CT HEAD FINDINGS Brain: Ventricles and sulci are prominent compatible with atrophy. Periventricular and subcortical white matter hypodensity compatible with chronic microvascular ischemic changes. No evidence for acute cortically based infarct, intracranial hemorrhage, mass lesion or mass-effect. Vascular: Unremarkable. Skull: Slight interval healing of previously described impacted right frontal skull fracture (image 43; series 4). Sinuses/Orbits: Paranasal sinuses are well aerated. Mastoid air  cells unremarkable. Orbits are unremarkable. Other: None. CT CERVICAL SPINE FINDINGS Alignment: Normal anatomic alignment. Skull base and vertebrae: Multipart mildly displaced fractures through the anterior and posterior arches of the C1 vertebral body. There are at least 2 fractures with mild displacement through the posterior arch of C1 and 1 mildly displaced fracture through the right aspect of the anterior arch of C1. Degenerative changes at the level of the dens. Nondisplaced transverse fracture through the mid aspect of the dens (image 28; series 9). Soft tissues and spinal canal: No acute hematoma within the spinal canal. Prevertebral soft tissues unremarkable. Disc levels: Multilevel degenerative disc disease most pronounced C5-6 and C6-7. Upper chest: Unremarkable Other: None. IMPRESSION: 1. Multiple mildly displaced fractures through the C1 ring. There are at least 2 fractures of the posterior arch and 1 fracture through the anterior arch. 2. Nondisplaced fracture through the mid aspect of the dens. 3. No acute intracranial process. Critical Value/emergent results were called by telephone at the time of interpretation  on 06/11/2018 at 5:04 pm to Dr. Lacretia Leigh , who verbally acknowledged these results. Electronically Signed   By: Lovey Newcomer M.D.   On: 06/11/2018 17:12   Ct Cervical Spine Wo Contrast  Result Date: 06/11/2018 CLINICAL DATA:  Patient status post fall. EXAM: CT HEAD WITHOUT CONTRAST CT CERVICAL SPINE WITHOUT CONTRAST TECHNIQUE: Multidetector CT imaging of the head and cervical spine was performed following the standard protocol without intravenous contrast. Multiplanar CT image reconstructions of the cervical spine were also generated. COMPARISON:  Brain CT 09/23/2017; cervical spine CT 09/22/2017. FINDINGS: CT HEAD FINDINGS Brain: Ventricles and sulci are prominent compatible with atrophy. Periventricular and subcortical white matter hypodensity compatible with chronic microvascular  ischemic changes. No evidence for acute cortically based infarct, intracranial hemorrhage, mass lesion or mass-effect. Vascular: Unremarkable. Skull: Slight interval healing of previously described impacted right frontal skull fracture (image 43; series 4). Sinuses/Orbits: Paranasal sinuses are well aerated. Mastoid air cells unremarkable. Orbits are unremarkable. Other: None. CT CERVICAL SPINE FINDINGS Alignment: Normal anatomic alignment. Skull base and vertebrae: Multipart mildly displaced fractures through the anterior and posterior arches of the C1 vertebral body. There are at least 2 fractures with mild displacement through the posterior arch of C1 and 1 mildly displaced fracture through the right aspect of the anterior arch of C1. Degenerative changes at the level of the dens. Nondisplaced transverse fracture through the mid aspect of the dens (image 28; series 9). Soft tissues and spinal canal: No acute hematoma within the spinal canal. Prevertebral soft tissues unremarkable. Disc levels: Multilevel degenerative disc disease most pronounced C5-6 and C6-7. Upper chest: Unremarkable Other: None. IMPRESSION: 1. Multiple mildly displaced fractures through the C1 ring. There are at least 2 fractures of the posterior arch and 1 fracture through the anterior arch. 2. Nondisplaced fracture through the mid aspect of the dens. 3. No acute intracranial process. Critical Value/emergent results were called by telephone at the time of interpretation on 06/11/2018 at 5:04 pm to Dr. Lacretia Leigh , who verbally acknowledged these results. Electronically Signed   By: Lovey Newcomer M.D.   On: 06/11/2018 17:12     Subjective:   Discharge Exam: Vitals:   06/12/18 2000 06/13/18 0921  BP: (!) 149/68 (!) 143/67  Pulse: 67   Resp:    Temp: 98.4 F (36.9 C)   SpO2: 95%    Vitals:   06/11/18 2016 06/12/18 0400 06/12/18 2000 06/13/18 0921  BP:  (!) 170/85 (!) 149/68 (!) 143/67  Pulse:  93 67   Resp:  19    Temp:   98.4 F (36.9 C) 98.4 F (36.9 C)   TempSrc:  Oral Oral   SpO2:  97% 95%   Weight: 48.3 kg     Height: 5\' 2"  (1.575 m)       General: Pt is alert, awake, not in acute distress Cardiovascular: Bradycardic, irregularly irregular Respiratory: CTA bilaterally, no wheezing, no rhonchi Abdominal: Soft, NT, ND, bowel sounds + Extremities: no edema Neuro: Bilateral lower extremity strength is 5 out of 5, bilateral upper extremity strength is 5 out of 5, intact distal sensation throughout, neck in cervical collar, some midline tenderness    The results of significant diagnostics from this hospitalization (including imaging, microbiology, ancillary and laboratory) are listed below for reference.     Microbiology: Recent Results (from the past 240 hour(s))  MRSA PCR Screening     Status: Abnormal   Collection Time: 06/11/18  9:39 PM  Result Value Ref Range Status  MRSA by PCR POSITIVE (A) NEGATIVE Final    Comment: RESULT CALLED TO, READ BACK BY AND VERIFIED WITH: RN JILL T. [id number] @0030  THANEY      Labs: BNP (last 3 results) No results for input(s): BNP in the last 8760 hours. Basic Metabolic Panel: Recent Labs  Lab 06/11/18 1622 06/12/18 0736  NA 139 140  K 4.6 4.4  CL 107 105  CO2 23 25  GLUCOSE 151* 162*  BUN 38* 26*  CREATININE 2.06* 1.71*  CALCIUM 9.0 9.1   Liver Function Tests: No results for input(s): AST, ALT, ALKPHOS, BILITOT, PROT, ALBUMIN in the last 168 hours. No results for input(s): LIPASE, AMYLASE in the last 168 hours. No results for input(s): AMMONIA in the last 168 hours. CBC: Recent Labs  Lab 06/11/18 1622 06/12/18 0736  WBC 4.5 7.8  NEUTROABS 3.2  --   HGB 11.0* 10.6*  HCT 37.1* 32.0*  MCV 104.8* 99.4  PLT 154 138*   Cardiac Enzymes: Recent Labs  Lab 06/11/18 1622 06/11/18 2025 06/12/18 0208 06/12/18 0736  TROPONINI 0.07* 0.07* 0.06* 0.06*   BNP: Invalid input(s): POCBNP CBG: No results for input(s): GLUCAP in the last 168  hours. D-Dimer No results for input(s): DDIMER in the last 72 hours. Hgb A1c No results for input(s): HGBA1C in the last 72 hours. Lipid Profile No results for input(s): CHOL, HDL, LDLCALC, TRIG, CHOLHDL, LDLDIRECT in the last 72 hours. Thyroid function studies No results for input(s): TSH, T4TOTAL, T3FREE, THYROIDAB in the last 72 hours.  Invalid input(s): FREET3 Anemia work up No results for input(s): VITAMINB12, FOLATE, FERRITIN, TIBC, IRON, RETICCTPCT in the last 72 hours. Urinalysis    Component Value Date/Time   COLORURINE YELLOW 12/17/2016 1156   APPEARANCEUR CLEAR 12/17/2016 1156   LABSPEC 1.018 12/17/2016 1156   PHURINE 5.0 12/17/2016 1156   GLUCOSEU 50 (A) 12/17/2016 1156   HGBUR SMALL (A) 12/17/2016 1156   BILIRUBINUR NEGATIVE 12/17/2016 1156   KETONESUR NEGATIVE 12/17/2016 1156   PROTEINUR 100 (A) 12/17/2016 1156   NITRITE NEGATIVE 12/17/2016 1156   LEUKOCYTESUR NEGATIVE 12/17/2016 1156   Sepsis Labs Invalid input(s): PROCALCITONIN,  WBC,  LACTICIDVEN Microbiology Recent Results (from the past 240 hour(s))  MRSA PCR Screening     Status: Abnormal   Collection Time: 06/11/18  9:39 PM  Result Value Ref Range Status   MRSA by PCR POSITIVE (A) NEGATIVE Final    Comment: RESULT CALLED TO, READ BACK BY AND VERIFIED WITH: RN JILL T. [id number] @0030  THANEY      Time coordinating discharge: Over 30 minutes  SIGNED:   Cristy Folks, MD  [facility] 06/13/2018, 1:35 PM    If 7PM-7AM, please contact night-coverage [email or web address] Password [email or web address]

## 2018-06-13 NOTE — Evaluation (Addendum)
Physical Therapy Evaluation Patient Details Name: Patrick Walker MRN: [id number] DOB: October 04, 1924 Today's Date: 06/13/2018   History of Present Illness  Patient is a 84 y/o male who presents s/p syncopal episode after falling out of truck. Found to have Multiple mildly displaced fractures through the C1 ring and Nondisplaced fracture through the mid aspect of the dens. Fx being managed conservatively per neurosurgery. PMH includes glaucoma, CKD, A-fib, sinus bradycardia, aortic insufficiency.   Clinical Impression  Patient presents with generalized weakness, lethargy, confusion, impaired balance and impaired mobility s/p above. Pt Mod I PTA taking care of self and driving. Today, pt tolerated standing and short distance ambulation with Min guard assist for balance/safety. Adjusted cervical collar for better fit. Per MD notes, pt has 24/7 supervision at home. Recommend use of RW for safety. HR ranged from 50-80s bpm and Sp02 remained >92% on RA during mobility. Would benefit from HHPT for safety evaluation and to improve overall mobility, strength and safety at home. Will follow acutely.    Follow Up Recommendations Home health PT;Supervision for mobility/OOB    Equipment Recommendations  None recommended by PT    Recommendations for Other Services OT consult     Precautions / Restrictions Precautions Precautions: Cervical;Fall Precaution Booklet Issued: No Required Braces or Orthoses: Cervical Brace Cervical Brace: Hard collar;At all times Restrictions Weight Bearing Restrictions: No      Mobility  Bed Mobility Overal bed mobility: Needs Assistance Bed Mobility: Rolling;Sidelying to Sit Rolling: Min assist Sidelying to sit: HOB elevated;Mod assist       General bed mobility comments: Step by step cues for log roll technique, assist to roll and elevate to upright. + dizziness.   Transfers Overall transfer level: Needs assistance Equipment used: Rolling walker (2  wheeled) Transfers: Sit to/from Stand Sit to Stand: Min guard         General transfer comment: Min guard for safety, pulling up on RW. Transferred to chair post ambulation bout.  Ambulation/Gait Ambulation/Gait assistance: Min guard Gait Distance (Feet): 25 Feet Assistive device: Rolling walker (2 wheeled) Gait Pattern/deviations: Step-through pattern;Decreased stride length;Narrow base of support Gait velocity: decreased   General Gait Details: Slow, mildly unsteady gait with RW for support. + nausea and dizziness post ambulation. RN notified. HR ranged from 50s-80s bpm. Sp02 >92% on RA.   Stairs            Wheelchair Mobility    Modified Rankin (Stroke Patients Only) Modified Rankin (Stroke Patients Only) Pre-Morbid Rankin Score: Moderate disability Modified Rankin: Moderately severe disability     Balance Overall balance assessment: Needs assistance Sitting-balance support: Feet supported;Single extremity supported Sitting balance-Leahy Scale: Fair     Standing balance support: During functional activity;Bilateral upper extremity supported Standing balance-Leahy Scale: Poor Standing balance comment: Requires BUE support in standing.                             Pertinent Vitals/Pain Pain Assessment: Faces Faces Pain Scale: No hurt    Home Living Family/patient expects to be discharged to:: Private residence Living Arrangements: Alone Available Help at Discharge: Family;Available 24 hours/day Type of Home: House Home Access: Stairs to enter Entrance Stairs-Rails: Right Entrance Stairs-Number of Steps: 5 Home Layout: Two level;Able to live on main level with bedroom/bathroom Home Equipment: Gilford Rile - 2 wheels;Cane - single point Additional Comments: history taken from prior admission as pt having a hard time responding to questions this AM.  Prior Function Level of Independence: Independent with assistive device(s)         Comments:  ocassionally used RW per pt report, reports independent with ADLs, drives. Does not do much cooking/cleaning. Wears 02 at times per report.     Hand Dominance   Dominant Hand: Right    Extremity/Trunk Assessment   Upper Extremity Assessment Upper Extremity Assessment: Defer to OT evaluation    Lower Extremity Assessment Lower Extremity Assessment: Generalized weakness(but functional.)    Cervical / Trunk Assessment Cervical / Trunk Assessment: Other exceptions Cervical / Trunk Exceptions: cervical collar adjusted.   Communication   Communication: HOH  Cognition Arousal/Alertness: Lethargic Behavior During Therapy: WFL for tasks assessed/performed Overall Cognitive Status: Impaired/Different from baseline Area of Impairment: Orientation;Memory;Attention;Following commands;Problem solving                 Orientation Level: Disoriented to;Place;Situation Current Attention Level: Focused;Sustained Memory: Decreased short-term memory Following Commands: Follows one step commands with increased time     Problem Solving: Slow processing;Decreased initiation;Requires verbal cues General Comments: Initially thought he was at home, knew it was "October year 19". able to state what happened when cued, prompted. Initially sustained attention digressing to focused due to given zofran. Very slow processing. Difficulty getting words out at times.       General Comments General comments (skin integrity, edema, etc.): NO family members present so difficult to get accurate assessment. Very polite and cooperative. Initially trying to get off mitts and mess with lines but this improved during session.    Exercises     Assessment/Plan    PT Assessment Patient needs continued PT services  PT Problem List Decreased strength;Decreased mobility;Decreased safety awareness;Decreased knowledge of precautions;Decreased cognition;Cardiopulmonary status limiting activity;Decreased skin  integrity;Decreased balance       PT Treatment Interventions Functional mobility training;Balance training;Patient/family education;Gait training;Therapeutic activities;Stair training;Therapeutic exercise;Cognitive remediation;Neuromuscular re-education;DME instruction    PT Goals (Current goals can be found in the Care Plan section)  Acute Rehab PT Goals Patient Stated Goal: go home PT Goal Formulation: With patient Time For Goal Achievement: 06/27/18 Potential to Achieve Goals: Fair    Frequency Min 3X/week   Barriers to discharge   per MD notes, can provide 24/7.    Co-evaluation               AM-PAC PT "6 Clicks" Daily Activity  Outcome Measure Difficulty turning over in bed (including adjusting bedclothes, sheets and blankets)?: Unable Difficulty moving from lying on back to sitting on the side of the bed? : Unable Difficulty sitting down on and standing up from a chair with arms (e.g., wheelchair, bedside commode, etc,.)?: A Little Help needed moving to and from a bed to chair (including a wheelchair)?: A Little Help needed walking in hospital room?: A Little Help needed climbing 3-5 steps with a railing? : A Lot 6 Click Score: 13    End of Session Equipment Utilized During Treatment: Gait belt;Cervical collar Activity Tolerance: Patient tolerated treatment well;Patient limited by lethargy Patient left: in chair;with call bell/phone within reach;with chair alarm set Nurse Communication: Mobility status PT Visit Diagnosis: Unsteadiness on feet (R26.81);Difficulty in walking, not elsewhere classified (R26.2);Muscle weakness (generalized) (M62.81)    Time: 6010-9323 PT Time Calculation (min) (ACUTE ONLY): 29 min   Charges:   PT Evaluation $PT Eval Moderate Complexity: 1 Mod PT Treatments $Therapeutic Activity: 8-22 mins        Wray Kearns, PT, DPT Acute Rehabilitation Services Pager [phone] Office [phone]  Marguarite Arbour A  Thuan Tippett 06/13/2018, 9:00 AM

## 2018-06-13 NOTE — Care Management Note (Signed)
Case Management Note  Patient Details  Name: Patrick Walker MRN: [id number] Date of Birth: 1925-05-17  Subjective/Objective:  Pt presented for a fall. PTA from home with support of sitters 24/7.              Action/Plan: Pt has utilized [facility] in the past and wants to utilize again. Referral made to [facility] and [facility] to begin within 24-48 hours post transition home. No further needs from CM at this time.   Expected Discharge Date:  06/13/18               Expected Discharge Plan:  [place]  In-House Referral:  NA  Discharge planning Services  CM Consult  Post Acute Care Choice:  Home Health Choice offered to:  Patient  DME Arranged:  N/A DME Agency:  NA  HH Arranged:  PT HH Agency:  [facility]  Status of Service:  Completed, signed off  If discussed at [place] of Stay Meetings, dates discussed:    Additional Comments:  Bethena Roys, RN 06/13/2018, 1:36 PM

## 2018-06-13 NOTE — Progress Notes (Signed)
Patient and family received discharge information and acknowledged understanding of it. Patient family received printed prescription. Patient IV was removed.

## 2018-06-29 ENCOUNTER — Emergency Department (HOSPITAL_COMMUNITY): Payer: Medicare Other

## 2018-06-29 ENCOUNTER — Other Ambulatory Visit: Payer: Self-pay

## 2018-06-29 ENCOUNTER — Encounter (HOSPITAL_COMMUNITY): Payer: Self-pay | Admitting: *Deleted

## 2018-06-29 ENCOUNTER — Inpatient Hospital Stay (HOSPITAL_COMMUNITY)
Admission: EM | Admit: 2018-06-29 | Discharge: 2018-07-05 | DRG: 391 | Disposition: A | Discharge status: Skilled Nursing Facility | Payer: Medicare Other | Attending: Internal Medicine | Admitting: Internal Medicine

## 2018-06-29 DIAGNOSIS — I4891 Unspecified atrial fibrillation: Secondary | ICD-10-CM

## 2018-06-29 DIAGNOSIS — G309 Alzheimer's disease, unspecified: Secondary | ICD-10-CM | POA: Diagnosis present

## 2018-06-29 DIAGNOSIS — Z885 Allergy status to narcotic agent status: Secondary | ICD-10-CM

## 2018-06-29 DIAGNOSIS — N183 Chronic kidney disease, stage 3 unspecified: Secondary | ICD-10-CM

## 2018-06-29 DIAGNOSIS — D631 Anemia in chronic kidney disease: Secondary | ICD-10-CM | POA: Diagnosis present

## 2018-06-29 DIAGNOSIS — E279 Disorder of adrenal gland, unspecified: Secondary | ICD-10-CM

## 2018-06-29 DIAGNOSIS — I4892 Unspecified atrial flutter: Secondary | ICD-10-CM | POA: Diagnosis present

## 2018-06-29 DIAGNOSIS — D3502 Benign neoplasm of left adrenal gland: Secondary | ICD-10-CM | POA: Diagnosis present

## 2018-06-29 DIAGNOSIS — E860 Dehydration: Secondary | ICD-10-CM | POA: Diagnosis present

## 2018-06-29 DIAGNOSIS — A084 Viral intestinal infection, unspecified: Principal | ICD-10-CM | POA: Diagnosis present

## 2018-06-29 DIAGNOSIS — H409 Unspecified glaucoma: Secondary | ICD-10-CM | POA: Diagnosis present

## 2018-06-29 DIAGNOSIS — G4700 Insomnia, unspecified: Secondary | ICD-10-CM

## 2018-06-29 DIAGNOSIS — Z961 Presence of intraocular lens: Secondary | ICD-10-CM | POA: Diagnosis present

## 2018-06-29 DIAGNOSIS — Z79899 Other long term (current) drug therapy: Secondary | ICD-10-CM

## 2018-06-29 DIAGNOSIS — S0291XA Unspecified fracture of skull, initial encounter for closed fracture: Secondary | ICD-10-CM | POA: Diagnosis present

## 2018-06-29 DIAGNOSIS — S129XXA Fracture of neck, unspecified, initial encounter: Secondary | ICD-10-CM | POA: Diagnosis present

## 2018-06-29 DIAGNOSIS — H9190 Unspecified hearing loss, unspecified ear: Secondary | ICD-10-CM | POA: Diagnosis present

## 2018-06-29 DIAGNOSIS — R112 Nausea with vomiting, unspecified: Secondary | ICD-10-CM | POA: Diagnosis not present

## 2018-06-29 DIAGNOSIS — F0281 Dementia in other diseases classified elsewhere with behavioral disturbance: Secondary | ICD-10-CM | POA: Diagnosis present

## 2018-06-29 DIAGNOSIS — R5381 Other malaise: Secondary | ICD-10-CM

## 2018-06-29 DIAGNOSIS — K589 Irritable bowel syndrome without diarrhea: Secondary | ICD-10-CM | POA: Diagnosis present

## 2018-06-29 DIAGNOSIS — R509 Fever, unspecified: Secondary | ICD-10-CM

## 2018-06-29 DIAGNOSIS — M1990 Unspecified osteoarthritis, unspecified site: Secondary | ICD-10-CM | POA: Diagnosis present

## 2018-06-29 DIAGNOSIS — Q676 Pectus excavatum: Secondary | ICD-10-CM

## 2018-06-29 DIAGNOSIS — D649 Anemia, unspecified: Secondary | ICD-10-CM

## 2018-06-29 DIAGNOSIS — I351 Nonrheumatic aortic (valve) insufficiency: Secondary | ICD-10-CM | POA: Diagnosis present

## 2018-06-29 DIAGNOSIS — R627 Adult failure to thrive: Secondary | ICD-10-CM | POA: Diagnosis present

## 2018-06-29 DIAGNOSIS — R296 Repeated falls: Secondary | ICD-10-CM | POA: Diagnosis present

## 2018-06-29 DIAGNOSIS — Z9841 Cataract extraction status, right eye: Secondary | ICD-10-CM

## 2018-06-29 DIAGNOSIS — Z9842 Cataract extraction status, left eye: Secondary | ICD-10-CM

## 2018-06-29 DIAGNOSIS — E278 Other specified disorders of adrenal gland: Secondary | ICD-10-CM

## 2018-06-29 DIAGNOSIS — Z9181 History of falling: Secondary | ICD-10-CM

## 2018-06-29 DIAGNOSIS — R7989 Other specified abnormal findings of blood chemistry: Secondary | ICD-10-CM | POA: Diagnosis present

## 2018-06-29 DIAGNOSIS — Z66 Do not resuscitate: Secondary | ICD-10-CM | POA: Diagnosis present

## 2018-06-29 DIAGNOSIS — K5730 Diverticulosis of large intestine without perforation or abscess without bleeding: Secondary | ICD-10-CM | POA: Diagnosis present

## 2018-06-29 DIAGNOSIS — F05 Delirium due to known physiological condition: Secondary | ICD-10-CM | POA: Diagnosis present

## 2018-06-29 DIAGNOSIS — E43 Unspecified severe protein-calorie malnutrition: Secondary | ICD-10-CM

## 2018-06-29 DIAGNOSIS — N400 Enlarged prostate without lower urinary tract symptoms: Secondary | ICD-10-CM

## 2018-06-29 LAB — COMPREHENSIVE METABOLIC PANEL
ALK PHOS: 60 U/L (ref 38–126)
ALT: 13 U/L (ref 0–44)
AST: 29 U/L (ref 15–41)
Albumin: 3.8 g/dL (ref 3.5–5.0)
Anion gap: 9 (ref 5–15)
BUN: 42 mg/dL — AB (ref 8–23)
CALCIUM: 9.6 mg/dL (ref 8.9–10.3)
CHLORIDE: 98 mmol/L (ref 98–111)
CO2: 29 mmol/L (ref 22–32)
CREATININE: 1.75 mg/dL — AB (ref 0.61–1.24)
GFR calc Af Amer: 37 mL/min — ABNORMAL LOW (ref >60)
GFR calc non Af Amer: 32 mL/min — ABNORMAL LOW (ref >60)
GLUCOSE: 103 mg/dL — AB (ref 70–99)
Potassium: 4.7 mmol/L (ref 3.5–5.1)
SODIUM: 136 mmol/L (ref 135–145)
Total Bilirubin: 0.8 mg/dL (ref 0.3–1.2)
Total Protein: 7.8 g/dL (ref 6.5–8.1)

## 2018-06-29 LAB — URINALYSIS, ROUTINE W REFLEX MICROSCOPIC
BILIRUBIN URINE: NEGATIVE
Bacteria, UA: NONE SEEN
Glucose, UA: NEGATIVE mg/dL
HGB URINE DIPSTICK: NEGATIVE
Ketones, ur: 5 mg/dL — AB
LEUKOCYTES UA: NEGATIVE
NITRITE: NEGATIVE
PROTEIN: 100 mg/dL — AB
Specific Gravity, Urine: 1.014 (ref 1.005–1.030)
pH: 6 (ref 5.0–8.0)

## 2018-06-29 LAB — CBC WITH DIFFERENTIAL/PLATELET
Abs Immature Granulocytes: 0.1 10*3/uL — ABNORMAL HIGH (ref 0.00–0.07)
Basophils Absolute: 0 10*3/uL (ref 0.0–0.1)
Basophils Relative: 0 %
EOS PCT: 0 %
Eosinophils Absolute: 0 10*3/uL (ref 0.0–0.5)
HCT: 35.9 % — ABNORMAL LOW (ref 39.0–52.0)
Hemoglobin: 11.8 g/dL — ABNORMAL LOW (ref 13.0–17.0)
Immature Granulocytes: 2 %
LYMPHS ABS: 0.5 10*3/uL — AB (ref 0.7–4.0)
LYMPHS PCT: 8 %
MCH: 31.8 pg (ref 26.0–34.0)
MCHC: 32.9 g/dL (ref 30.0–36.0)
MCV: 96.8 fL (ref 80.0–100.0)
MONO ABS: 0.7 10*3/uL (ref 0.1–1.0)
MONOS PCT: 11 %
NEUTROS ABS: 4.7 10*3/uL (ref 1.7–7.7)
Neutrophils Relative %: 79 %
Platelets: 268 10*3/uL (ref 150–400)
RBC: 3.71 MIL/uL — ABNORMAL LOW (ref 4.22–5.81)
RDW: 11.8 % (ref 11.5–15.5)
WBC: 6 10*3/uL (ref 4.0–10.5)
nRBC: 0 % (ref 0.0–0.2)

## 2018-06-29 LAB — LIPASE, BLOOD: Lipase: 29 U/L (ref 11–51)

## 2018-06-29 MED ORDER — SODIUM CHLORIDE 0.9 % IV BOLUS
1000.0000 mL | Freq: Once | INTRAVENOUS | Status: AC
  Administered 2018-06-29: 1000 mL via INTRAVENOUS

## 2018-06-29 MED ORDER — VITAMIN B-12 1000 MCG PO TABS
1000.0000 ug | ORAL_TABLET | Freq: Every day | ORAL | Status: DC
  Administered 2018-06-30 – 2018-07-05 (×5): 1000 ug via ORAL
  Filled 2018-06-29 (×5): qty 1

## 2018-06-29 MED ORDER — HYDROXYZINE HCL 25 MG PO TABS
25.0000 mg | ORAL_TABLET | Freq: Every day | ORAL | Status: DC
  Administered 2018-06-29 – 2018-07-04 (×5): 25 mg via ORAL
  Filled 2018-06-29 (×6): qty 1

## 2018-06-29 MED ORDER — SODIUM CHLORIDE 0.9 % IV SOLN
INTRAVENOUS | Status: AC
  Administered 2018-06-29: 23:00:00 via INTRAVENOUS
  Administered 2018-06-29: 100 mL/h via INTRAVENOUS

## 2018-06-29 MED ORDER — ONDANSETRON HCL 4 MG/2ML IJ SOLN
4.0000 mg | Freq: Once | INTRAMUSCULAR | Status: AC
  Administered 2018-06-29: 4 mg via INTRAVENOUS
  Filled 2018-06-29: qty 2

## 2018-06-29 MED ORDER — ACETAMINOPHEN 325 MG PO TABS
325.0000 mg | ORAL_TABLET | Freq: Four times a day (QID) | ORAL | Status: DC | PRN
  Administered 2018-06-29 – 2018-07-05 (×4): 325 mg via ORAL
  Filled 2018-06-29 (×4): qty 1

## 2018-06-29 MED ORDER — ONDANSETRON HCL 4 MG/2ML IJ SOLN: 4.0000 mg | Freq: Four times a day (QID) | INTRAMUSCULAR | Status: DC | PRN

## 2018-06-29 MED ORDER — LATANOPROST 0.005 % OP SOLN
1.0000 [drp] | Freq: Every day | OPHTHALMIC | Status: DC
  Administered 2018-06-29 – 2018-07-04 (×4): 1 [drp] via OPHTHALMIC
  Filled 2018-06-29: qty 2.5

## 2018-06-29 MED ORDER — TAMSULOSIN HCL 0.4 MG PO CAPS
0.4000 mg | ORAL_CAPSULE | Freq: Every day | ORAL | Status: DC
  Administered 2018-06-29 – 2018-07-04 (×4): 0.4 mg via ORAL
  Filled 2018-06-29 (×5): qty 1

## 2018-06-29 MED ORDER — ENOXAPARIN SODIUM 30 MG/0.3ML ~~LOC~~ SOLN
30.0000 mg | SUBCUTANEOUS | Status: DC
  Administered 2018-06-29 – 2018-07-04 (×4): 30 mg via SUBCUTANEOUS
  Filled 2018-06-29 (×6): qty 0.3

## 2018-06-29 MED ORDER — LOPERAMIDE HCL 2 MG PO CAPS: 2.0000 mg | ORAL_CAPSULE | Freq: Four times a day (QID) | ORAL | Status: DC | PRN

## 2018-06-29 NOTE — ED Provider Notes (Signed)
Emergency Department Provider Note   I have reviewed the triage vital signs and the nursing notes.   HISTORY  Chief Complaint Emesis   HPI Patrick Walker is a 84 y.o. male with PMH of AS, PAF, CKD, Arthritis, and known cervical fracture with Aspen collar in place presents to the emergency department from home with nausea vomiting and generalized weakness.  She is accompanied by his son who provides the history.  Patient has significant dementia limiting his HPI and review of systems.  Level 5 caveat applies.  The patient's son states that he has been on a progressive decline but his generalized weakness has worsened in the past 2 days in the setting of nonbloody emesis.  Patient did have some diarrhea 2 days ago and was given Imodium which alleviated this.  He does have history of diarrhea per the son and this has not been worse than normal recently.  Son denies any fevers or chills.  Patient has not been complaining of abdominal pain to family.  The patient has not been eating.  Yesterday he ate a couple bites of sandwich for lunch but that was all. He does drink fluids occasionally. Patient's collar is loose. Son states that they re-adjust frequently but the patient will pull and loosen it almost immediately.   Level 5 caveat: Dementia   Past Medical History:  Diagnosis Date  . Aortic insufficiency    Mild, echo, November, 2011  . Aortic valve sclerosis    Echo, November, 2011  . Arthritis    "joints probably" (09/23/2017)  . Atrial fibrillation ([place])   . Atrial flutter [facility])    New diagnosis November, 2012  . CKD (chronic kidney disease), stage III ([place])    [name] 09/23/2017  . Difficulty in urination   . Ejection fraction    EF 55-60%, echo, November, 2012 ( rapid atrial flutter at that time)  . Fall from standing 09/22/2017   sustained a open depressed frontal skull fracture with some pneumocephalus. [name] 09/23/2017  . Glaucoma   . Hearing loss   . Irritable bowel disease    . Sinus bradycardia     Patient Active Problem List   Diagnosis Date Noted  . Neck fracture ([place]) 06/12/2018  . Acute coronary syndrome ([place]) 06/11/2018  . Skull fracture ([place]) 09/22/2017  . Pneumocephalus, traumatic 09/22/2017  . Fall at home, initial encounter 09/22/2017  . Intertrochanteric fracture of right femur ([place]) 12/15/2016  . Malnutrition of moderate degree 02/24/2016  . Acute kidney injury ([place]) 02/23/2016  . Diverticulitis 02/23/2016  . Dehydration 02/23/2016  . Constipation 02/23/2016  . Nausea 02/23/2016  . Generalized weakness 02/23/2016  . Sinus bradycardia   . Faustina Gebert term current use of anticoagulant 08/04/2011  . Aortic valve sclerosis   . Aortic insufficiency   . Atrial flutter ([place])   . TSH (thyroid-stimulating hormone deficiency)   . Hearing loss   . Shortness of breath   . Inguinal hernia - left s/p lap [facility] repair [id number] 03/16/2011    Past Surgical History:  Procedure Laterality Date  . BACK SURGERY  2008-2009  . CATARACT EXTRACTION W/ INTRAOCULAR LENS  IMPLANT, BILATERAL Bilateral   . FRACTURE SURGERY    . HAND SURGERY Right    "chainsaw about cut it off"  . INGUINAL HERNIA REPAIR  03/02/2011  . INTRAMEDULLARY (IM) NAIL INTERTROCHANTERIC Right 12/15/2016   Procedure: INTRAMEDULLARY (IM) NAIL INTERTROCHANTRIC;  Surgeon: Renette Butters, MD;  Location: [facility];  Service: Orthopedics;  Laterality: Right;  .  LEG SURGERY Left 1972   "crushed"    Allergies Codeine and Morphine and related  No family history on file.  Social History Social History   Tobacco Use  . Smoking status: Never Smoker  . Smokeless tobacco: Never Used  Substance Use Topics  . Alcohol use: No  . Drug use: No    Review of Systems  Level 5 caveat: Dementia  ____________________________________________   PHYSICAL EXAM:  VITAL SIGNS: ED Triage Vitals [06/29/18 1020]  Enc Vitals Group     BP 139/76     Pulse Rate 87     Resp 16     Temp 98.2 F (36.8 C)      Temp Source Oral     SpO2 100 %   Constitutional: Alert with baseline confusion. Chronically ill-appearing. Patient is very thin.  Eyes: Conjunctivae are normal. Head: Atraumatic. Nose: No congestion/rhinnorhea. Mouth/Throat: Mucous membranes are dry.  Neck: No stridor. Poorly-fitting Aspen collar in place.  Cardiovascular: Normal rate, regular rhythm. Good peripheral circulation. Grossly normal heart sounds.   Respiratory: Normal respiratory effort.  No retractions. Lungs CTAB. Gastrointestinal: Soft and nontender. No distention.  Musculoskeletal: No lower extremity tenderness nor edema. No gross deformities of extremities. Neurologic: No gross focal neurologic deficits are appreciated.  Skin:  Skin is warm, dry and intact. No rash noted.  ____________________________________________   LABS (all labs ordered are listed, but only abnormal results are displayed)  Labs Reviewed  COMPREHENSIVE METABOLIC PANEL - Abnormal; Notable for the following components:      Result Value   Glucose, Bld 103 (*)    BUN 42 (*)    Creatinine, Ser 1.75 (*)    GFR calc non Af Amer 32 (*)    GFR calc Af Amer 37 (*)    All other components within normal limits  CBC WITH DIFFERENTIAL/PLATELET - Abnormal; Notable for the following components:   RBC 3.71 (*)    Hemoglobin 11.8 (*)    HCT 35.9 (*)    Lymphs Abs 0.5 (*)    Abs Immature Granulocytes 0.10 (*)    All other components within normal limits  URINALYSIS, ROUTINE W REFLEX MICROSCOPIC - Abnormal; Notable for the following components:   Ketones, ur 5 (*)    Protein, ur 100 (*)    All other components within normal limits  URINE CULTURE  LIPASE, BLOOD   ____________________________________________  EKG   EKG Interpretation  Date/Time:  Wednesday June 29 2018 10:20:31 EDT Ventricular Rate:  86 PR Interval:    QRS Duration: 84 QT Interval:  382 QTC Calculation: 457 R Axis:   -34 Text Interpretation:  Sinus rhythm Probable left  atrial enlargement Left axis deviation Baseline wander in lead(s) V3 No STEMI  Confirmed by Nanda Quinton [phone]) on 06/29/2018 10:30:58 AM Also confirmed by Nanda Quinton [phone]), editor Hattie Perch ([id number])  on 06/29/2018 12:22:39 PM       ____________________________________________  RADIOLOGY  Ct Abdomen Pelvis Wo Contrast  Result Date: 06/29/2018 CLINICAL DATA:  Nausea and vomiting EXAM: CT ABDOMEN AND PELVIS WITHOUT CONTRAST TECHNIQUE: Multidetector CT imaging of the abdomen and pelvis was performed following the standard protocol without IV contrast. COMPARISON:  03/26/2016 FINDINGS: Lower chest: Pectus excavatum configuration with cardiomegaly. Aortic atherosclerosis is noted. No significant coronary arteriosclerosis of the included heart. No pericardial effusion or thickening. Chronic age related interstitial change of the lungs with atelectasis at each lung base. Calcified pleural density in the right middle lobe compatible with calcified plaque. Hepatobiliary: The unenhanced  liver is unremarkable. Physiologic distention of the gallbladder without stones. No pericholecystic fluid or secondary signs of acute cholecystitis. Pancreas: Normal Spleen: Normal Adrenals/Urinary Tract: Hypodense left adrenal nodule measuring 3.4 x 2.2 cm on series 2/18 is minimally larger but without other significant change. Right adrenal gland is normal. Simple appearing bilateral renal cysts. No nephrolithiasis nor obstructive uropathy. The urinary bladder is unremarkable. Stomach/Bowel: Nonobstructed, nondistended bowel loops. Stomach is unremarkable. Scattered colonic diverticulosis without acute diverticulitis along the descending and sigmoid colon. Additional scattered right-sided colonic diverticula are present without inflammatory change. No acute inflammatory process identified. Appendix not well visualized and may be surgically absent. Vascular/Lymphatic: Mild to moderate aortoiliac atherosclerosis  without aneurysm. No adenopathy. Reproductive: Prostate is top-normal size. Other: No free air or free fluid. Musculoskeletal: Right femoral nail fixation hardware is intact. Degenerative disc and lumbar facet arthropathy. Prior L1 kyphoplasty. No aggressive osseous lesions or acute fracture. Degenerative change along the dorsal spine, both hips, sacroiliac joints and pubic symphysis. IMPRESSION: 1. Colonic diverticulosis without acute diverticulitis or bowel obstruction. 2. Minimally larger hypodense left adrenal nodule compatible with an adenoma measuring approximately 3.4 x 2.2 cm currently. 3. Calcified pleural plaque overlying the right middle lobe. 4. Pectus excavatum stable cardiomegaly. Electronically Signed   By: Ashley Royalty M.D.   On: 06/29/2018 14:18   Ct Head Wo Contrast  Result Date: 06/29/2018 CLINICAL DATA:  Dementia EXAM: CT HEAD WITHOUT CONTRAST TECHNIQUE: Contiguous axial images were obtained from the base of the skull through the vertex without intravenous contrast. COMPARISON:  06/11/2018 FINDINGS: BRAIN: There is sulcal and ventricular prominence consistent with superficial and central atrophy. No intraparenchymal hemorrhage, mass effect nor midline shift. Periventricular and subcortical white matter hypodensities consistent with chronic small vessel ischemic disease are identified. No acute large vascular territory infarcts. No abnormal extra-axial fluid collections. Basal cisterns are not effaced and midline. VASCULAR: Moderate calcific atherosclerosis of the carotid siphons. SKULL: No skull fracture. No significant scalp soft tissue swelling. Redemonstration of slightly depressed impacted right frontal skull fracture without significant change, series 3/13. Redemonstration of fractures involving the right anterolateral and left posterolateral C1 ring. SINUSES/ORBITS: The mastoid air-cells are clear. The included paranasal sinuses are well-aerated.The included ocular globes and orbital  contents are non-suspicious. OTHER: None. IMPRESSION: 1. Redemonstration of right depressed frontal skull fracture with C1 ring fractures unchanged in appearance. 2. Stable moderate sulcal and ventricular prominence with moderate degree of chronic microvascular angiopathy. Chronic bilateral basal ganglial lacunar infarcts. No acute intracranial abnormality noted. Electronically Signed   By: Ashley Royalty M.D.   On: 06/29/2018 14:24    ____________________________________________   PROCEDURES  Procedure(s) performed:   Procedures  None ____________________________________________   INITIAL IMPRESSION / ASSESSMENT AND PLAN / ED COURSE  Pertinent labs & imaging results that were available during my care of the patient were reviewed by me and considered in my medical decision making (see chart for details).  Patient is a chronically ill-appearing 84 year old gentleman with dementia.  He has dry mucous membranes and appears clinically dehydrated.  He has a flat, nondistended abdomen with no focal tenderness on exam.  Patient is confirmed DNR with son at bedside. Plan for labs, IVF, Zofran, and reassess after acute abdomen x-ray. Patient with known cervical spine fx but not able to keep aspen collar appropriately tight. Will re-adjust here.   5:14 PM Lab work with no acute findings.  CT imaging with no acute findings.  Discussed with family.  They are having increasing struggles at home with  the patient.  He does have home health.  I do not see an indication for admission at this time.  They would like assistance with placement in a nursing facility from home.  I spoke with her case manager who reached out to their home health company.  They can begin looking for placement today and suspect that he will be able to be placed quickly.  They will need to be in coordination with the primary care physician who can complete paperwork necessary for placement.  Urinalysis is pending.  The patient is wearing  a condom catheter.  If positive for UTI would he will expect discharge with medication to treat UTI.  He has Phenergan at home for nausea.  Would continue that and focus on placement at this time rather than adding additional medication.   Care transferred to Dr. Darl Householder who will follow up UA.  ____________________________________________  FINAL CLINICAL IMPRESSION(S) / ED DIAGNOSES  Final diagnoses:  Non-intractable vomiting with nausea, unspecified vomiting type     MEDICATIONS GIVEN DURING THIS VISIT:  Medications  ondansetron (ZOFRAN) injection 4 mg (4 mg Intravenous Given 06/29/18 1104)  sodium chloride 0.9 % bolus 1,000 mL (0 mLs Intravenous Stopped 06/29/18 1623)   Note:  This document was prepared using Dragon voice recognition software and may include unintentional dictation errors.  Nanda Quinton, MD Emergency Medicine    Zayd Bonet, [facility], MD 06/29/18 [phone]

## 2018-06-29 NOTE — ED Notes (Signed)
Called [employer] @[phone]  (RN [place]).

## 2018-06-29 NOTE — H&P (Addendum)
History and Physical    Patrick Walker GGE:[id number] DOB: 1925-03-19 DOA: 06/29/2018  PCP: Patrick Downing, MD Patient coming from: Home  Chief Complaint: Nausea, vomiting, decreased p.o. intake  HPI: Patrick Walker is a 84 y.o. male with medical history significant of dementia, A. fib, CKD 3, BPH resenting to the hospital for evaluation of nausea, vomiting, and decreased p.o. Intake.  Per family at bedside, patient has baseline dementia and is only oriented to self on occasion.  Per family, since his hospital discharge 2 weeks ago patient has been having intermittent nausea, vomiting, and diarrhea.  Vomit is nonbloody and nonbilious.  He has had decreased oral intake.  Patient has not complained of any abdominal pain, fevers, or chills.  Family states the c-collar he received last time does not fit him well, he was seen by neurosurgery a few days ago but the collar was not replaced.  Patient was started on trazodone a week ago and family believes his nausea and vomiting have been worse since then.  He has not been able to sleep as the c-collar is not comfortable for him.  Patient was admitted from June 11, 2018 to June 13, 4649 for a fall complicated by C1 fracture.  He was placed on a c-collar with plan to follow-up with neurosurgery in 2 weeks.  Also had a mild AKI which resolved with IV fluid hydration.    ED Course: Vitals stable on arrival.  No leukocytosis.  Lipase normal.  LFTs normal.  UA not suggestive of infection. CT head showing redemonstration of right depressed frontal skull fracture with C1 ring fractures unchanged in appearance.  Stable moderate sulcal and ventricular prominence with moderate degree of chronic microvascular angiopathy.  Chronic bilateral basal ganglia lacunar infarcts.  No acute intracranial abnormality noted.  CT abdomen pelvis showing colonic diverticulosis without acute diverticulitis or bowel obstruction.  Patient received 2 L IV fluid boluses and IV  Zofran in the ED.  TRH patient admitted.  Review of Systems: As per HPI otherwise 10 point review of systems negative.  Past Medical History:  Diagnosis Date  . Aortic insufficiency    Mild, echo, November, 2011  . Aortic valve sclerosis    Echo, November, 2011  . Arthritis    "joints probably" (09/23/2017)  . Atrial fibrillation ([place])   . Atrial flutter [facility])    New diagnosis November, 2012  . CKD (chronic kidney disease), stage III ([place])    [name] 09/23/2017  . Difficulty in urination   . Ejection fraction    EF 55-60%, echo, November, 2012 ( rapid atrial flutter at that time)  . Fall from standing 09/22/2017   sustained a open depressed frontal skull fracture with some pneumocephalus. [name] 09/23/2017  . Glaucoma   . Hearing loss   . Irritable bowel disease   . Sinus bradycardia     Past Surgical History:  Procedure Laterality Date  . BACK SURGERY  2008-2009  . CATARACT EXTRACTION W/ INTRAOCULAR LENS  IMPLANT, BILATERAL Bilateral   . FRACTURE SURGERY    . HAND SURGERY Right    "chainsaw about cut it off"  . INGUINAL HERNIA REPAIR  03/02/2011  . INTRAMEDULLARY (IM) NAIL INTERTROCHANTERIC Right 12/15/2016   Procedure: INTRAMEDULLARY (IM) NAIL INTERTROCHANTRIC;  Surgeon: Renette Butters, MD;  Location: [facility];  Service: Orthopedics;  Laterality: Right;  . LEG SURGERY Left 1972   "crushed"     reports that he has never smoked. He has never used smokeless tobacco. He  reports that he does not drink alcohol or use drugs.  Allergies  Allergen Reactions  . Codeine Nausea And Vomiting  . Morphine And Related Nausea And Vomiting    History reviewed. No pertinent family history.  Prior to Admission medications   Medication Sig Start Date End Date Taking? Authorizing Provider  acetaminophen (TYLENOL) 325 MG tablet Take 1 tablet by mouth every 6 (six) hours as needed (pain/headache).    Yes [provider]  furosemide (LASIX) 20 MG tablet Take 20 mg by mouth daily.  06/09/18  Yes [provider]  hydrOXYzine (VISTARIL) 25 MG capsule Take 25 mg by mouth at bedtime. 03/10/18  Yes [provider]  latanoprost (XALATAN) 0.005 % ophthalmic solution Place 1 drop into both eyes at bedtime.   Yes [provider]  loperamide (IMODIUM A-D) 2 MG tablet Take 2 mg by mouth 4 (four) times daily as needed for diarrhea or loose stools.   Yes [provider]  Prenatal Vit-Fe Fumarate-FA (PRENATAL VITAMIN PO) Take 1 tablet by mouth at bedtime.    Yes [provider]  tamsulosin (FLOMAX) 0.4 MG CAPS capsule Take 0.4 mg by mouth at bedtime.   Yes [provider]  traZODone (DESYREL) 150 MG tablet Take 75-150 mg by mouth at bedtime as needed for sleep. 06/20/18  Yes [provider]  vitamin B-12 (CYANOCOBALAMIN) 1000 MCG tablet Take 1,000 mcg by mouth daily.    Yes [provider]  polyethylene glycol (MIRALAX) packet Take 17 g by mouth daily. Patient not taking: Reported on 06/11/2018 02/25/16   Lavina Hamman, MD    Physical Exam: Vitals:   06/29/18 1700 06/29/18 1730 06/29/18 1800 06/29/18 1830  BP: (!) 146/61 (!) 157/67 (!) 148/83 (!) 158/84  Pulse: 76 70 81 77  Resp: 10 12 20 16   Temp:      TempSrc:      SpO2: 99% 98%  97%   Physical Exam  Constitutional: No distress.  Resting comfortably in a hospital stretcher  HENT:  Dry mucous membranes  Neck: Neck supple. No tracheal deviation present.  C-collar appears too big, not fitting well   Cardiovascular: Normal rate, regular rhythm and intact distal pulses.  Murmur heard. Pulmonary/Chest: Effort normal. He has no wheezes. He has no rales.  Anterior lung fields clear to auscultation.  Abdominal: Soft. Bowel sounds are normal. He exhibits no distension. There is no tenderness. There is no guarding.  Musculoskeletal: He exhibits no edema.  Neurological:  Somnolent Following verbal commands  Skin: Skin is warm and dry. He is not diaphoretic.       Labs on Admission: I have personally reviewed following labs and imaging studies  CBC: Recent Labs  Lab 06/29/18 1105  WBC 6.0  NEUTROABS 4.7  HGB 11.8*  HCT 35.9*  MCV 96.8  PLT 213   Basic Metabolic Panel: Recent Labs  Lab 06/29/18 1105  NA 136  K 4.7  CL 98  CO2 29  GLUCOSE 103*  BUN 42*  CREATININE 1.75*  CALCIUM 9.6   GFR: CrCl cannot be calculated (Unknown ideal weight.). Liver Function Tests: Recent Labs  Lab 06/29/18 1105  AST 29  ALT 13  ALKPHOS 60  BILITOT 0.8  PROT 7.8  ALBUMIN 3.8   Recent Labs  Lab 06/29/18 1105  LIPASE 29   No results for input(s): AMMONIA in the last 168 hours. Coagulation Profile: No results for input(s): INR, PROTIME in the last 168 hours. Cardiac Enzymes: No results for  input(s): CKTOTAL, CKMB, CKMBINDEX, TROPONINI in the last 168 hours. BNP (last 3 results) No results for input(s): PROBNP in the last 8760 hours. HbA1C: No results for input(s): HGBA1C in the last 72 hours. CBG: No results for input(s): GLUCAP in the last 168 hours. Lipid Profile: No results for input(s): CHOL, HDL, LDLCALC, TRIG, CHOLHDL, LDLDIRECT in the last 72 hours. Thyroid Function Tests: No results for input(s): TSH, T4TOTAL, FREET4, T3FREE, THYROIDAB in the last 72 hours. Anemia Panel: No results for input(s): VITAMINB12, FOLATE, FERRITIN, TIBC, IRON, RETICCTPCT in the last 72 hours. Urine analysis:    Component Value Date/Time   COLORURINE YELLOW 06/29/2018 [place] 06/29/2018 1105   LABSPEC 1.014 06/29/2018 1105   PHURINE 6.0 06/29/2018 1105   GLUCOSEU NEGATIVE 06/29/2018 1105   HGBUR NEGATIVE 06/29/2018 1105   BILIRUBINUR NEGATIVE 06/29/2018 1105   KETONESUR 5 (A) 06/29/2018 1105   PROTEINUR 100 (A) 06/29/2018 1105   NITRITE NEGATIVE 06/29/2018 1105   LEUKOCYTESUR NEGATIVE 06/29/2018 1105    Radiological Exams on Admission: Ct Abdomen Pelvis Wo Contrast  Result Date: 06/29/2018 CLINICAL DATA:  Nausea  and vomiting EXAM: CT ABDOMEN AND PELVIS WITHOUT CONTRAST TECHNIQUE: Multidetector CT imaging of the abdomen and pelvis was performed following the standard protocol without IV contrast. COMPARISON:  03/26/2016 FINDINGS: Lower chest: Pectus excavatum configuration with cardiomegaly. Aortic atherosclerosis is noted. No significant coronary arteriosclerosis of the included heart. No pericardial effusion or thickening. Chronic age related interstitial change of the lungs with atelectasis at each lung base. Calcified pleural density in the right middle lobe compatible with calcified plaque. Hepatobiliary: The unenhanced liver is unremarkable. Physiologic distention of the gallbladder without stones. No pericholecystic fluid or secondary signs of acute cholecystitis. Pancreas: Normal Spleen: Normal Adrenals/Urinary Tract: Hypodense left adrenal nodule measuring 3.4 x 2.2 cm on series 2/18 is minimally larger but without other significant change. Right adrenal gland is normal. Simple appearing bilateral renal cysts. No nephrolithiasis nor obstructive uropathy. The urinary bladder is unremarkable. Stomach/Bowel: Nonobstructed, nondistended bowel loops. Stomach is unremarkable. Scattered colonic diverticulosis without acute diverticulitis along the descending and sigmoid colon. Additional scattered right-sided colonic diverticula are present without inflammatory change. No acute inflammatory process identified. Appendix not well visualized and may be surgically absent. Vascular/Lymphatic: Mild to moderate aortoiliac atherosclerosis without aneurysm. No adenopathy. Reproductive: Prostate is top-normal size. Other: No free air or free fluid. Musculoskeletal: Right femoral nail fixation hardware is intact. Degenerative disc and lumbar facet arthropathy. Prior L1 kyphoplasty. No aggressive osseous lesions or acute fracture. Degenerative change along the dorsal spine, both hips, sacroiliac joints and pubic symphysis. IMPRESSION:  1. Colonic diverticulosis without acute diverticulitis or bowel obstruction. 2. Minimally larger hypodense left adrenal nodule compatible with an adenoma measuring approximately 3.4 x 2.2 cm currently. 3. Calcified pleural plaque overlying the right middle lobe. 4. Pectus excavatum stable cardiomegaly. Electronically Signed   By: Ashley Royalty M.D.   On: 06/29/2018 14:18   Ct Head Wo Contrast  Result Date: 06/29/2018 CLINICAL DATA:  Dementia EXAM: CT HEAD WITHOUT CONTRAST TECHNIQUE: Contiguous axial images were obtained from the base of the skull through the vertex without intravenous contrast. COMPARISON:  06/11/2018 FINDINGS: BRAIN: There is sulcal and ventricular prominence consistent with superficial and central atrophy. No intraparenchymal hemorrhage, mass effect nor midline shift. Periventricular and subcortical white matter hypodensities consistent with chronic small vessel ischemic disease are identified. No acute large vascular territory infarcts. No abnormal extra-axial fluid collections. Basal cisterns are not effaced and midline. VASCULAR: Moderate  calcific atherosclerosis of the carotid siphons. SKULL: No skull fracture. No significant scalp soft tissue swelling. Redemonstration of slightly depressed impacted right frontal skull fracture without significant change, series 3/13. Redemonstration of fractures involving the right anterolateral and left posterolateral C1 ring. SINUSES/ORBITS: The mastoid air-cells are clear. The included paranasal sinuses are well-aerated.The included ocular globes and orbital contents are non-suspicious. OTHER: None. IMPRESSION: 1. Redemonstration of right depressed frontal skull fracture with C1 ring fractures unchanged in appearance. 2. Stable moderate sulcal and ventricular prominence with moderate degree of chronic microvascular angiopathy. Chronic bilateral basal ganglial lacunar infarcts. No acute intracranial abnormality noted. Electronically Signed   By: Ashley Royalty M.D.   On: 06/29/2018 14:24    EKG: Independently reviewed.  Sinus rhythm (heart rate 86), left axis deviation, baseline wander in lead III.  Assessment/Plan Principal Problem:   Viral gastroenteritis Active Problems:   Skull fracture (HCC)   Neck fracture (HCC)   History of fall   Atrial fibrillation (HCC)   CKD (chronic kidney disease) stage 3, GFR 30-59 ml/min (HCC)   BPH (benign prostatic hyperplasia)   Insomnia   Chronic anemia   Physical deconditioning   Adrenal nodule ([place])   Viral gastroenteritis -Patient is presenting with a 2-week history of nausea, NBNB vomiting, and diarrhea.  Afebrile and no leukocytosis.  Lipase normal.  LFTs normal.  UA not suggestive of infection. CT abdomen pelvis showing colonic diverticulosis without acute diverticulitis or bowel obstruction.   -IV fluid hydration -IV Zofran every 6 hours PRN nausea -C. difficile PCR panel -Imodium as needed  Recent fall complicated by frontal skull fracture and C1 fractures Admitted from 10/12-10/14 and was placed on a c-collar.  His c-collar appears to bag and is not fitting the patient well. CT head showing redemonstration of right depressed frontal skull fracture with C1 ring fractures unchanged in appearance.  -I have discussed this with neurosurgery, they recommended calling biomed.  Charge nurse will be contacting biomed.  A. Fib -CHA2DS2VASc 2-3 -Currently in sinus rhythm, rate controlled.  Not on anticoagulation due to advanced age and fall risk.  Diltiazem was discontinued during his previous hospitalization due to bradycardia.  CKD 3 -Creatinine 1.7, stable since hospital discharge 2 weeks ago.  BPH -Continue home Flomax 0.4 mg daily  Chronic anemia -Stable.  Hemoglobin 11.8, at baseline.  Incidental adrenal nodule -CT abdomen pelvis showing minimally large hypodense left adrenal nodule compatible with an adenoma measuring approximately 3.4 x 2.2 cm currently. -Patient will need  outpatient follow-up with PCP  Insomnia -Continue home hydroxyzine 25 mg at bedtime.  Hold home trazodone his nausea and vomiting are known adverse effects.  Physical deconditioning -PT eval  DVT prophylaxis: Lovenox 30 mg daily Code Status: Patient has dementia and is confused at baseline.  Son at bedside wants the patient to be DNR. Family Communication: Son and daughter at bedside updated. Disposition Plan: Anticipate discharge to home versus SNF in 1 to 2 days.  PT eval pending. Consults called: None Admission status: Observation   Shela Leff MD [facility] Pager [phone]  If 7PM-7AM, please contact night-coverage [email or web address] Password [email or web address]  06/29/2018, 7:32 PM

## 2018-06-29 NOTE — ED Notes (Signed)
ED TO INPATIENT HANDOFF REPORT  Name/Age/Gender Patrick Walker 84 y.o. male  Code Status    Code Status Orders  (From admission, onward)         Start     Ordered   06/29/18 1911  Do not attempt resuscitation (DNR)  Continuous    Question Answer Comment  In the event of cardiac or respiratory ARREST Do not call a "code blue"   In the event of cardiac or respiratory ARREST Do not perform Intubation, CPR, defibrillation or ACLS   In the event of cardiac or respiratory ARREST Use medication by any route, position, wound care, and other measures to relive pain and suffering. May use oxygen, suction and manual treatment of airway obstruction as needed for comfort.      06/29/18 1911        Code Status History    Date Active Date Inactive Code Status Order ID Comments User Context   06/11/2018 2015 06/13/2018 1747 DNR [id number]  Merton Border, MD Inpatient   09/22/2017 2356 09/23/2017 2116 DNR [id number]  Elmarie Shiley, MD Inpatient   12/15/2016 1602 12/18/2016 1757 DNR [id number]  Zada Finders, MD ED   02/23/2016 1705 02/25/2016 1848 Partial Code [id number]  Radene Gunning, NP Inpatient   02/23/2016 1553 02/23/2016 1705 Full Code [id number]  Black, Lezlie Octave, NP ED    Advance Directive Documentation     Most Recent Value  Type of Advance Directive  Healthcare Power of Attorney, Living will  Pre-existing out of facility DNR order (yellow form or pink MOST form)  -  "MOST" Form in Place?  -      Home/SNF/Other Home  Chief Complaint weakness / emesis   Level of Care/Admitting Diagnosis ED Disposition    ED Disposition Condition [place]: [facility] [100102]  Level of Care: Med-Surg [16]  Diagnosis: Viral gastroenteritis [443154]  Admitting Physician: Shela Leff [[id number]]  Attending Physician: Shela Leff [[id number]]  PT Class (Do Not Modify): Observation [104]  PT Acc Code (Do Not Modify): Observation [10022]        Medical History Past Medical History:  Diagnosis Date  . Aortic insufficiency    Mild, echo, November, 2011  . Aortic valve sclerosis    Echo, November, 2011  . Arthritis    "joints probably" (09/23/2017)  . Atrial fibrillation ([place])   . Atrial flutter [facility])    New diagnosis November, 2012  . CKD (chronic kidney disease), stage III ([place])    [name] 09/23/2017  . Difficulty in urination   . Ejection fraction    EF 55-60%, echo, November, 2012 ( rapid atrial flutter at that time)  . Fall from standing 09/22/2017   sustained a open depressed frontal skull fracture with some pneumocephalus. [name] 09/23/2017  . Glaucoma   . Hearing loss   . Irritable bowel disease   . Sinus bradycardia     Allergies Allergies  Allergen Reactions  . Codeine Nausea And Vomiting  . Morphine And Related Nausea And Vomiting    IV Location/Drains/Wounds Patient Lines/Drains/Airways Status   Active Line/Drains/Airways    Name:   Placement date:   Placement time:   Site:   Days:   Peripheral IV 06/29/18 Left Forearm   06/29/18    1103    Forearm   less than 1   External Urinary Catheter   09/23/17    1216    -   279   External Urinary Catheter  06/11/18    2010    -   18   Incision (Closed) 12/15/16 Hip Right   12/15/16    1918     561   Wound / Incision (Open or Dehisced) 09/23/17 Laceration Head Right   09/23/17    0042    Head   279   Wound / Incision (Open or Dehisced) 06/11/18 Laceration Head Right;Anterior   06/11/18    -    Head   18          Labs/Imaging Results for orders placed or performed during the hospital encounter of 06/29/18 (from the past 48 hour(s))  Comprehensive metabolic panel     Status: Abnormal   Collection Time: 06/29/18 11:05 AM  Result Value Ref Range   Sodium 136 135 - 145 mmol/L   Potassium 4.7 3.5 - 5.1 mmol/L   Chloride 98 98 - 111 mmol/L   CO2 29 22 - 32 mmol/L   Glucose, Bld 103 (H) 70 - 99 mg/dL   BUN 42 (H) 8 - 23 mg/dL   Creatinine, Ser 1.75 (H)  0.61 - 1.24 mg/dL   Calcium 9.6 8.9 - 10.3 mg/dL   Total Protein 7.8 6.5 - 8.1 g/dL   Albumin 3.8 3.5 - 5.0 g/dL   AST 29 15 - 41 U/L   ALT 13 0 - 44 U/L   Alkaline Phosphatase 60 38 - 126 U/L   Total Bilirubin 0.8 0.3 - 1.2 mg/dL   GFR calc non Af Amer 32 (L) >60 mL/min   GFR calc Af Amer 37 (L) >60 mL/min    Comment: (NOTE) The eGFR has been calculated using the CKD EPI equation. This calculation has not been validated in all clinical situations. eGFR's persistently <60 mL/min signify possible Chronic Kidney Disease.    Anion gap 9 5 - 15    Comment: Performed at [facility], [place] [address] [place], [place] [address]  Lipase, blood     Status: None   Collection Time: 06/29/18 11:05 AM  Result Value Ref Range   Lipase 29 11 - 51 U/L    Comment: Performed at [facility], [place] [address], [place], [place] [address]  CBC with Differential     Status: Abnormal   Collection Time: 06/29/18 11:05 AM  Result Value Ref Range   WBC 6.0 4.0 - 10.5 K/uL   RBC 3.71 (L) 4.22 - 5.81 MIL/uL   Hemoglobin 11.8 (L) 13.0 - 17.0 g/dL   HCT 35.9 (L) 39.0 - 52.0 %   MCV 96.8 80.0 - 100.0 fL   MCH 31.8 26.0 - 34.0 pg   MCHC 32.9 30.0 - 36.0 g/dL   RDW 11.8 11.5 - 15.5 %   Platelets 268 150 - 400 K/uL   nRBC 0.0 0.0 - 0.2 %   Neutrophils Relative % 79 %   Neutro Abs 4.7 1.7 - 7.7 K/uL   Lymphocytes Relative 8 %   Lymphs Abs 0.5 (L) 0.7 - 4.0 K/uL   Monocytes Relative 11 %   Monocytes Absolute 0.7 0.1 - 1.0 K/uL   Eosinophils Relative 0 %   Eosinophils Absolute 0.0 0.0 - 0.5 K/uL   Basophils Relative 0 %   Basophils Absolute 0.0 0.0 - 0.1 K/uL   Immature Granulocytes 2 %   Abs Immature Granulocytes 0.10 (H) 0.00 - 0.07 K/uL    Comment: Performed at [facility], [place] [address] [place], [place] [address]  Urinalysis, Routine w  reflex microscopic     Status: Abnormal   Collection Time: 06/29/18 11:05 AM  Result Value Ref Range    Color, Urine YELLOW YELLOW   APPearance CLEAR CLEAR   Specific Gravity, Urine 1.014 1.005 - 1.030   pH 6.0 5.0 - 8.0   Glucose, UA NEGATIVE NEGATIVE mg/dL   Hgb urine dipstick NEGATIVE NEGATIVE   Bilirubin Urine NEGATIVE NEGATIVE   Ketones, ur 5 (A) NEGATIVE mg/dL   Protein, ur 100 (A) NEGATIVE mg/dL   Nitrite NEGATIVE NEGATIVE   Leukocytes, UA NEGATIVE NEGATIVE   RBC / HPF 0-5 0 - 5 RBC/hpf   WBC, UA 0-5 0 - 5 WBC/hpf   Bacteria, UA NONE SEEN NONE SEEN   Mucus PRESENT     Comment: Performed at [facility], [place] [address] [place], [place] [address]   Ct Abdomen Pelvis Wo Contrast  Result Date: 06/29/2018 CLINICAL DATA:  Nausea and vomiting EXAM: CT ABDOMEN AND PELVIS WITHOUT CONTRAST TECHNIQUE: Multidetector CT imaging of the abdomen and pelvis was performed following the standard protocol without IV contrast. COMPARISON:  03/26/2016 FINDINGS: Lower chest: Pectus excavatum configuration with cardiomegaly. Aortic atherosclerosis is noted. No significant coronary arteriosclerosis of the included heart. No pericardial effusion or thickening. Chronic age related interstitial change of the lungs with atelectasis at each lung base. Calcified pleural density in the right middle lobe compatible with calcified plaque. Hepatobiliary: The unenhanced liver is unremarkable. Physiologic distention of the gallbladder without stones. No pericholecystic fluid or secondary signs of acute cholecystitis. Pancreas: Normal Spleen: Normal Adrenals/Urinary Tract: Hypodense left adrenal nodule measuring 3.4 x 2.2 cm on series 2/18 is minimally larger but without other significant change. Right adrenal gland is normal. Simple appearing bilateral renal cysts. No nephrolithiasis nor obstructive uropathy. The urinary bladder is unremarkable. Stomach/Bowel: Nonobstructed, nondistended bowel loops. Stomach is unremarkable. Scattered colonic diverticulosis without acute diverticulitis along the descending  and sigmoid colon. Additional scattered right-sided colonic diverticula are present without inflammatory change. No acute inflammatory process identified. Appendix not well visualized and may be surgically absent. Vascular/Lymphatic: Mild to moderate aortoiliac atherosclerosis without aneurysm. No adenopathy. Reproductive: Prostate is top-normal size. Other: No free air or free fluid. Musculoskeletal: Right femoral nail fixation hardware is intact. Degenerative disc and lumbar facet arthropathy. Prior L1 kyphoplasty. No aggressive osseous lesions or acute fracture. Degenerative change along the dorsal spine, both hips, sacroiliac joints and pubic symphysis. IMPRESSION: 1. Colonic diverticulosis without acute diverticulitis or bowel obstruction. 2. Minimally larger hypodense left adrenal nodule compatible with an adenoma measuring approximately 3.4 x 2.2 cm currently. 3. Calcified pleural plaque overlying the right middle lobe. 4. Pectus excavatum stable cardiomegaly. Electronically Signed   By: Ashley Royalty M.D.   On: 06/29/2018 14:18   Ct Head Wo Contrast  Result Date: 06/29/2018 CLINICAL DATA:  Dementia EXAM: CT HEAD WITHOUT CONTRAST TECHNIQUE: Contiguous axial images were obtained from the base of the skull through the vertex without intravenous contrast. COMPARISON:  06/11/2018 FINDINGS: BRAIN: There is sulcal and ventricular prominence consistent with superficial and central atrophy. No intraparenchymal hemorrhage, mass effect nor midline shift. Periventricular and subcortical white matter hypodensities consistent with chronic small vessel ischemic disease are identified. No acute large vascular territory infarcts. No abnormal extra-axial fluid collections. Basal cisterns are not effaced and midline. VASCULAR: Moderate calcific atherosclerosis of the carotid siphons. SKULL: No skull fracture. No significant scalp soft tissue swelling. Redemonstration of slightly depressed impacted right frontal skull  fracture without significant change, series 3/13. Redemonstration of fractures involving  the right anterolateral and left posterolateral C1 ring. SINUSES/ORBITS: The mastoid air-cells are clear. The included paranasal sinuses are well-aerated.The included ocular globes and orbital contents are non-suspicious. OTHER: None. IMPRESSION: 1. Redemonstration of right depressed frontal skull fracture with C1 ring fractures unchanged in appearance. 2. Stable moderate sulcal and ventricular prominence with moderate degree of chronic microvascular angiopathy. Chronic bilateral basal ganglial lacunar infarcts. No acute intracranial abnormality noted. Electronically Signed   By: Ashley Royalty M.D.   On: 06/29/2018 14:24   EKG Interpretation  Date/Time:  Wednesday June 29 2018 10:20:31 EDT Ventricular Rate:  86 PR Interval:    QRS Duration: 84 QT Interval:  382 QTC Calculation: 457 R Axis:   -34 Text Interpretation:  Sinus rhythm Probable left atrial enlargement Left axis deviation Baseline wander in lead(s) V3 No STEMI  Confirmed by Nanda Quinton [phone]) on 06/29/2018 10:30:58 AM Also confirmed by Nanda Quinton [phone]), editor Hattie Perch ([phone])  on 06/29/2018 12:22:39 PM   Pending Labs Unresulted Labs (From admission, onward)    Start     Ordered   06/29/18 1912  C difficile quick scan w PCR reflex  (C Difficile quick screen w PCR reflex panel)  Once, for 24 hours,   R     06/29/18 1911   06/29/18 1024  Urine culture  STAT,   STAT    Question:  Patient immune status  Answer:  Normal   06/29/18 1024          Vitals/Pain Today's Vitals   06/29/18 1830 06/29/18 2047 06/29/18 2053 06/29/18 2238  BP: (!) 158/84  (!) 151/77 137/69  Pulse: 77  74 70  Resp: _0 Temp:   98.4 F (36.9 C) 98.4 F (36.9 C)  TempSrc:   Oral Oral  SpO2: 97%  98% 98%  Weight:  48.3 kg    Height:  5' 2" (1.575 m)    PainSc:   0-No pain 0-No pain    Isolation Precautions Enteric precautions (UV  disinfection)  Medications Medications  ondansetron (ZOFRAN) injection 4 mg (has no administration in time range)  0.9 %  sodium chloride infusion (100 mL/hr Intravenous New Bag/Given 06/29/18 2053)  acetaminophen (TYLENOL) tablet 325 mg (325 mg Oral Given 06/29/18 2233)  hydrOXYzine (ATARAX/VISTARIL) tablet 25 mg (25 mg Oral Given 06/29/18 2233)  loperamide (IMODIUM) capsule 2 mg (has no administration in time range)  tamsulosin (FLOMAX) capsule 0.4 mg (0.4 mg Oral Given 06/29/18 2233)  vitamin B-12 (CYANOCOBALAMIN) tablet 1,000 mcg (1,000 mcg Oral Not Given 06/29/18 2112)  latanoprost (XALATAN) 0.005 % ophthalmic solution 1 drop (1 drop Both Eyes Given 06/29/18 2237)  enoxaparin (LOVENOX) injection 30 mg (30 mg Subcutaneous Given 06/29/18 2232)  ondansetron (ZOFRAN) injection 4 mg (4 mg Intravenous Given 06/29/18 1104)  sodium chloride 0.9 % bolus 1,000 mL (0 mLs Intravenous Stopped 06/29/18 1623)  sodium chloride 0.9 % bolus 1,000 mL (0 mLs Intravenous Stopped 06/29/18 2053)    Mobility non-ambulatory

## 2018-06-29 NOTE — Care Management Note (Signed)
Case Management Note  Patient Details  Name: Patrick Walker MRN: [id number] Date of Birth: 1924-12-17  CM noted that pt was active with [facility].  Updated Dorian Pod that pt was in the ED.  CM consulted by Dr. Laverta [place] with request for [facility] services and facility placement from home.  CM updated Dorian Pod who advised they will be in contact with the pt/family ASAP to assess needs and start the process.  Updated Dr. Laverta Baltimore.  No further CM needs noted at this time.  Expected Discharge Date:   06/29/2018               Expected Discharge Plan:  [place]  Discharge planning Services  CM Consult  Post Acute Care Choice:  Home Health Choice offered to:  Patient, Adult Children  HH Arranged:  PT, OT, RN, Nurse's Aide, Social Work [employer] Agency:  [facility]  Status of Service:  Completed, signed off  Rae Mar, RN 06/29/2018, 3:06 PM

## 2018-06-29 NOTE — Care Management Note (Signed)
Case Management Note  CM spoke with pt's son and daughter at bedside to discuss the pt's and their needs and the upcoming placement process through [facility].  Son, Elta [place],  advised they had been in contact with [facility] who said they could not accept the pt due to wondering.  CM discussed the need for a SNF with a locked memory care unit.  CM also updated [facility] with pt's son's contact information.  Pt will have a sitter with him 24/7 on return home today.  CM contacted CSW to provide family with a list of locked memory care SNFs in the area.  Updated Dr. Darl Householder.  No further CM needs noted at this time.  Sulayman Manning, Benjaman Lobe, RN 06/29/2018, 5:25 PM

## 2018-06-29 NOTE — ED Triage Notes (Signed)
Transported by [facility] from home--+weakness/ nausea/vomiting since last night. AAO x 2 which is baseline. Patient has also had trouble sleeping.

## 2018-06-29 NOTE — ED Notes (Signed)
Bed: WA06 Expected date:  Expected time:  Means of arrival:  Comments: EMS- 84 y/o weakness/emesis

## 2018-06-29 NOTE — ED Provider Notes (Signed)
  Physical Exam  BP (!) 146/61   Pulse 76   Temp 98.2 F (36.8 C) (Oral)   Resp 10   SpO2 99%   Physical Exam  ED Course/Procedures     Procedures  MDM  Patient care assumed at 4 pm. Patient had recent cervical fracture on aspen color here with vomiting, diarrhea, poor PO intake. Family unable to care for him at home. Sign out pending UA, PO trial, case management for home health and placement.   6:06 PM Patient still not taking much PO. UA showed some ketones. Case management will set up home health but home health unable to come tonight. Family request admission for observation for IVF. Family understands that placement will be done outpatient and not inpatient.       Drenda Freeze, MD 06/29/18 Tresa Moore

## 2018-06-29 NOTE — ED Notes (Signed)
Patrick Walker(son) [phone](home number) and cell number [id number].

## 2018-06-29 NOTE — Discharge Instructions (Signed)
You have been seen in the Emergency Department (ED) today for nausea and vomiting.  Your work up today has not shown a clear cause for your symptoms.   Your Home Health agency will be contacting you regarding placement options. Your PCP will need to be involved ASAP to assist with paperwork. Call them tomorrow morning.   Follow up with your doctor as soon as possible regarding todays emergent visit and your symptoms of nausea.   Return to the ED if you develop abdominal, bloody vomiting, bloody diarrhea, if you are unable to tolerate fluids due to vomiting, or if you develop other symptoms that concern you.

## 2018-06-30 DIAGNOSIS — M1990 Unspecified osteoarthritis, unspecified site: Secondary | ICD-10-CM | POA: Diagnosis present

## 2018-06-30 DIAGNOSIS — D3502 Benign neoplasm of left adrenal gland: Secondary | ICD-10-CM | POA: Diagnosis present

## 2018-06-30 DIAGNOSIS — Z961 Presence of intraocular lens: Secondary | ICD-10-CM | POA: Diagnosis present

## 2018-06-30 DIAGNOSIS — D649 Anemia, unspecified: Secondary | ICD-10-CM | POA: Diagnosis not present

## 2018-06-30 DIAGNOSIS — R112 Nausea with vomiting, unspecified: Secondary | ICD-10-CM | POA: Diagnosis present

## 2018-06-30 DIAGNOSIS — R296 Repeated falls: Secondary | ICD-10-CM | POA: Diagnosis present

## 2018-06-30 DIAGNOSIS — Z66 Do not resuscitate: Secondary | ICD-10-CM | POA: Diagnosis present

## 2018-06-30 DIAGNOSIS — H9190 Unspecified hearing loss, unspecified ear: Secondary | ICD-10-CM | POA: Diagnosis present

## 2018-06-30 DIAGNOSIS — F0281 Dementia in other diseases classified elsewhere with behavioral disturbance: Secondary | ICD-10-CM | POA: Diagnosis present

## 2018-06-30 DIAGNOSIS — E43 Unspecified severe protein-calorie malnutrition: Secondary | ICD-10-CM | POA: Diagnosis present

## 2018-06-30 DIAGNOSIS — A084 Viral intestinal infection, unspecified: Secondary | ICD-10-CM | POA: Diagnosis not present

## 2018-06-30 DIAGNOSIS — Q676 Pectus excavatum: Secondary | ICD-10-CM | POA: Diagnosis not present

## 2018-06-30 DIAGNOSIS — I4891 Unspecified atrial fibrillation: Secondary | ICD-10-CM | POA: Diagnosis not present

## 2018-06-30 DIAGNOSIS — N400 Enlarged prostate without lower urinary tract symptoms: Secondary | ICD-10-CM | POA: Diagnosis present

## 2018-06-30 DIAGNOSIS — E860 Dehydration: Secondary | ICD-10-CM | POA: Diagnosis present

## 2018-06-30 DIAGNOSIS — I351 Nonrheumatic aortic (valve) insufficiency: Secondary | ICD-10-CM | POA: Diagnosis present

## 2018-06-30 DIAGNOSIS — K5730 Diverticulosis of large intestine without perforation or abscess without bleeding: Secondary | ICD-10-CM | POA: Diagnosis present

## 2018-06-30 DIAGNOSIS — G4700 Insomnia, unspecified: Secondary | ICD-10-CM | POA: Diagnosis present

## 2018-06-30 DIAGNOSIS — F05 Delirium due to known physiological condition: Secondary | ICD-10-CM | POA: Diagnosis present

## 2018-06-30 DIAGNOSIS — I4892 Unspecified atrial flutter: Secondary | ICD-10-CM | POA: Diagnosis present

## 2018-06-30 DIAGNOSIS — K589 Irritable bowel syndrome without diarrhea: Secondary | ICD-10-CM | POA: Diagnosis present

## 2018-06-30 DIAGNOSIS — R7989 Other specified abnormal findings of blood chemistry: Secondary | ICD-10-CM | POA: Diagnosis present

## 2018-06-30 DIAGNOSIS — R627 Adult failure to thrive: Secondary | ICD-10-CM | POA: Diagnosis present

## 2018-06-30 DIAGNOSIS — N183 Chronic kidney disease, stage 3 (moderate): Secondary | ICD-10-CM | POA: Diagnosis not present

## 2018-06-30 DIAGNOSIS — Z9181 History of falling: Secondary | ICD-10-CM | POA: Diagnosis not present

## 2018-06-30 DIAGNOSIS — H409 Unspecified glaucoma: Secondary | ICD-10-CM | POA: Diagnosis present

## 2018-06-30 DIAGNOSIS — D631 Anemia in chronic kidney disease: Secondary | ICD-10-CM | POA: Diagnosis present

## 2018-06-30 DIAGNOSIS — E279 Disorder of adrenal gland, unspecified: Secondary | ICD-10-CM | POA: Diagnosis not present

## 2018-06-30 DIAGNOSIS — G309 Alzheimer's disease, unspecified: Secondary | ICD-10-CM | POA: Diagnosis present

## 2018-06-30 MED ORDER — TRAZODONE HCL 50 MG PO TABS
75.0000 mg | ORAL_TABLET | Freq: Every evening | ORAL | Status: DC | PRN
  Administered 2018-07-01 – 2018-07-03 (×3): 75 mg via ORAL
  Filled 2018-06-30 (×3): qty 2

## 2018-06-30 MED ORDER — ENSURE ENLIVE PO LIQD
237.0000 mL | Freq: Two times a day (BID) | ORAL | Status: DC
  Administered 2018-06-30 – 2018-07-05 (×7): 237 mL via ORAL

## 2018-06-30 MED ORDER — HALOPERIDOL LACTATE 5 MG/ML IJ SOLN
5.0000 mg | Freq: Once | INTRAMUSCULAR | Status: AC
  Administered 2018-06-30: 5 mg via INTRAVENOUS
  Filled 2018-06-30: qty 1

## 2018-06-30 NOTE — Evaluation (Signed)
Physical Therapy Evaluation Patient Details Name: Patrick Walker MRN: [id number] DOB: 08/27/1925 Today's Date: 06/30/2018   History of Present Illness  Patient is a 84 year old male recently admitted for syncope and found to have Multiple mildly displaced fractures through the C1 ring and Nondisplaced fracture through the mid aspect of the dens. Pt presented to Ed 06/29/18 for nausea and vomiting and found to have viral gastroenteritis.  PMH includes hearing loss, dementia, glaucoma, CKD, A-fib, sinus bradycardia, aortic insufficiency.   Clinical Impression  Pt admitted with above diagnosis. Pt currently with functional limitations due to the deficits listed below (see PT Problem List).  Pt will benefit from skilled PT to increase their independence and safety with mobility to allow discharge to the venue listed below.   Pt assisted with ambulating short distance in hallway.  Pt requiring min-mod assist for mobility at this time.  Family agreeable to SNF upon d/c.  Pt presents as high falls risk at this time.     Follow Up Recommendations SNF    Equipment Recommendations  None recommended by PT    Recommendations for Other Services       Precautions / Restrictions Precautions Precautions: Cervical;Fall Precaution Comments: bio-tech applied new c-collar prior to evaluation - appears to fit correctly Required Braces or Orthoses: Cervical Brace Cervical Brace: Hard collar;At all times      Mobility  Bed Mobility Overal bed mobility: Needs Assistance Bed Mobility: Rolling;Sidelying to Sit Rolling: Min assist Sidelying to sit: Mod assist;HOB elevated       General bed mobility comments: Step by step cues for log roll technique, assist to roll and bring trunk upright  Transfers Overall transfer level: Needs assistance Equipment used: Rolling walker (2 wheeled) Transfers: Sit to/from Stand Sit to Stand: Min assist         General transfer comment: assist to rise and steady,  posterior lean against bed upon standing  Ambulation/Gait Ambulation/Gait assistance: Min assist Gait Distance (Feet): 60 Feet Assistive device: Rolling walker (2 wheeled) Gait Pattern/deviations: Step-through pattern;Decreased stride length;Narrow base of support     General Gait Details: assist for stabilizing, pt tends to lean towards Left side and stay on left side of RW  Stairs            Wheelchair Mobility    Modified Rankin (Stroke Patients Only)       Balance Overall balance assessment: Needs assistance         Standing balance support: During functional activity;Bilateral upper extremity supported Standing balance-Leahy Scale: Zero Standing balance comment: requires UE support, also posterior lean upon standing requiring correction assist                             Pertinent Vitals/Pain Pain Assessment: (P) Faces Faces Pain Scale: (P) No hurt Pain Intervention(s): (P) Repositioned;Monitored during session    Home Living Family/patient expects to be discharged to:: Skilled nursing facility Living Arrangements: Alone Available Help at Discharge: Family;Available 24 hours/day Type of Home: House Home Access: Stairs to enter Entrance Stairs-Rails: Right Entrance Stairs-Number of Steps: 5 Home Layout: Two level;Able to live on main level with bedroom/bathroom Home Equipment: Gilford Rile - 2 wheels;Cane - single point Additional Comments: above per recent admission and confirmed per family in room today    Prior Function Level of Independence: Needs assistance   Gait / Transfers Assistance Needed: uses RW for mobility, family reports 24/7 supervision  Hand Dominance        Extremity/Trunk Assessment        Lower Extremity Assessment Lower Extremity Assessment: Generalized weakness       Communication   Communication: HOH  Cognition Arousal/Alertness: Awake/alert Behavior During Therapy: WFL for tasks  assessed/performed Overall Cognitive Status: History of cognitive impairments - at baseline                                 General Comments: pt able to follow commands, pleasant and cooperative, hx of dementia      General Comments      Exercises     Assessment/Plan    PT Assessment Patient needs continued PT services  PT Problem List Decreased strength;Decreased mobility;Decreased safety awareness;Decreased knowledge of precautions;Decreased cognition;Cardiopulmonary status limiting activity;Decreased skin integrity;Decreased balance       PT Treatment Interventions Functional mobility training;Balance training;Patient/family education;Gait training;Therapeutic activities;Therapeutic exercise;Neuromuscular re-education;DME instruction    PT Goals (Current goals can be found in the Care Plan section)  Acute Rehab PT Goals PT Goal Formulation: With patient/family Time For Goal Achievement: 07/14/18 Potential to Achieve Goals: Good    Frequency Min 2X/week   Barriers to discharge        Co-evaluation               AM-PAC PT "6 Clicks" Daily Activity  Outcome Measure Difficulty turning over in bed (including adjusting bedclothes, sheets and blankets)?: Unable Difficulty moving from lying on back to sitting on the side of the bed? : Unable Difficulty sitting down on and standing up from a chair with arms (e.g., wheelchair, bedside commode, etc,.)?: Unable Help needed moving to and from a bed to chair (including a wheelchair)?: A Little Help needed walking in hospital room?: A Little Help needed climbing 3-5 steps with a railing? : A Lot 6 Click Score: 11    End of Session Equipment Utilized During Treatment: Gait belt;Cervical collar Activity Tolerance: Patient tolerated treatment well Patient left: in chair;with call bell/phone within reach;with chair alarm set;with family/visitor present Nurse Communication: Mobility status PT Visit Diagnosis:  Muscle weakness (generalized) (M62.81);Unsteadiness on feet (R26.81)    Time: 4944-9675 PT Time Calculation (min) (ACUTE ONLY): 22 min   Charges:   PT Evaluation $PT Eval Low Complexity: [place], PT, DPT Acute Rehabilitation Services Office: [phone] Pager: [phone]  Trena Platt 06/30/2018, 2:56 PM

## 2018-06-30 NOTE — Clinical Social Work Note (Signed)
Clinical Social Work Assessment  Patient Details  Name: Patrick Walker MRN: [id number] Date of Birth: 1925-07-29  Date of referral:  06/30/18               Reason for consult:  Discharge Planning                Permission sought to share information with:  Family Supports Permission granted to share information::     Name::     Patrick Walker, Patrick Walker  Agency::     Relationship::     Contact Information:     Housing/Transportation Living arrangements for the past 2 months:  Single Family Home Source of Information:  Medical Team, Adult Children Patient Interpreter Needed:  None Criminal Activity/Legal Involvement Pertinent to Current Situation/Hospitalization:  No - Comment as needed Significant Relationships:  Adult Children, Warehouse manager Lives with:  Self Do you feel safe going back to the place where you live?  (UTA) Need for family participation in patient care:  Yes (Comment)(pt with Alzheimer's- children make decisions)  Care giving concerns:  Pt admitted from home where he resides alone- has 24/7 care givers and adult children as support system.  Pt has Alzheimer's and is rarely oriented but to self per children. States he does require maximum supervision as he is able to ambulate and wander and needs prompting and help with ADLs. However, Patrick states pt responds agreeably to instruction and redirection. Family states they have begun to consider that pt may need to be placed out of home for memory care needs. Admitted under observation for gastroenteritis - resolving per team.  Recent hospitalization for neck fracture- Patrick states his previous collar was ill-fitting and causing issues- states refitted today and doing better.   Social Worker assessment / plan:  CSW consulted to assess DC needs. Discussed with pt's Patrick and Patrick to gather brief hx and description of care needs above.  Family familiar with SNF placement for short term rehab due to pt having been to  [facility] for rehab earlier this year. Patrick reports they feel pt's rehab needs are minimal at this time "he's doing about as much as he usually does," and that they instead have been considering long term placement in memory care. Have researched a memory care ALF in [place] and reached out to admissions however not far into process.  CSW discussed barriers to ALF memory care placement from hospital (ALF/memory care typically not deemed a post-acute need and often managed in outpatient setting as facility assessments, finances, ADL requirements, and availability on memory care units at different facilities can vary vastly). Patrick very understanding.  CSW explained that short term rehab can be pursued (therapy session recommending this may be beneficial), and that unless observation status changes, rehab would be out of pocket cost (due to medicare inpatient 3 night requirement). Patrick and Patrick asked to be given time to discuss options and wishes and will call CSW back with decisions so that CSW can assist as possible.  Employment status:  Retired Forensic scientist:  [employer] PT Recommendations:  [place] / Referral to community resources:     Patient/Family's Response to care:  Appreciative   Patient/Family's Understanding of and Emotional Response to Diagnosis, Current Treatment, and Prognosis:  Pt's mental status consistent with dementia and he is unable to demonstrate any understanding. Family however very engaged and reasonable in seeking education on options for care for pt and understanding of resources and processes. Emotionally pleasant and  very engaged in planning.   Emotional Assessment Appearance:  Appears stated age Attitude/Demeanor/Rapport:    Affect (typically observed):    Orientation:  Oriented to Self(at times) Alcohol / Substance use:  Not Applicable Psych involvement (Current and /or in the community):  No (Comment)  Discharge Needs   Concerns to be addressed:  Discharge Planning Concerns, Decision making concerns Readmission within the last 30 days:  Yes Current discharge risk:  Cognitively Impaired, Lives alone Barriers to Discharge:      Nila Nephew, LCSW 06/30/2018, 3:17 PM  [phone]

## 2018-06-30 NOTE — Progress Notes (Signed)
PROGRESS NOTE    Patrick Walker  VEH:[id number] DOB: 22-Oct-1924 DOA: 06/29/2018 PCP: Leonard Downing, MD   Brief Narrative:  84 year old with past medical history relevant for stage III CKD, atrial fibrillation with SVR, BPH, dementia, history of C1 ring fractures who presented with nausea, vomiting.   Assessment & Plan:   Principal Problem:   Viral gastroenteritis Active Problems:   Skull fracture ([place])   Neck fracture (HCC)   History of fall   Atrial fibrillation (HCC)   CKD (chronic kidney disease) stage 3, GFR 30-59 ml/min (HCC)   BPH (benign prostatic hyperplasia)   Insomnia   Chronic anemia   Physical deconditioning   Adrenal nodule ([place])   #) Nausea/vomiting: Suspect most likely viral gastroenteritis.  Patient has minimal diarrhea at all.  This appears to resolved.  CT abdomen pelvis shows no evidence of intra-abdominal catastrophe. -Complete out 12 hours of IV fluids -PRN antiemetics -Regular diet  #) Dementia/agitation: Per discussion with the patient's son and daughter the patient has been very difficult to manage at home because of his underlying dementia and sundowning.  He has been getting up and wandering.  He apparently has 24/7 caregivers who report they can no longer take care of him. -Case management consult -Social work consult  #) Stage III CKD: Baseline creatinine is approximately 1.5-1.7.  Patient came in with mildly elevated creatinine. -IV fluids  #) Atrial fibrillation with SVR: Patient is opted not to be anticoagulated his PCP took him off anticoagulation due to recurrent falls.  #) C1 fracture: Patient was recently hospitalized and discharged with C1 fracture with nonoperative management and supportive care as he had neuro neuro deficits.  He continues not to have any neuro deficits. -Continue neck collar  #) BPH: -Continue tamsulosin  Fluids: Gentle IV fluids for 1 day Elect lites: Monitor and supplement Nutrition: Regular  diet  Prophylaxis: Enoxaparin  Disposition: Pending placement and evaluation by PT   DO NOT RESUSCITATE   Consultants:   None  Procedures:   None  Antimicrobials:   None   Subjective: Patient is lying in bed but does not report any pain.  He denies any nausea, vomiting, abdominal pain, diarrhea, cough, congestion.  Discussed with the patient's son and daughter they report that while patient does have 26 7 care at home that they are increasingly unable to manage him due to his severe Alzheimer's and agitation.  They are requesting placement.  Objective: Vitals:   06/29/18 2053 06/29/18 2238 06/29/18 2310 06/30/18 0527  BP: (!) 151/77 137/69 132/71 (!) 138/50  Pulse: 74 70 70 (!) 46  Resp: 15 16 16 16   Temp: 98.4 F (36.9 C) 98.4 F (36.9 C) 98.9 F (37.2 C) 98.2 F (36.8 C)  TempSrc: Oral Oral Oral Oral  SpO2: 98% 98% 97% 97%  Weight:   44.7 kg   Height:        Intake/Output Summary (Last 24 hours) at 06/30/2018 1331 Last data filed at 06/30/2018 0229 Gross per 24 hour  Intake 2203.5 ml  Output -  Net 2203.5 ml   Filed Weights   06/29/18 2047 06/29/18 2310  Weight: 48.3 kg 44.7 kg    Examination:  General exam: Appears calm and comfortable  Respiratory system: Clear to auscultation. Respiratory effort normal. Cardiovascular system: Regular rate and rhythm, no murmurs Gastrointestinal system: Soft, nontender, no rebound or guarding, plus bowel sounds. Central nervous system: Alert and oriented.  Grossly intact, moving all extremities Extremities: No lower extremity edema Skin:  No rashes, lesions or ulcers Psychiatry: Judgement and insight appear normal. Mood & affect appropriate.     Data Reviewed: I have personally reviewed following labs and imaging studies  CBC: Recent Labs  Lab 06/29/18 1105  WBC 6.0  NEUTROABS 4.7  HGB 11.8*  HCT 35.9*  MCV 96.8  PLT 502   Basic Metabolic Panel: Recent Labs  Lab 06/29/18 1105  NA 136  K 4.7  CL  98  CO2 29  GLUCOSE 103*  BUN 42*  CREATININE 1.75*  CALCIUM 9.6   GFR: Estimated Creatinine Clearance: 17.4 mL/min (A) (by C-G formula based on SCr of 1.75 mg/dL (H)). Liver Function Tests: Recent Labs  Lab 06/29/18 1105  AST 29  ALT 13  ALKPHOS 60  BILITOT 0.8  PROT 7.8  ALBUMIN 3.8   Recent Labs  Lab 06/29/18 1105  LIPASE 29   No results for input(s): AMMONIA in the last 168 hours. Coagulation Profile: No results for input(s): INR, PROTIME in the last 168 hours. Cardiac Enzymes: No results for input(s): CKTOTAL, CKMB, CKMBINDEX, TROPONINI in the last 168 hours. BNP (last 3 results) No results for input(s): PROBNP in the last 8760 hours. HbA1C: No results for input(s): HGBA1C in the last 72 hours. CBG: No results for input(s): GLUCAP in the last 168 hours. Lipid Profile: No results for input(s): CHOL, HDL, LDLCALC, TRIG, CHOLHDL, LDLDIRECT in the last 72 hours. Thyroid Function Tests: No results for input(s): TSH, T4TOTAL, FREET4, T3FREE, THYROIDAB in the last 72 hours. Anemia Panel: No results for input(s): VITAMINB12, FOLATE, FERRITIN, TIBC, IRON, RETICCTPCT in the last 72 hours. Sepsis Labs: No results for input(s): PROCALCITON, LATICACIDVEN in the last 168 hours.  No results found for this or any previous visit (from the past 240 hour(s)).       Radiology Studies: Ct Abdomen Pelvis Wo Contrast  Result Date: 06/29/2018 CLINICAL DATA:  Nausea and vomiting EXAM: CT ABDOMEN AND PELVIS WITHOUT CONTRAST TECHNIQUE: Multidetector CT imaging of the abdomen and pelvis was performed following the standard protocol without IV contrast. COMPARISON:  03/26/2016 FINDINGS: Lower chest: Pectus excavatum configuration with cardiomegaly. Aortic atherosclerosis is noted. No significant coronary arteriosclerosis of the included heart. No pericardial effusion or thickening. Chronic age related interstitial change of the lungs with atelectasis at each lung base. Calcified  pleural density in the right middle lobe compatible with calcified plaque. Hepatobiliary: The unenhanced liver is unremarkable. Physiologic distention of the gallbladder without stones. No pericholecystic fluid or secondary signs of acute cholecystitis. Pancreas: Normal Spleen: Normal Adrenals/Urinary Tract: Hypodense left adrenal nodule measuring 3.4 x 2.2 cm on series 2/18 is minimally larger but without other significant change. Right adrenal gland is normal. Simple appearing bilateral renal cysts. No nephrolithiasis nor obstructive uropathy. The urinary bladder is unremarkable. Stomach/Bowel: Nonobstructed, nondistended bowel loops. Stomach is unremarkable. Scattered colonic diverticulosis without acute diverticulitis along the descending and sigmoid colon. Additional scattered right-sided colonic diverticula are present without inflammatory change. No acute inflammatory process identified. Appendix not well visualized and may be surgically absent. Vascular/Lymphatic: Mild to moderate aortoiliac atherosclerosis without aneurysm. No adenopathy. Reproductive: Prostate is top-normal size. Other: No free air or free fluid. Musculoskeletal: Right femoral nail fixation hardware is intact. Degenerative disc and lumbar facet arthropathy. Prior L1 kyphoplasty. No aggressive osseous lesions or acute fracture. Degenerative change along the dorsal spine, both hips, sacroiliac joints and pubic symphysis. IMPRESSION: 1. Colonic diverticulosis without acute diverticulitis or bowel obstruction. 2. Minimally larger hypodense left adrenal nodule compatible with an adenoma measuring  approximately 3.4 x 2.2 cm currently. 3. Calcified pleural plaque overlying the right middle lobe. 4. Pectus excavatum stable cardiomegaly. Electronically Signed   By: Ashley Royalty M.D.   On: 06/29/2018 14:18   Ct Head Wo Contrast  Result Date: 06/29/2018 CLINICAL DATA:  Dementia EXAM: CT HEAD WITHOUT CONTRAST TECHNIQUE: Contiguous axial images  were obtained from the base of the skull through the vertex without intravenous contrast. COMPARISON:  06/11/2018 FINDINGS: BRAIN: There is sulcal and ventricular prominence consistent with superficial and central atrophy. No intraparenchymal hemorrhage, mass effect nor midline shift. Periventricular and subcortical white matter hypodensities consistent with chronic small vessel ischemic disease are identified. No acute large vascular territory infarcts. No abnormal extra-axial fluid collections. Basal cisterns are not effaced and midline. VASCULAR: Moderate calcific atherosclerosis of the carotid siphons. SKULL: No skull fracture. No significant scalp soft tissue swelling. Redemonstration of slightly depressed impacted right frontal skull fracture without significant change, series 3/13. Redemonstration of fractures involving the right anterolateral and left posterolateral C1 ring. SINUSES/ORBITS: The mastoid air-cells are clear. The included paranasal sinuses are well-aerated.The included ocular globes and orbital contents are non-suspicious. OTHER: None. IMPRESSION: 1. Redemonstration of right depressed frontal skull fracture with C1 ring fractures unchanged in appearance. 2. Stable moderate sulcal and ventricular prominence with moderate degree of chronic microvascular angiopathy. Chronic bilateral basal ganglial lacunar infarcts. No acute intracranial abnormality noted. Electronically Signed   By: Ashley Royalty M.D.   On: 06/29/2018 14:24        Scheduled Meds: . enoxaparin (LOVENOX) injection  30 mg Subcutaneous Q24H  . hydrOXYzine  25 mg Oral QHS  . latanoprost  1 drop Both Eyes QHS  . tamsulosin  0.4 mg Oral QHS  . vitamin B-12  1,000 mcg Oral Daily   Continuous Infusions:   LOS: 0 days    Time spent: [place], MD [facility]  If 7PM-7AM, please contact night-coverage [email or web address] Password [email or web address] 06/30/2018, 1:31 PM

## 2018-06-30 NOTE — Progress Notes (Signed)
Initial Nutrition Assessment  DOCUMENTATION CODES:   Severe malnutrition in context of chronic illness, Underweight  INTERVENTION:   -Provide Ensure Enlive po BID, each supplement provides 350 kcal and 20 grams of protein -Staff will continue to encourage PO intake  NUTRITION DIAGNOSIS:   Severe Malnutrition related to chronic illness(severe alzheimer's) as evidenced by energy intake < or equal to 75% for > or equal to 1 month, percent weight loss, severe fat depletion, severe muscle depletion.  GOAL:   Patient will meet greater than or equal to 90% of their needs  MONITOR:   PO intake, Supplement acceptance, Labs, Weight trends, I & O's  REASON FOR ASSESSMENT:   Malnutrition Screening Tool   ASSESSMENT:   84 year old with past medical history relevant for stage III CKD, atrial fibrillation with SVR, BPH, dementia, history of C1 ring fractures who presented with nausea, vomiting.  Patient with advanced alzheimer's. Pt had N/V for ~2 weeks PTA. Currently per nurse tech, pt is eating a little bit of meals and tolerating his meals. Pt would benefit from Ensure supplements. Will order.  Per weight history pt weighed 110 lb on 3/15 (11% wt loss x 7 months, significant for time frame).   Labs reviewed. Medications: Vitamin B-12 tablet daily  NUTRITION - FOCUSED PHYSICAL EXAM:    Most Recent Value  Orbital Region  Mild depletion  Upper Arm Region  Severe depletion  Thoracic and Lumbar Region  Unable to assess  Buccal Region  Mild depletion  Temple Region  Moderate depletion  Clavicle Bone Region  Severe depletion  Clavicle and Acromion Bone Region  Severe depletion  Scapular Bone Region  Unable to assess  Dorsal Hand  Moderate depletion  Patellar Region  Unable to assess  Anterior Thigh Region  Unable to assess  Posterior Calf Region  Unable to assess  Edema (RD Assessment)  None       Diet Order:   Diet Order            Diet regular Room service appropriate? Yes;  Fluid consistency: Thin  Diet effective now              EDUCATION NEEDS:   Not appropriate for education at this time  Skin:  Skin Assessment: Reviewed RN Assessment  Last BM:  10/30  Height:   Ht Readings from Last 1 Encounters:  06/29/18 5\' 2"  (1.575 m)    Weight:   Wt Readings from Last 1 Encounters:  06/29/18 44.7 kg    Ideal Body Weight:  50 kg  BMI:  Body mass index is 18.02 kg/m.  Estimated Nutritional Needs:   Kcal:  1250-1450  Protein:  60-70g  Fluid:  1.5L/day   Clayton Bibles, MS, RD, LDN [place] Dietitian Pager: [phone] After Hours Pager: [phone]

## 2018-07-01 DIAGNOSIS — E43 Unspecified severe protein-calorie malnutrition: Secondary | ICD-10-CM

## 2018-07-01 LAB — RENAL FUNCTION PANEL
Albumin: 2.9 g/dL — ABNORMAL LOW (ref 3.5–5.0)
Anion gap: 6 (ref 5–15)
BUN: 23 mg/dL (ref 8–23)
CO2: 23 mmol/L (ref 22–32)
Calcium: 8.6 mg/dL — ABNORMAL LOW (ref 8.9–10.3)
Chloride: 111 mmol/L (ref 98–111)
Creatinine, Ser: 1.2 mg/dL (ref 0.61–1.24)
GFR calc Af Amer: 59 mL/min — ABNORMAL LOW (ref >60)
GFR calc non Af Amer: 51 mL/min — ABNORMAL LOW (ref >60)
Glucose, Bld: 112 mg/dL — ABNORMAL HIGH (ref 70–99)
Phosphorus: 1.4 mg/dL — ABNORMAL LOW (ref 2.5–4.6)
Potassium: 4 mmol/L (ref 3.5–5.1)
Sodium: 140 mmol/L (ref 135–145)

## 2018-07-01 LAB — URINE CULTURE
Culture: <10000 — AB
Special Requests: NORMAL

## 2018-07-01 MED ORDER — HALOPERIDOL LACTATE 5 MG/ML IJ SOLN
2.0000 mg | Freq: Four times a day (QID) | INTRAMUSCULAR | Status: DC | PRN
  Administered 2018-07-01 (×2): 2 mg via INTRAMUSCULAR
  Filled 2018-07-01 (×2): qty 1

## 2018-07-01 MED ORDER — LIP MEDEX EX OINT
TOPICAL_OINTMENT | CUTANEOUS | Status: AC
  Administered 2018-07-01: 10:00:00
  Filled 2018-07-01: qty 7

## 2018-07-01 MED ORDER — LORAZEPAM 2 MG/ML IJ SOLN
0.5000 mg | Freq: Once | INTRAMUSCULAR | Status: DC
  Filled 2018-07-01: qty 1

## 2018-07-01 NOTE — Progress Notes (Signed)
CSW received call from patient's son, Patrick [address] [place] [phone]). Patrick Walker expressed frustration with barriers to SNF placement discussed with his siblings [name] and [name], both live in [name]). CSW noted that patient's status is inpatient, relayed this information to [name].  Patrick Walker requesting that patient kept inpatient for continued medical work up and that "[facility] does the right thing, and doesn't rush a discharge." Patrick Walker is particularly concerned with his father's neck and states he is unaware of this being addressed or treated by medical team.  [name], [place] Worker [phone]

## 2018-07-01 NOTE — Progress Notes (Signed)
PROGRESS NOTE    Patrick Walker  KDT:[id number] DOB: [birth year] DOA: 06/29/2018 PCP: Leonard Downing, MD   Brief Narrative:  84 year old with past medical history relevant for stage III CKD, atrial fibrillation with SVR, BPH, dementia, history of C1 ring fractures who presented with nausea, vomiting.   Assessment & Plan:   Principal Problem:   Viral gastroenteritis Active Problems:   Skull fracture ([place])   Neck fracture ([facility])   History of fall   Atrial fibrillation ([facility])   CKD (chronic kidney disease) stage 3, GFR 30-59 ml/min ([facility])   BPH (benign prostatic hyperplasia)   Insomnia   Chronic anemia   Physical deconditioning   Adrenal nodule ([facility])   Protein-calorie malnutrition, severe  #) Dementia/agitation: Patient continues to have episodes of significant agitation.  He apparently could not be managed at home even with 24/7 caregivers due to his severe and underlying Alzheimer's disease.  He continues to be intermittently agitated here particularly at night but even during the day. -Case management consult -Social work consult -Continue PRN haloperidol  #) Nausea/vomiting: This appears to have resolved.  CT abdomen pelvis shows no evidence of intra-abdominal catastrophe. -PRN antiemetics -Regular diet  #) Stage III CKD: Baseline creatinine is approximately 1.5-1.7.  Patient came in with mildly elevated creatinine. -IV fluids  #) Atrial fibrillation with SVR: Patient is opted not to be anticoagulated his PCP took him off anticoagulation due to recurrent falls.  #) C1 fracture: Patient was recently hospitalized and discharged with C1 fracture with nonoperative management and supportive care as he had neuro neuro deficits.  He continues not to have any neuro deficits. -Continue neck collar  #) BPH: -Continue tamsulosin  Fluids: Tolerating p.o. Elect lites: Monitor and supplement Nutrition: Regular diet  Prophylaxis: Enoxaparin  Disposition: Pending placement  and evaluation by PT   DO NOT RESUSCITATE   Consultants:   None  Procedures:   None  Antimicrobials:   None   Subjective: Patient is lying in bed in no acute distress.  He does not report much pain anywhere.  He continues to be minimally alert and oriented.  Apparently last night he was quite agitated and was trying to get out of bed and required a dose of haloperidol.  He otherwise denies any nausea, vomiting, diarrhea, cough, congestion rhinorrhea.  Objective: Vitals:   06/30/18 0527 06/30/18 1508 07/01/18 0223 07/01/18 0735  BP: (!) 138/50 (!) 150/72 (!) 153/76 (!) 156/75  Pulse: (!) 46 64 87 83  Resp: 16 18 20 20   Temp: 98.2 F (36.8 C) 98 F (36.7 C) 98.1 F (36.7 C) 97.7 F (36.5 C)  TempSrc: Oral Oral Oral Oral  SpO2: 97% 100% 98% 97%  Weight:      Height:       No intake or output data in the 24 hours ending 07/01/18 1252 Filed Weights   06/29/18 2047 06/29/18 2310  Weight: 48.3 kg 44.7 kg    Examination:  General exam: Appears calm and comfortable  Respiratory system: Clear to auscultation. Respiratory effort normal. Cardiovascular system: Regular rate and rhythm, no murmurs Gastrointestinal system: Soft, nontender, no rebound or guarding, plus bowel sounds. Central nervous system: Alert and oriented.  Grossly intact, moving all extremities, cervical collar in place Extremities: No lower extremity edema Skin: No rashes, lesions or ulcers Psychiatry: Unable to assess due to underlying dementia    Data Reviewed: I have personally reviewed following labs and imaging studies  CBC: Recent Labs  Lab 06/29/18 1105  WBC 6.0  NEUTROABS 4.7  HGB 11.8*  HCT 35.9*  MCV 96.8  PLT 329   Basic Metabolic Panel: Recent Labs  Lab 06/29/18 1105 07/01/18 0412  NA 136 140  K 4.7 4.0  CL 98 111  CO2 29 23  GLUCOSE 103* 112*  BUN 42* 23  CREATININE 1.75* 1.20  CALCIUM 9.6 8.6*  PHOS  --  1.4*   GFR: Estimated Creatinine Clearance: 25.4 mL/min  (by C-G formula based on SCr of 1.2 mg/dL). Liver Function Tests: Recent Labs  Lab 06/29/18 1105 07/01/18 0412  AST 29  --   ALT 13  --   ALKPHOS 60  --   BILITOT 0.8  --   PROT 7.8  --   ALBUMIN 3.8 2.9*   Recent Labs  Lab 06/29/18 1105  LIPASE 29   No results for input(s): AMMONIA in the last 168 hours. Coagulation Profile: No results for input(s): INR, PROTIME in the last 168 hours. Cardiac Enzymes: No results for input(s): CKTOTAL, CKMB, CKMBINDEX, TROPONINI in the last 168 hours. BNP (last 3 results) No results for input(s): PROBNP in the last 8760 hours. HbA1C: No results for input(s): HGBA1C in the last 72 hours. CBG: No results for input(s): GLUCAP in the last 168 hours. Lipid Profile: No results for input(s): CHOL, HDL, LDLCALC, TRIG, CHOLHDL, LDLDIRECT in the last 72 hours. Thyroid Function Tests: No results for input(s): TSH, T4TOTAL, FREET4, T3FREE, THYROIDAB in the last 72 hours. Anemia Panel: No results for input(s): VITAMINB12, FOLATE, FERRITIN, TIBC, IRON, RETICCTPCT in the last 72 hours. Sepsis Labs: No results for input(s): PROCALCITON, LATICACIDVEN in the last 168 hours.  Recent Results (from the past 240 hour(s))  Urine culture     Status: Abnormal   Collection Time: 06/29/18 11:05 AM  Result Value Ref Range Status   Specimen Description   Final    URINE, CLEAN CATCH Performed at [facility], [place] [address], [place], [place] [address]    Special Requests   Final    Normal Performed at [facility], [place] [address], [place], [place] [address]    Culture (A)  Final    <10,000 COLONIES/mL INSIGNIFICANT GROWTH Performed at [place] [address], [place], [place] [address]    Report Status 07/01/2018 FINAL  Final         Radiology Studies: Ct Abdomen Pelvis Wo Contrast  Result Date: 06/29/2018 CLINICAL DATA:  Nausea and vomiting EXAM: CT ABDOMEN AND PELVIS WITHOUT CONTRAST TECHNIQUE:  Multidetector CT imaging of the abdomen and pelvis was performed following the standard protocol without IV contrast. COMPARISON:  03/26/2016 FINDINGS: Lower chest: Pectus excavatum configuration with cardiomegaly. Aortic atherosclerosis is noted. No significant coronary arteriosclerosis of the included heart. No pericardial effusion or thickening. Chronic age related interstitial change of the lungs with atelectasis at each lung base. Calcified pleural density in the right middle lobe compatible with calcified plaque. Hepatobiliary: The unenhanced liver is unremarkable. Physiologic distention of the gallbladder without stones. No pericholecystic fluid or secondary signs of acute cholecystitis. Pancreas: Normal Spleen: Normal Adrenals/Urinary Tract: Hypodense left adrenal nodule measuring 3.4 x 2.2 cm on series 2/18 is minimally larger but without other significant change. Right adrenal gland is normal. Simple appearing bilateral renal cysts. No nephrolithiasis nor obstructive uropathy. The urinary bladder is unremarkable. Stomach/Bowel: Nonobstructed, nondistended bowel loops. Stomach is unremarkable. Scattered colonic diverticulosis without acute diverticulitis along the descending and sigmoid colon. Additional scattered right-sided colonic diverticula are present without inflammatory change. No  acute inflammatory process identified. Appendix not well visualized and may be surgically absent. Vascular/Lymphatic: Mild to moderate aortoiliac atherosclerosis without aneurysm. No adenopathy. Reproductive: Prostate is top-normal size. Other: No free air or free fluid. Musculoskeletal: Right femoral nail fixation hardware is intact. Degenerative disc and lumbar facet arthropathy. Prior L1 kyphoplasty. No aggressive osseous lesions or acute fracture. Degenerative change along the dorsal spine, both hips, sacroiliac joints and pubic symphysis. IMPRESSION: 1. Colonic diverticulosis without acute diverticulitis or bowel  obstruction. 2. Minimally larger hypodense left adrenal nodule compatible with an adenoma measuring approximately 3.4 x 2.2 cm currently. 3. Calcified pleural plaque overlying the right middle lobe. 4. Pectus excavatum stable cardiomegaly. Electronically Signed   By: Ashley Royalty M.D.   On: 06/29/2018 14:18   Ct Head Wo Contrast  Result Date: 06/29/2018 CLINICAL DATA:  Dementia EXAM: CT HEAD WITHOUT CONTRAST TECHNIQUE: Contiguous axial images were obtained from the base of the skull through the vertex without intravenous contrast. COMPARISON:  06/11/2018 FINDINGS: BRAIN: There is sulcal and ventricular prominence consistent with superficial and central atrophy. No intraparenchymal hemorrhage, mass effect nor midline shift. Periventricular and subcortical white matter hypodensities consistent with chronic small vessel ischemic disease are identified. No acute large vascular territory infarcts. No abnormal extra-axial fluid collections. Basal cisterns are not effaced and midline. VASCULAR: Moderate calcific atherosclerosis of the carotid siphons. SKULL: No skull fracture. No significant scalp soft tissue swelling. Redemonstration of slightly depressed impacted right frontal skull fracture without significant change, series 3/13. Redemonstration of fractures involving the right anterolateral and left posterolateral C1 ring. SINUSES/ORBITS: The mastoid air-cells are clear. The included paranasal sinuses are well-aerated.The included ocular globes and orbital contents are non-suspicious. OTHER: None. IMPRESSION: 1. Redemonstration of right depressed frontal skull fracture with C1 ring fractures unchanged in appearance. 2. Stable moderate sulcal and ventricular prominence with moderate degree of chronic microvascular angiopathy. Chronic bilateral basal ganglial lacunar infarcts. No acute intracranial abnormality noted. Electronically Signed   By: Ashley Royalty M.D.   On: 06/29/2018 14:24        Scheduled Meds: .  enoxaparin (LOVENOX) injection  30 mg Subcutaneous Q24H  . feeding supplement (ENSURE ENLIVE)  237 mL Oral BID BM  . hydrOXYzine  25 mg Oral QHS  . latanoprost  1 drop Both Eyes QHS  . LORazepam  0.5 mg Intravenous Once  . tamsulosin  0.4 mg Oral QHS  . vitamin B-12  1,000 mcg Oral Daily   Continuous Infusions:   LOS: 1 day    Time spent: [place], MD [facility]  If 7PM-7AM, please contact night-coverage [email or web address] Password [email or web address] 07/01/2018, 12:52 PM

## 2018-07-02 NOTE — Progress Notes (Signed)
PROGRESS NOTE    Patrick Walker  YKD:[id number] DOB: 01-20-1925 DOA: 06/29/2018 PCP: Leonard Downing, MD   Brief Narrative:  84 year old with past medical history relevant for stage III CKD, atrial fibrillation with SVR, BPH, dementia, history of C1 ring fractures who presented with nausea, vomiting.  Currently pending placement.   Assessment & Plan:   Principal Problem:   Viral gastroenteritis Active Problems:   Skull fracture ([place])   Neck fracture (H[facility])   History of fall   Atrial fibrillation (HCC)   CKD (chronic kidney disease) stage 3, GFR 30-59 ml/min (HCC)   BPH (benign prostatic hyperplasia)   Insomnia   Chronic anemia   Physical deconditioning   Adrenal nodule ([facility]CC)   Protein-calorie malnutrition, severe  #) Dementia/agitation: Improved with PRN haloperidol, frequent redirecting. -Pending placement -Continue PRN haloperidol  #) Nausea/vomiting: This appears to have resolved.  CT abdomen pelvis shows no evidence of intra-abdominal catastrophe. -PRN antiemetics -Regular diet  #) Stage III CKD: Baseline creatinine is approximately 1.5-1.7.  Patient came in with mildly elevated creatinine.  #) Atrial fibrillation with SVR: Patient is opted not to be anticoagulated his PCP took him off anticoagulation due to recurrent falls.  #) C1 fracture: Patient was recently hospitalized and discharged with C1 fracture with nonoperative management and supportive care as he had neuro neuro deficits.  He continues not to have any neuro deficits. -Continue neck collar  #) BPH: -Continue tamsulosin  Fluids: Tolerating p.o. Elect lites: Monitor and supplement Nutrition: Regular diet  Prophylaxis: Enoxaparin  Disposition: Pending placement to skilled nursing facility   DO NOT RESUSCITATE   Consultants:   None  Procedures:   None  Antimicrobials:   None   Subjective: Patient is lying in bed in no acute distress.  On discussion with his son he appears to  be doing fairly well.  He has been less agitated.  He has not had any more episodes of nausea, vomiting, diarrhea, cough, congestion, rhinorrhea.  Objective: Vitals:   07/01/18 0735 07/01/18 1350 07/01/18 2208 07/02/18 0509  BP: (!) 156/75 (!) 144/76 (!) 164/73 (!) 173/51  Pulse: 83 66 86 (!) 48  Resp: 20 18 19 14   Temp: 97.7 F (36.5 C) (!) 97.5 F (36.4 C) 98.6 F (37 C) 97.6 F (36.4 C)  TempSrc: Oral Oral Oral Oral  SpO2: 97% 100% 98% 95%  Weight:      Height:        Intake/Output Summary (Last 24 hours) at 07/02/2018 1105 Last data filed at 07/02/2018 0124 Gross per 24 hour  Intake 120 ml  Output 600 ml  Net -480 ml   Filed Weights   06/29/18 2047 06/29/18 2310  Weight: 48.3 kg 44.7 kg    Examination:  General exam: Appears calm and comfortable  Respiratory system: Clear to auscultation. Respiratory effort normal. Cardiovascular system: Regular rate and rhythm, no murmurs Gastrointestinal system: Soft, nontender, no rebound or guarding, plus bowel sounds. Central nervous system: Alert and oriented.  Grossly intact, moving all extremities, cervical collar in place Extremities: No lower extremity edema Skin: No rashes, lesions or ulcers Psychiatry: Unable to assess due to underlying dementia    Data Reviewed: I have personally reviewed following labs and imaging studies  CBC: Recent Labs  Lab 06/29/18 1105  WBC 6.0  NEUTROABS 4.7  HGB 11.8*  HCT 35.9*  MCV 96.8  PLT 397   Basic Metabolic Panel: Recent Labs  Lab 06/29/18 1105 07/01/18 0412  NA 136 140  K 4.7  4.0  CL 98 111  CO2 29 23  GLUCOSE 103* 112*  BUN 42* 23  CREATININE 1.75* 1.20  CALCIUM 9.6 8.6*  PHOS  --  1.4*   GFR: Estimated Creatinine Clearance: 25.4 mL/min (by C-G formula based on SCr of 1.2 mg/dL). Liver Function Tests: Recent Labs  Lab 06/29/18 1105 07/01/18 0412  AST 29  --   ALT 13  --   ALKPHOS 60  --   BILITOT 0.8  --   PROT 7.8  --   ALBUMIN 3.8 2.9*   Recent  Labs  Lab 06/29/18 1105  LIPASE 29   No results for input(s): AMMONIA in the last 168 hours. Coagulation Profile: No results for input(s): INR, PROTIME in the last 168 hours. Cardiac Enzymes: No results for input(s): CKTOTAL, CKMB, CKMBINDEX, TROPONINI in the last 168 hours. BNP (last 3 results) No results for input(s): PROBNP in the last 8760 hours. HbA1C: No results for input(s): HGBA1C in the last 72 hours. CBG: No results for input(s): GLUCAP in the last 168 hours. Lipid Profile: No results for input(s): CHOL, HDL, LDLCALC, TRIG, CHOLHDL, LDLDIRECT in the last 72 hours. Thyroid Function Tests: No results for input(s): TSH, T4TOTAL, FREET4, T3FREE, THYROIDAB in the last 72 hours. Anemia Panel: No results for input(s): VITAMINB12, FOLATE, FERRITIN, TIBC, IRON, RETICCTPCT in the last 72 hours. Sepsis Labs: No results for input(s): PROCALCITON, LATICACIDVEN in the last 168 hours.  Recent Results (from the past 240 hour(s))  Urine culture     Status: Abnormal   Collection Time: 06/29/18 11:05 AM  Result Value Ref Range Status   Specimen Description   Final    URINE, CLEAN CATCH Performed at [facility], [place] [address] [place], [place] [address]    Special Requests   Final    Normal Performed at [facility], [place] [address], [place], [place] [address]    Culture (A)  Final    <10,000 COLONIES/mL INSIGNIFICANT GROWTH Performed at [facility] [address], [place], [place] [address]    Report Status 07/01/2018 FINAL  Final         Radiology Studies: No results found.      Scheduled Meds: . enoxaparin (LOVENOX) injection  30 mg Subcutaneous Q24H  . feeding supplement (ENSURE ENLIVE)  237 mL Oral BID BM  . hydrOXYzine  25 mg Oral QHS  . latanoprost  1 drop Both Eyes QHS  . LORazepam  0.5 mg Intravenous Once  . tamsulosin  0.4 mg Oral QHS  . vitamin B-12  1,000 mcg Oral Daily   Continuous Infusions:   LOS: 2  days    Time spent: [place], MD [facility]  If 7PM-7AM, please contact night-coverage [email or web address] Password [email or web address] 07/02/2018, 11:05 AM

## 2018-07-03 DIAGNOSIS — E279 Disorder of adrenal gland, unspecified: Secondary | ICD-10-CM

## 2018-07-03 MED ORDER — ASPIRIN EC 81 MG PO TBEC: 81.0000 mg | DELAYED_RELEASE_TABLET | Freq: Every day | ORAL | Status: DC

## 2018-07-03 NOTE — Progress Notes (Signed)
PROGRESS NOTE  Patrick Walker VOH:[id number] DOB: 1925/08/09 DOA: 06/29/2018 PCP: Leonard Downing, MD  Brief History:  84 y.o. male with medical history significant of dementia, A. fib, CKD 3, BPH resenting to the hospital for evaluation of nausea, vomiting, and decreased p.o. Intake.  Per family at bedside, patient has baseline dementia and is only oriented to self on occasion.  Per family, since his hospital discharge 2 weeks ago for a fall resulting in C1 fracture. Vomit is nonbloody and nonbilious.  He has had decreased oral intake.  Patient has not complained of any abdominal pain, fevers, or chills.  Vitals stable on arrival.  No leukocytosis.  Lipase normal.  LFTs normal.  UA not suggestive of infection. CT head showing redemonstration of right depressed frontal skull fracture with C1 ring fractures unchanged in appearance.  Stable moderate sulcal and ventricular prominence with moderate degree of chronic microvascular angiopathy.  Chronic bilateral basal ganglia lacunar infarcts.  No acute intracranial abnormality noted.  CT abdomen pelvis showing colonic diverticulosis without acute diverticulitis or bowel obstruction.  Patient received 2 L IV fluid boluses and IV Zofran in the ED.  [facility] patient admitted.   Assessment/Plan: Viral gastroenteritis -Vomiting has resolved -Patient has tolerated regular diet although he has decreased oral intake -No diarrhea since admission -No leukocytosis or fever -06/29/2018 CT abdomen--diverticulosis, left adrenal adenoma, right middle lobe pleural plaque, pectus excavatum  Failure to thrive in adult/severe protein calorie malnutrition -Continue Ensure twice daily -pt continues to have very poor po intake  Atrial fibrillation with slow ventricular response, type unspecified -Presently in sinus rhythm -Poor candidate for anticoagulation secondary to falls -Start aspirin 81 mg daily -CHADSVASc = 2  CKD stage III -Baseline  creatinine 1.5-1.7 -Furosemide was held secondary to dehydration and failure to thrive with decreased oral intake  C1 neck cervical spine fracture -Patient followed up with neurosurgery 06/27/2018--> continue cervical collar x3 months -Patient has follow-up appointment with neurosurgery in the office 07/14/2018  BPH -Continue tamsulosin  Dementia with behavioral disturbance -Continue Haldol as needed agitation -Family at bedside states that the patient is at his usual baseline mental status  Incidental adrenal nodule -CT abdomen pelvis showing minimally large hypodense left adrenal nodule compatible with an adenoma measuring approximately 3.4 x 2.2 cm currently. -Patient will need outpatient follow-up with PCP  Physical deconditioning -PT eval-->SNF    Disposition Plan:   SNF if bed available Family Communication:   Family at bedside updtated 11/3  Consultants:  none  Code Status:  FULL   DVT Prophylaxis:   [place] Lovenox   Procedures: As Listed in Progress Note Above  Antibiotics: None    Subjective: Patient denies fevers, chills, headache, chest pain, dyspnea, nausea, vomiting, diarrhea, abdominal pain, dysuria,   Objective: Vitals:   07/02/18 0509 07/02/18 1430 07/02/18 2012 07/03/18 0422  BP: (!) 173/51 122/62 140/70 140/88  Pulse: (!) 48 83 86 92  Resp: 14 18 14 12   Temp: 97.6 F (36.4 C) 98 F (36.7 C) 98.2 F (36.8 C) 98.4 F (36.9 C)  TempSrc: Oral Oral Oral Oral  SpO2: 95% 98% 97% 98%  Weight:      Height:        Intake/Output Summary (Last 24 hours) at 07/03/2018 1340 Last data filed at 07/03/2018 1157 Gross per 24 hour  Intake -  Output 1300 ml  Net -1300 ml   Weight change:  Exam:   General:  Pt is alert,  follows commands appropriately, not in acute distress  HEENT: No icterus, No thrush, No neck mass, [place]/AT  Cardiovascular: RRR, S1/S2, no rubs, no gallops  Respiratory: Bibasilar crackles but no wheezing.  Good air  movement.  Abdomen: Soft/+BS, non tender, non distended, no guarding  Extremities: No edema, No lymphangitis, No petechiae, No rashes, no synovitis   Data Reviewed: I have personally reviewed following labs and imaging studies Basic Metabolic Panel: Recent Labs  Lab 06/29/18 1105 07/01/18 0412  NA 136 140  K 4.7 4.0  CL 98 111  CO2 29 23  GLUCOSE 103* 112*  BUN 42* 23  CREATININE 1.75* 1.20  CALCIUM 9.6 8.6*  PHOS  --  1.4*   Liver Function Tests: Recent Labs  Lab 06/29/18 1105 07/01/18 0412  AST 29  --   ALT 13  --   ALKPHOS 60  --   BILITOT 0.8  --   PROT 7.8  --   ALBUMIN 3.8 2.9*   Recent Labs  Lab 06/29/18 1105  LIPASE 29   No results for input(s): AMMONIA in the last 168 hours. Coagulation Profile: No results for input(s): INR, PROTIME in the last 168 hours. CBC: Recent Labs  Lab 06/29/18 1105  WBC 6.0  NEUTROABS 4.7  HGB 11.8*  HCT 35.9*  MCV 96.8  PLT 268   Cardiac Enzymes: No results for input(s): CKTOTAL, CKMB, CKMBINDEX, TROPONINI in the last 168 hours. BNP: Invalid input(s): POCBNP CBG: No results for input(s): GLUCAP in the last 168 hours. HbA1C: No results for input(s): HGBA1C in the last 72 hours. Urine analysis:    Component Value Date/Time   COLORURINE YELLOW 06/29/2018 [place] 06/29/2018 1105   LABSPEC 1.014 06/29/2018 1105   PHURINE 6.0 06/29/2018 1105   GLUCOSEU NEGATIVE 06/29/2018 1105   HGBUR NEGATIVE 06/29/2018 1105   BILIRUBINUR NEGATIVE 06/29/2018 1105   KETONESUR 5 (A) 06/29/2018 1105   PROTEINUR 100 (A) 06/29/2018 1105   NITRITE NEGATIVE 06/29/2018 1105   LEUKOCYTESUR NEGATIVE 06/29/2018 1105   Sepsis Labs: @LABRCNTIP (procalcitonin:4,lacticidven:4) ) Recent Results (from the past 240 hour(s))  Urine culture     Status: Abnormal   Collection Time: 06/29/18 11:05 AM  Result Value Ref Range Status   Specimen Description   Final    URINE, CLEAN CATCH Performed at [facility], [place] [address], [place], [place] [address]    Special Requests   Final    Normal Performed at [facility], [place] [address], [place], [place] [address]    Culture (A)  Final    <10,000 COLONIES/mL INSIGNIFICANT GROWTH Performed at [place] [address], [place], [place] [address]    Report Status 07/01/2018 FINAL  Final     Scheduled Meds: . enoxaparin (LOVENOX) injection  30 mg Subcutaneous Q24H  . feeding supplement (ENSURE ENLIVE)  237 mL Oral BID BM  . hydrOXYzine  25 mg Oral QHS  . latanoprost  1 drop Both Eyes QHS  . LORazepam  0.5 mg Intravenous Once  . tamsulosin  0.4 mg Oral QHS  . vitamin B-12  1,000 mcg Oral Daily   Continuous Infusions:  Procedures/Studies: Ct Abdomen Pelvis Wo Contrast  Result Date: 06/29/2018 CLINICAL DATA:  Nausea and vomiting EXAM: CT ABDOMEN AND PELVIS WITHOUT CONTRAST TECHNIQUE: Multidetector CT imaging of the abdomen and pelvis was performed following the standard protocol without IV contrast. COMPARISON:  03/26/2016 FINDINGS: Lower chest: Pectus excavatum configuration with cardiomegaly. Aortic atherosclerosis is noted.  No significant coronary arteriosclerosis of the included heart. No pericardial effusion or thickening. Chronic age related interstitial change of the lungs with atelectasis at each lung base. Calcified pleural density in the right middle lobe compatible with calcified plaque. Hepatobiliary: The unenhanced liver is unremarkable. Physiologic distention of the gallbladder without stones. No pericholecystic fluid or secondary signs of acute cholecystitis. Pancreas: Normal Spleen: Normal Adrenals/Urinary Tract: Hypodense left adrenal nodule measuring 3.4 x 2.2 cm on series 2/18 is minimally larger but without other significant change. Right adrenal gland is normal. Simple appearing bilateral renal cysts. No nephrolithiasis nor obstructive uropathy. The urinary bladder is unremarkable. Stomach/Bowel:  Nonobstructed, nondistended bowel loops. Stomach is unremarkable. Scattered colonic diverticulosis without acute diverticulitis along the descending and sigmoid colon. Additional scattered right-sided colonic diverticula are present without inflammatory change. No acute inflammatory process identified. Appendix not well visualized and may be surgically absent. Vascular/Lymphatic: Mild to moderate aortoiliac atherosclerosis without aneurysm. No adenopathy. Reproductive: Prostate is top-normal size. Other: No free air or free fluid. Musculoskeletal: Right femoral nail fixation hardware is intact. Degenerative disc and lumbar facet arthropathy. Prior L1 kyphoplasty. No aggressive osseous lesions or acute fracture. Degenerative change along the dorsal spine, both hips, sacroiliac joints and pubic symphysis. IMPRESSION: 1. Colonic diverticulosis without acute diverticulitis or bowel obstruction. 2. Minimally larger hypodense left adrenal nodule compatible with an adenoma measuring approximately 3.4 x 2.2 cm currently. 3. Calcified pleural plaque overlying the right middle lobe. 4. Pectus excavatum stable cardiomegaly. Electronically Signed   By: Ashley Royalty M.D.   On: 06/29/2018 14:18   Ct Head Wo Contrast  Result Date: 06/29/2018 CLINICAL DATA:  Dementia EXAM: CT HEAD WITHOUT CONTRAST TECHNIQUE: Contiguous axial images were obtained from the base of the skull through the vertex without intravenous contrast. COMPARISON:  06/11/2018 FINDINGS: BRAIN: There is sulcal and ventricular prominence consistent with superficial and central atrophy. No intraparenchymal hemorrhage, mass effect nor midline shift. Periventricular and subcortical white matter hypodensities consistent with chronic small vessel ischemic disease are identified. No acute large vascular territory infarcts. No abnormal extra-axial fluid collections. Basal cisterns are not effaced and midline. VASCULAR: Moderate calcific atherosclerosis of the carotid  siphons. SKULL: No skull fracture. No significant scalp soft tissue swelling. Redemonstration of slightly depressed impacted right frontal skull fracture without significant change, series 3/13. Redemonstration of fractures involving the right anterolateral and left posterolateral C1 ring. SINUSES/ORBITS: The mastoid air-cells are clear. The included paranasal sinuses are well-aerated.The included ocular globes and orbital contents are non-suspicious. OTHER: None. IMPRESSION: 1. Redemonstration of right depressed frontal skull fracture with C1 ring fractures unchanged in appearance. 2. Stable moderate sulcal and ventricular prominence with moderate degree of chronic microvascular angiopathy. Chronic bilateral basal ganglial lacunar infarcts. No acute intracranial abnormality noted. Electronically Signed   By: Ashley Royalty M.D.   On: 06/29/2018 14:24   Ct Head Wo Contrast  Result Date: 06/11/2018 CLINICAL DATA:  Patient status post fall. EXAM: CT HEAD WITHOUT CONTRAST CT CERVICAL SPINE WITHOUT CONTRAST TECHNIQUE: Multidetector CT imaging of the head and cervical spine was performed following the standard protocol without intravenous contrast. Multiplanar CT image reconstructions of the cervical spine were also generated. COMPARISON:  Brain CT 09/23/2017; cervical spine CT 09/22/2017. FINDINGS: CT HEAD FINDINGS Brain: Ventricles and sulci are prominent compatible with atrophy. Periventricular and subcortical white matter hypodensity compatible with chronic microvascular ischemic changes. No evidence for acute cortically based infarct, intracranial hemorrhage, mass lesion or mass-effect. Vascular: Unremarkable. Skull: Slight interval healing of previously described impacted right frontal  skull fracture (image 43; series 4). Sinuses/Orbits: Paranasal sinuses are well aerated. Mastoid air cells unremarkable. Orbits are unremarkable. Other: None. CT CERVICAL SPINE FINDINGS Alignment: Normal anatomic alignment. Skull  base and vertebrae: Multipart mildly displaced fractures through the anterior and posterior arches of the C1 vertebral body. There are at least 2 fractures with mild displacement through the posterior arch of C1 and 1 mildly displaced fracture through the right aspect of the anterior arch of C1. Degenerative changes at the level of the dens. Nondisplaced transverse fracture through the mid aspect of the dens (image 28; series 9). Soft tissues and spinal canal: No acute hematoma within the spinal canal. Prevertebral soft tissues unremarkable. Disc levels: Multilevel degenerative disc disease most pronounced C5-6 and C6-7. Upper chest: Unremarkable Other: None. IMPRESSION: 1. Multiple mildly displaced fractures through the C1 ring. There are at least 2 fractures of the posterior arch and 1 fracture through the anterior arch. 2. Nondisplaced fracture through the mid aspect of the dens. 3. No acute intracranial process. Critical Value/emergent results were called by telephone at the time of interpretation on 06/11/2018 at 5:04 pm to Dr. Lacretia Leigh , who verbally acknowledged these results. Electronically Signed   By: Lovey Newcomer M.D.   On: 06/11/2018 17:12   Ct Cervical Spine Wo Contrast  Result Date: 06/11/2018 CLINICAL DATA:  Patient status post fall. EXAM: CT HEAD WITHOUT CONTRAST CT CERVICAL SPINE WITHOUT CONTRAST TECHNIQUE: Multidetector CT imaging of the head and cervical spine was performed following the standard protocol without intravenous contrast. Multiplanar CT image reconstructions of the cervical spine were also generated. COMPARISON:  Brain CT 09/23/2017; cervical spine CT 09/22/2017. FINDINGS: CT HEAD FINDINGS Brain: Ventricles and sulci are prominent compatible with atrophy. Periventricular and subcortical white matter hypodensity compatible with chronic microvascular ischemic changes. No evidence for acute cortically based infarct, intracranial hemorrhage, mass lesion or mass-effect. Vascular:  Unremarkable. Skull: Slight interval healing of previously described impacted right frontal skull fracture (image 43; series 4). Sinuses/Orbits: Paranasal sinuses are well aerated. Mastoid air cells unremarkable. Orbits are unremarkable. Other: None. CT CERVICAL SPINE FINDINGS Alignment: Normal anatomic alignment. Skull base and vertebrae: Multipart mildly displaced fractures through the anterior and posterior arches of the C1 vertebral body. There are at least 2 fractures with mild displacement through the posterior arch of C1 and 1 mildly displaced fracture through the right aspect of the anterior arch of C1. Degenerative changes at the level of the dens. Nondisplaced transverse fracture through the mid aspect of the dens (image 28; series 9). Soft tissues and spinal canal: No acute hematoma within the spinal canal. Prevertebral soft tissues unremarkable. Disc levels: Multilevel degenerative disc disease most pronounced C5-6 and C6-7. Upper chest: Unremarkable Other: None. IMPRESSION: 1. Multiple mildly displaced fractures through the C1 ring. There are at least 2 fractures of the posterior arch and 1 fracture through the anterior arch. 2. Nondisplaced fracture through the mid aspect of the dens. 3. No acute intracranial process. Critical Value/emergent results were called by telephone at the time of interpretation on 06/11/2018 at 5:04 pm to Dr. Lacretia Leigh , who verbally acknowledged these results. Electronically Signed   By: Lovey Newcomer M.D.   On: 06/11/2018 17:12    Orson Eva, DO  [facility] Pager [phone]  If 7PM-7AM, please contact night-coverage [email or web address] Password [email or web address] 07/03/2018, 1:40 PM   LOS: 3 days

## 2018-07-03 NOTE — Progress Notes (Signed)
Paged on call NP regarding fever of 101.5 axillary. Pt denies any pain or discomfort, not coughing, urine clear & yellow. NP stated to watch the fever for now & placed order for CBC in AM. Tylenol administered. Will recheck temp and let NP know if it worsens or persists. Hortencia Conradi RN

## 2018-07-04 ENCOUNTER — Inpatient Hospital Stay (HOSPITAL_COMMUNITY): Payer: Medicare Other

## 2018-07-04 DIAGNOSIS — A084 Viral intestinal infection, unspecified: Principal | ICD-10-CM

## 2018-07-04 DIAGNOSIS — I4891 Unspecified atrial fibrillation: Secondary | ICD-10-CM

## 2018-07-04 DIAGNOSIS — E43 Unspecified severe protein-calorie malnutrition: Secondary | ICD-10-CM

## 2018-07-04 DIAGNOSIS — Z9181 History of falling: Secondary | ICD-10-CM

## 2018-07-04 DIAGNOSIS — R5381 Other malaise: Secondary | ICD-10-CM

## 2018-07-04 DIAGNOSIS — S129XXA Fracture of neck, unspecified, initial encounter: Secondary | ICD-10-CM

## 2018-07-04 DIAGNOSIS — N183 Chronic kidney disease, stage 3 (moderate): Secondary | ICD-10-CM

## 2018-07-04 LAB — RESPIRATORY PANEL BY PCR
Adenovirus: NOT DETECTED
BORDETELLA PERTUSSIS-RVPCR: NOT DETECTED
CORONAVIRUS 229E-RVPPCR: NOT DETECTED
Chlamydophila pneumoniae: NOT DETECTED
Coronavirus HKU1: NOT DETECTED
Coronavirus NL63: NOT DETECTED
Coronavirus OC43: NOT DETECTED
INFLUENZA A-RVPPCR: NOT DETECTED
INFLUENZA B-RVPPCR: NOT DETECTED
METAPNEUMOVIRUS-RVPPCR: NOT DETECTED
Mycoplasma pneumoniae: NOT DETECTED
PARAINFLUENZA VIRUS 1-RVPPCR: NOT DETECTED
PARAINFLUENZA VIRUS 2-RVPPCR: NOT DETECTED
PARAINFLUENZA VIRUS 4-RVPPCR: NOT DETECTED
Parainfluenza Virus 3: NOT DETECTED
RESPIRATORY SYNCYTIAL VIRUS-RVPPCR: NOT DETECTED
RHINOVIRUS / ENTEROVIRUS - RVPPCR: NOT DETECTED

## 2018-07-04 LAB — CBC
HCT: 37.7 % — ABNORMAL LOW (ref 39.0–52.0)
Hemoglobin: 11.9 g/dL — ABNORMAL LOW (ref 13.0–17.0)
MCH: 31.5 pg (ref 26.0–34.0)
MCHC: 31.6 g/dL (ref 30.0–36.0)
MCV: 99.7 fL (ref 80.0–100.0)
Platelets: 222 10*3/uL (ref 150–400)
RBC: 3.78 MIL/uL — ABNORMAL LOW (ref 4.22–5.81)
RDW: 11.9 % (ref 11.5–15.5)
WBC: 8 10*3/uL (ref 4.0–10.5)
nRBC: 0 % (ref 0.0–0.2)

## 2018-07-04 NOTE — Progress Notes (Signed)
PROGRESS NOTE  IMANI SHERRIN GDJ:[id number] DOB: [birth year] DOA: 06/29/2018 PCP: Leonard Downing, MD  Brief History:  84 y.o. male with medical history significant of dementia, A. fib, CKD 3, BPH presenting to the hospital for evaluation of nausea, vomiting, and decreased p.o. Intake.  Per family at bedside, patient has baseline dementia and is only oriented to self on occasion.  Per family, since his hospital discharge 2 weeks ago for a fall resulting in C1 fracture, has had decreased oral intake, with vomitus that is nonbloody and nonbilious. Pt denies any abdominal pain. Vitals stable on arrival.  No leukocytosis.  Lipase normal.  LFTs normal.  UA not suggestive of infection. CT head showing redemonstration of right depressed frontal skull fracture with C1 ring fractures unchanged in appearance.  Stable moderate sulcal and ventricular prominence with moderate degree of chronic microvascular angiopathy.  Chronic bilateral basal ganglia lacunar infarcts.  No acute intracranial abnormality noted.  CT abdomen pelvis showing colonic diverticulosis without acute diverticulitis or bowel obstruction.  Patient received 2 L IV fluid boluses and IV Zofran in the ED.  TRH patient admitted.   Assessment/Plan: ??Viral gastroenteritis Temp spike on 07/03/18 with 101.5, no luekocytosis BC X 2 pending UC with insignificant growth CXR unremarkable Flu panel pending 06/29/2018 CT abdomen--diverticulosis, left adrenal adenoma, right middle lobe pleural plaque, pectus excavatum Monitor closely for further temp spike  Failure to thrive in adult/severe protein calorie malnutrition -Continue Ensure twice daily -pt continues to have very poor po intake  Atrial fibrillation with slow ventricular response, type unspecified -Presently in sinus rhythm -Poor candidate for anticoagulation secondary to falls -CHADSVASc = 2  CKD stage III -Baseline creatinine 1.5-1.7 -Furosemide was held secondary  to dehydration and failure to thrive with decreased oral intake  C1 neck cervical spine fracture -Patient followed up with neurosurgery 06/27/2018--> continue cervical collar x3 months -Patient has follow-up appointment with neurosurgery in the office 07/14/2018  BPH -Continue tamsulosin  Dementia with behavioral disturbance -Continue Haldol as needed agitation -Family at bedside states that the patient is at his usual baseline mental status  Incidental adrenal nodule -CT abdomen pelvis showing minimally large hypodense left adrenal nodule compatible with an adenoma measuring approximately 3.4 x 2.2 cm currently. -Patient will need outpatient follow-up with PCP  Physical deconditioning -PT eval-->SNF    Disposition Plan:   SNF, once bed available  Family Communication: Son at bedside updated 11/4  Consultants:  None  Code Status:  DNR  DVT Prophylaxis:   [place] Lovenox   Procedures: None  Antibiotics: None    Subjective: Patient currently denies any new complaints, although doesn't say much. Noted some temp spike yesterday. Still with poor appetite.   Objective: Vitals:   07/03/18 2039 07/03/18 2230 07/04/18 0453 07/04/18 1427  BP:  123/72 126/74 95/62  Pulse:  72 80 77  Resp:  14 16 16   Temp: (!) 101.5 F (38.6 C) 98.4 F (36.9 C) 98.1 F (36.7 C) 98.4 F (36.9 C)  TempSrc: Axillary Axillary Oral Oral  SpO2:  96% 97% 98%  Weight:      Height:        Intake/Output Summary (Last 24 hours) at 07/04/2018 1538 Last data filed at 07/04/2018 0941 Gross per 24 hour  Intake 240 ml  Output 1250 ml  Net -1010 ml   Weight change:  Exam:  General: NAD, with neck collar  Cardiovascular: S1, S2 present  Respiratory: CTAB, although poor respiratory  effort  Abdomen: Soft, nontender, nondistended, bowel sounds present  Musculoskeletal: No bilateral pedal edema noted  Skin: Normal  Psychiatry: No new focal neurologic deficits noted    Data Reviewed: I  have personally reviewed following labs and imaging studies Basic Metabolic Panel: Recent Labs  Lab 06/29/18 1105 07/01/18 0412  NA 136 140  K 4.7 4.0  CL 98 111  CO2 29 23  GLUCOSE 103* 112*  BUN 42* 23  CREATININE 1.75* 1.20  CALCIUM 9.6 8.6*  PHOS  --  1.4*   Liver Function Tests: Recent Labs  Lab 06/29/18 1105 07/01/18 0412  AST 29  --   ALT 13  --   ALKPHOS 60  --   BILITOT 0.8  --   PROT 7.8  --   ALBUMIN 3.8 2.9*   Recent Labs  Lab 06/29/18 1105  LIPASE 29   No results for input(s): AMMONIA in the last 168 hours. Coagulation Profile: No results for input(s): INR, PROTIME in the last 168 hours. CBC: Recent Labs  Lab 06/29/18 1105 07/04/18 0520  WBC 6.0 8.0  NEUTROABS 4.7  --   HGB 11.8* 11.9*  HCT 35.9* 37.7*  MCV 96.8 99.7  PLT 268 222   Cardiac Enzymes: No results for input(s): CKTOTAL, CKMB, CKMBINDEX, TROPONINI in the last 168 hours. BNP: Invalid input(s): POCBNP CBG: No results for input(s): GLUCAP in the last 168 hours. HbA1C: No results for input(s): HGBA1C in the last 72 hours. Urine analysis:    Component Value Date/Time   COLORURINE YELLOW 06/29/2018 [place] 06/29/2018 1105   LABSPEC 1.014 06/29/2018 1105   PHURINE 6.0 06/29/2018 1105   GLUCOSEU NEGATIVE 06/29/2018 1105   HGBUR NEGATIVE 06/29/2018 1105   BILIRUBINUR NEGATIVE 06/29/2018 1105   KETONESUR 5 (A) 06/29/2018 1105   PROTEINUR 100 (A) 06/29/2018 1105   NITRITE NEGATIVE 06/29/2018 1105   LEUKOCYTESUR NEGATIVE 06/29/2018 1105   Sepsis Labs: @LABRCNTIP (procalcitonin:4,lacticidven:4) ) Recent Results (from the past 240 hour(s))  Urine culture     Status: Abnormal   Collection Time: 06/29/18 11:05 AM  Result Value Ref Range Status   Specimen Description   Final    URINE, CLEAN CATCH Performed at [facility], [place] [address] [place], [place] [address]    Special Requests   Final    Normal Performed at [facility], [place] [address] [place], [place] [address]    Culture (A)  Final    <10,000 COLONIES/mL INSIGNIFICANT GROWTH Performed at [facility] [address], [place], [place] [address]    Report Status 07/01/2018 FINAL  Final  Culture, blood (routine x 2)     Status: None (Preliminary result)   Collection Time: 07/04/18  8:39 AM  Result Value Ref Range Status   Specimen Description   Final    BLOOD LEFT ANTECUBITAL Performed at [facility] [address], [place], [place] [address]    Special Requests   Final    BOTTLES DRAWN AEROBIC AND ANAEROBIC Blood Culture adequate volume Performed at [facility] [address] [place], [place] [address]    Culture   Final    NO GROWTH < 12 HOURS Performed at [facility] [address] [place], [place] [address]    Report Status PENDING  Incomplete  Culture, blood (routine x 2)     Status: None (Preliminary result)   Collection Time: 07/04/18  8:46 AM  Result Value Ref  Range Status   Specimen Description   Final    BLOOD RIGHT ANTECUBITAL Performed at [facility] [address] [place], [place] [address]    Special Requests   Final    BOTTLES DRAWN AEROBIC AND ANAEROBIC Blood Culture adequate volume Performed at [facility] [address], [place], [place] [address]    Culture   Final    NO GROWTH < 12 HOURS Performed at [facility] [address] [place], [place] [address]    Report Status PENDING  Incomplete     Scheduled Meds: . enoxaparin (LOVENOX) injection  30 mg Subcutaneous Q24H  . feeding supplement (ENSURE ENLIVE)  237 mL Oral BID BM  . hydrOXYzine  25 mg Oral QHS  . latanoprost  1 drop Both Eyes QHS  . LORazepam  0.5 mg Intravenous Once  . tamsulosin  0.4 mg Oral QHS  . vitamin B-12  1,000 mcg Oral Daily   Continuous Infusions:  Procedures/Studies: Ct Abdomen Pelvis Wo Contrast  Result Date:  06/29/2018 CLINICAL DATA:  Nausea and vomiting EXAM: CT ABDOMEN AND PELVIS WITHOUT CONTRAST TECHNIQUE: Multidetector CT imaging of the abdomen and pelvis was performed following the standard protocol without IV contrast. COMPARISON:  03/26/2016 FINDINGS: Lower chest: Pectus excavatum configuration with cardiomegaly. Aortic atherosclerosis is noted. No significant coronary arteriosclerosis of the included heart. No pericardial effusion or thickening. Chronic age related interstitial change of the lungs with atelectasis at each lung base. Calcified pleural density in the right middle lobe compatible with calcified plaque. Hepatobiliary: The unenhanced liver is unremarkable. Physiologic distention of the gallbladder without stones. No pericholecystic fluid or secondary signs of acute cholecystitis. Pancreas: Normal Spleen: Normal Adrenals/Urinary Tract: Hypodense left adrenal nodule measuring 3.4 x 2.2 cm on series 2/18 is minimally larger but without other significant change. Right adrenal gland is normal. Simple appearing bilateral renal cysts. No nephrolithiasis nor obstructive uropathy. The urinary bladder is unremarkable. Stomach/Bowel: Nonobstructed, nondistended bowel loops. Stomach is unremarkable. Scattered colonic diverticulosis without acute diverticulitis along the descending and sigmoid colon. Additional scattered right-sided colonic diverticula are present without inflammatory change. No acute inflammatory process identified. Appendix not well visualized and may be surgically absent. Vascular/Lymphatic: Mild to moderate aortoiliac atherosclerosis without aneurysm. No adenopathy. Reproductive: Prostate is top-normal size. Other: No free air or free fluid. Musculoskeletal: Right femoral nail fixation hardware is intact. Degenerative disc and lumbar facet arthropathy. Prior L1 kyphoplasty. No aggressive osseous lesions or acute fracture. Degenerative change along the dorsal spine, both hips, sacroiliac  joints and pubic symphysis. IMPRESSION: 1. Colonic diverticulosis without acute diverticulitis or bowel obstruction. 2. Minimally larger hypodense left adrenal nodule compatible with an adenoma measuring approximately 3.4 x 2.2 cm currently. 3. Calcified pleural plaque overlying the right middle lobe. 4. Pectus excavatum stable cardiomegaly. Electronically Signed   By: Ashley Royalty M.D.   On: 06/29/2018 14:18   Ct Head Wo Contrast  Result Date: 06/29/2018 CLINICAL DATA:  Dementia EXAM: CT HEAD WITHOUT CONTRAST TECHNIQUE: Contiguous axial images were obtained from the base of the skull through the vertex without intravenous contrast. COMPARISON:  06/11/2018 FINDINGS: BRAIN: There is sulcal and ventricular prominence consistent with superficial and central atrophy. No intraparenchymal hemorrhage, mass effect nor midline shift. Periventricular and subcortical white matter hypodensities consistent with chronic small vessel ischemic disease are identified. No acute large vascular territory infarcts. No abnormal extra-axial fluid collections. Basal cisterns are not effaced and midline. VASCULAR: Moderate calcific atherosclerosis of the carotid siphons. SKULL: No  skull fracture. No significant scalp soft tissue swelling. Redemonstration of slightly depressed impacted right frontal skull fracture without significant change, series 3/13. Redemonstration of fractures involving the right anterolateral and left posterolateral C1 ring. SINUSES/ORBITS: The mastoid air-cells are clear. The included paranasal sinuses are well-aerated.The included ocular globes and orbital contents are non-suspicious. OTHER: None. IMPRESSION: 1. Redemonstration of right depressed frontal skull fracture with C1 ring fractures unchanged in appearance. 2. Stable moderate sulcal and ventricular prominence with moderate degree of chronic microvascular angiopathy. Chronic bilateral basal ganglial lacunar infarcts. No acute intracranial abnormality  noted. Electronically Signed   By: Ashley Royalty M.D.   On: 06/29/2018 14:24   Ct Head Wo Contrast  Result Date: 06/11/2018 CLINICAL DATA:  Patient status post fall. EXAM: CT HEAD WITHOUT CONTRAST CT CERVICAL SPINE WITHOUT CONTRAST TECHNIQUE: Multidetector CT imaging of the head and cervical spine was performed following the standard protocol without intravenous contrast. Multiplanar CT image reconstructions of the cervical spine were also generated. COMPARISON:  Brain CT 09/23/2017; cervical spine CT 09/22/2017. FINDINGS: CT HEAD FINDINGS Brain: Ventricles and sulci are prominent compatible with atrophy. Periventricular and subcortical white matter hypodensity compatible with chronic microvascular ischemic changes. No evidence for acute cortically based infarct, intracranial hemorrhage, mass lesion or mass-effect. Vascular: Unremarkable. Skull: Slight interval healing of previously described impacted right frontal skull fracture (image 43; series 4). Sinuses/Orbits: Paranasal sinuses are well aerated. Mastoid air cells unremarkable. Orbits are unremarkable. Other: None. CT CERVICAL SPINE FINDINGS Alignment: Normal anatomic alignment. Skull base and vertebrae: Multipart mildly displaced fractures through the anterior and posterior arches of the C1 vertebral body. There are at least 2 fractures with mild displacement through the posterior arch of C1 and 1 mildly displaced fracture through the right aspect of the anterior arch of C1. Degenerative changes at the level of the dens. Nondisplaced transverse fracture through the mid aspect of the dens (image 28; series 9). Soft tissues and spinal canal: No acute hematoma within the spinal canal. Prevertebral soft tissues unremarkable. Disc levels: Multilevel degenerative disc disease most pronounced C5-6 and C6-7. Upper chest: Unremarkable Other: None. IMPRESSION: 1. Multiple mildly displaced fractures through the C1 ring. There are at least 2 fractures of the posterior  arch and 1 fracture through the anterior arch. 2. Nondisplaced fracture through the mid aspect of the dens. 3. No acute intracranial process. Critical Value/emergent results were called by telephone at the time of interpretation on 06/11/2018 at 5:04 pm to Dr. Lacretia Leigh , who verbally acknowledged these results. Electronically Signed   By: Lovey Newcomer M.D.   On: 06/11/2018 17:12   Ct Cervical Spine Wo Contrast  Result Date: 06/11/2018 CLINICAL DATA:  Patient status post fall. EXAM: CT HEAD WITHOUT CONTRAST CT CERVICAL SPINE WITHOUT CONTRAST TECHNIQUE: Multidetector CT imaging of the head and cervical spine was performed following the standard protocol without intravenous contrast. Multiplanar CT image reconstructions of the cervical spine were also generated. COMPARISON:  Brain CT 09/23/2017; cervical spine CT 09/22/2017. FINDINGS: CT HEAD FINDINGS Brain: Ventricles and sulci are prominent compatible with atrophy. Periventricular and subcortical white matter hypodensity compatible with chronic microvascular ischemic changes. No evidence for acute cortically based infarct, intracranial hemorrhage, mass lesion or mass-effect. Vascular: Unremarkable. Skull: Slight interval healing of previously described impacted right frontal skull fracture (image 43; series 4). Sinuses/Orbits: Paranasal sinuses are well aerated. Mastoid air cells unremarkable. Orbits are unremarkable. Other: None. CT CERVICAL SPINE FINDINGS Alignment: Normal anatomic alignment. Skull base and vertebrae: Multipart mildly displaced fractures through the  anterior and posterior arches of the C1 vertebral body. There are at least 2 fractures with mild displacement through the posterior arch of C1 and 1 mildly displaced fracture through the right aspect of the anterior arch of C1. Degenerative changes at the level of the dens. Nondisplaced transverse fracture through the mid aspect of the dens (image 28; series 9). Soft tissues and spinal canal:  No acute hematoma within the spinal canal. Prevertebral soft tissues unremarkable. Disc levels: Multilevel degenerative disc disease most pronounced C5-6 and C6-7. Upper chest: Unremarkable Other: None. IMPRESSION: 1. Multiple mildly displaced fractures through the C1 ring. There are at least 2 fractures of the posterior arch and 1 fracture through the anterior arch. 2. Nondisplaced fracture through the mid aspect of the dens. 3. No acute intracranial process. Critical Value/emergent results were called by telephone at the time of interpretation on 06/11/2018 at 5:04 pm to Dr. Lacretia Leigh , who verbally acknowledged these results. Electronically Signed   By: Lovey Newcomer M.D.   On: 06/11/2018 17:12   Dg Chest Port 1 View  Result Date: 07/04/2018 CLINICAL DATA:  Fever. EXAM: PORTABLE CHEST 1 VIEW COMPARISON:  Chest x-ray dated December 17, 2016. FINDINGS: Stable cardiomegaly. Normal pulmonary vascularity. Atherosclerotic calcification of the aortic arch. No focal consolidation, pleural effusion, or pneumothorax. Scattered calcified granulomas in both lungs are unchanged. No acute osseous abnormality. Prior kyphoplasty at the thoracolumbar junction IMPRESSION: No active disease. Electronically Signed   By: Titus Dubin M.D.   On: 07/04/2018 11:05    Alma Friendly, MD  [facility]  If 7PM-7AM, please contact night-coverage [email or web address] 07/04/2018, 3:38 PM   LOS: 4 days

## 2018-07-04 NOTE — NC FL2 (Signed)
[place] LEVEL OF CARE SCREENING TOOL     IDENTIFICATION  Patient Name: Patrick Walker Birthdate: 08/07/1925 Sex: male Admission Date (Current Location): 06/29/2018  [facility] and [place] Number:  Herbalist and Address:  [facility],  [place], [place]      Provider Number: [id number]  Attending Physician Name and Address:  Alma Friendly, MD  Relative Name and Phone Number:       Current Level of Care: Hospital Recommended Level of Care: [place] Prior Approval Number:    Date Approved/Denied:   PASRR Number: [id number]  Discharge Plan: SNF    Current Diagnoses: Patient Active Problem List   Diagnosis Date Noted  . Protein-calorie malnutrition, severe 07/01/2018  . Viral gastroenteritis 06/29/2018  . History of fall 06/29/2018  . Atrial fibrillation ([place]) 06/29/2018  . CKD (chronic kidney disease) stage 3, GFR 30-59 ml/min (HCC) 06/29/2018  . BPH (benign prostatic hyperplasia) 06/29/2018  . Insomnia 06/29/2018  . Chronic anemia 06/29/2018  . Physical deconditioning 06/29/2018  . Adrenal nodule ([place]) 06/29/2018  . Neck fracture ([place]) 06/12/2018  . Acute coronary syndrome ([place]) 06/11/2018  . Skull fracture ([place]) 09/22/2017  . Pneumocephalus, traumatic 09/22/2017  . Fall at home, initial encounter 09/22/2017  . Intertrochanteric fracture of right femur ([place]) 12/15/2016  . Malnutrition of moderate degree 02/24/2016  . Acute kidney injury ([place]) 02/23/2016  . Diverticulitis 02/23/2016  . Dehydration 02/23/2016  . Constipation 02/23/2016  . Nausea 02/23/2016  . Generalized weakness 02/23/2016  . Sinus bradycardia   . Long term current use of anticoagulant 08/04/2011  . Aortic valve sclerosis   . Aortic insufficiency   . Atrial flutter ([place])   . TSH (thyroid-stimulating hormone deficiency)   . Hearing loss   . Shortness of breath   . Inguinal hernia - left s/p lap [facility] repair [id number]  03/16/2011    Orientation RESPIRATION BLADDER Height & Weight     Self  Normal Incontinent Weight: 98 lb 8.7 oz (44.7 kg) Height:  5\' 2"  (157.5 cm)  BEHAVIORAL SYMPTOMS/MOOD NEUROLOGICAL BOWEL NUTRITION STATUS      Continent Diet(regular diet)  AMBULATORY STATUS COMMUNICATION OF NEEDS Skin   Limited Assist Verbally Skin abrasions(head laceration )                       Personal Care Assistance Level of Assistance  Bathing, Feeding, Dressing Bathing Assistance: Limited assistance Feeding assistance: Limited assistance Dressing Assistance: Limited assistance     Functional Limitations Info  Sight, Hearing, Speech Sight Info: Adequate Hearing Info: Adequate Speech Info: Adequate    SPECIAL CARE FACTORS FREQUENCY  PT (By licensed PT), OT (By licensed OT)     PT Frequency: 5x OT Frequency: 5x            Contractures Contractures Info: Not present    Additional Factors Info  Code Status, Allergies Code Status Info: DNR Allergies Info: Codeine, Morphine And Related           Current Medications (07/04/2018):  This is the current hospital active medication list Current Facility-Administered Medications  Medication Dose Route Frequency Provider Last Rate Last Dose  . acetaminophen (TYLENOL) tablet 325 mg  325 mg Oral Q6H PRN Shela Leff, MD   325 mg at 07/03/18 2043  . enoxaparin (LOVENOX) injection 30 mg  30 mg Subcutaneous Q24H Shela Leff, MD   30 mg at 07/03/18 2043  . feeding supplement (ENSURE ENLIVE) (  ENSURE ENLIVE) liquid 237 mL  237 mL Oral BID BM Purohit, Shrey C, MD   237 mL at 07/03/18 0949  . haloperidol lactate (HALDOL) injection 2 mg  2 mg Intramuscular Q6H PRN Purohit, Shrey C, MD   2 mg at 07/01/18 1810  . hydrOXYzine (ATARAX/VISTARIL) tablet 25 mg  25 mg Oral QHS Shela Leff, MD   25 mg at 07/03/18 2043  . latanoprost (XALATAN) 0.005 % ophthalmic solution 1 drop  1 drop Both Eyes QHS Shela Leff, MD   1 drop at 07/03/18  2044  . loperamide (IMODIUM) capsule 2 mg  2 mg Oral QID PRN Shela Leff, MD      . LORazepam (ATIVAN) injection 0.5 mg  0.5 mg Intravenous Once Bodenheimer, Charles A, NP      . ondansetron (ZOFRAN) injection 4 mg  4 mg Intravenous Q6H PRN Shela Leff, MD      . tamsulosin (FLOMAX) capsule 0.4 mg  0.4 mg Oral QHS Shela Leff, MD   0.4 mg at 07/03/18 2043  . traZODone (DESYREL) tablet 75-150 mg  75-150 mg Oral QHS PRN Purohit, Konrad Dolores, MD   75 mg at 07/03/18 2043  . vitamin B-12 (CYANOCOBALAMIN) tablet 1,000 mcg  1,000 mcg Oral Daily Shela Leff, MD   1,000 mcg at 07/03/18 5916     Discharge Medications: Please see discharge summary for a list of discharge medications.  Relevant Imaging Results:  Relevant Lab Results:   Additional Information SS # [id number]  Nila Nephew, LCSW

## 2018-07-04 NOTE — Care Management Important Message (Signed)
Important Message  Patient Details  Name: GUADALUPE NICKLESS MRN: [id number] Date of Birth: 1925/05/07   Medicare Important Message Given:  Yes    Kerin Salen 07/04/2018, 11:32 AMImportant Message  Patient Details  Name: DMARIUS REEDER MRN: [id number] Date of Birth: 08/08/1925   Medicare Important Message Given:  Yes    Kerin Salen 07/04/2018, 11:32 AM

## 2018-07-04 NOTE — Evaluation (Signed)
Clinical/Bedside Swallow Evaluation Patient Details  Name: Patrick Walker MRN: [id number] Date of Birth: 1925/08/18  Today's Date: 07/04/2018 Time: SLP Start Time (ACUTE ONLY): 1430 SLP Stop Time (ACUTE ONLY): 1450 SLP Time Calculation (min) (ACUTE ONLY): 20 min  Past Medical History:  Past Medical History:  Diagnosis Date  . Aortic insufficiency    Mild, echo, November, 2011  . Aortic valve sclerosis    Echo, November, 2011  . Arthritis    "joints probably" (09/23/2017)  . Atrial fibrillation ([place])   . Atrial flutter [facility])    New diagnosis November, 2012  . CKD (chronic kidney disease), stage III ([place])    [name] 09/23/2017  . Difficulty in urination   . Ejection fraction    EF 55-60%, echo, November, 2012 ( rapid atrial flutter at that time)  . Fall from standing 09/22/2017   sustained a open depressed frontal skull fracture with some pneumocephalus. [name] 09/23/2017  . Glaucoma   . Hearing loss   . Irritable bowel disease   . Sinus bradycardia    Past Surgical History:  Past Surgical History:  Procedure Laterality Date  . BACK SURGERY  2008-2009  . CATARACT EXTRACTION W/ INTRAOCULAR LENS  IMPLANT, BILATERAL Bilateral   . FRACTURE SURGERY    . HAND SURGERY Right    "chainsaw about cut it off"  . INGUINAL HERNIA REPAIR  03/02/2011  . INTRAMEDULLARY (IM) NAIL INTERTROCHANTERIC Right 12/15/2016   Procedure: INTRAMEDULLARY (IM) NAIL INTERTROCHANTRIC;  Surgeon: Renette Butters, MD;  Location: [place];  Service: Orthopedics;  Laterality: Right;  . LEG SURGERY Left 1972   "crushed"   HPI:  84 year old male admitted 06/29/18 from home with nausea/vomiting and decreased po intake. PMH: dementia, fall with C1 fracture (10/12-14/19), aortic insufficiency, AFib, AFlutter, CKD, hearing loss.  CXR negative. MRI pending.   Assessment / Plan / Recommendation Clinical Impression  Pt's son reports poor appetite recently, but no coughing or choking at mealtime. Pt accepted trials of ice  chips, thin liquid, puree, and soft solid. Extended oral prep of solid was noted, which raises concern for increased aspiration risk with fatigue. No overt s/s aspiration observed on any consistency.   Pt has had decreased appetite recently, and while dys 1 or dys 2 may be beneficial for energy conservation, such a diet will significantly restrict his po choices, further reducing the likelihod of adequate po intake.   At this time, recommend continuing with regular diet (pt's son reports pt does not eat meat, and hasn't eaten meat in 40 years, so will order vegetarian diet) and thin liquids. Pt will require assistance to cut foods in to bite size pieces, and may require assistance with feeding given advanced dementia. Safe swallow precautions were posted at [facility] and reviewed with pt's son.  ST will follow for assessment of diet tolerance and education. RN and MD informed of results and recommendations.   SLP Visit Diagnosis: Dysphagia, unspecified (R13.10)    Aspiration Risk  Moderate aspiration risk;Mild aspiration risk    Diet Recommendation Regular;Thin liquid(vegetarian - pt does not eat meat.)   Liquid Administration via: Cup;Straw Medication Administration: (as tolerated - large pills may be best given in puree.) Supervision: Patient able to self feed;Staff to assist with self feeding;Full supervision/cueing for compensatory strategies Compensations: Minimize environmental distractions;Slow rate;Small sips/bites Postural Changes: Seated upright at 90 degrees;Remain upright for at least 30 minutes after po intake    Other  Recommendations Oral Care Recommendations: Oral care QID   Follow  up Recommendations 24 hour supervision/assistance      Frequency and Duration min 1 x/week  1 week;2 weeks       Prognosis Prognosis for Safe Diet Advancement: Good Barriers to Reach Goals: Cognitive deficits      Swallow Study   General Date of Onset: 06/29/18 HPI: 84 year old male admitted  06/29/18 from home with nausea/vomiting and decreased po intake. PMH: dementia, fall with C1 fracture (10/12-14/19), aortic insufficiency, AFib, AFlutter, CKD, hearing loss.  CXR negative. MRI pending. Type of Study: Bedside Swallow Evaluation Previous Swallow Assessment: none Diet Prior to this Study: Regular;Thin liquids Temperature Spikes Noted: Yes(101 on 11/3) Respiratory Status: Room air History of Recent Intubation: No Behavior/Cognition: Alert Oral Cavity Assessment: Dry Oral Care Completed by SLP: No Oral Cavity - Dentition: Adequate natural dentition Vision: Functional for self-feeding Self-Feeding Abilities: Total assist Patient Positioning: Upright in chair Baseline Vocal Quality: Low vocal intensity Volitional Cough: Cognitively unable to elicit Volitional Swallow: Unable to elicit    Oral/Motor/Sensory Function Overall Oral Motor/Sensory Function: Generalized oral weakness   Ice Chips Ice chips: Within functional limits Presentation: Spoon   Thin Liquid Thin Liquid: Within functional limits Presentation: Straw    Nectar Thick Nectar Thick Liquid: Not tested   Honey Thick Honey Thick Liquid: Not tested   Puree Puree: Within functional limits Presentation: Spoon   Solid     Solid: Impaired Oral Phase Functional Implications: Prolonged oral transit     Alnita Aybar B. Quentin Ore, [facility], [place] Speech Language Pathologist  Shonna Chock 07/04/2018,3:01 PM

## 2018-07-04 NOTE — Progress Notes (Signed)
Physical Therapy Treatment Patient Details Name: Patrick Walker MRN: [id number] DOB: [birth year] Today's Date: 07/04/2018    History of Present Illness Patient is a 84 year old male recently admitted for syncope and found to have Multiple mildly displaced fractures through the C1 ring and Nondisplaced fracture through the mid aspect of the dens. Pt presented to [facility] 06/29/18 for nausea and vomiting and found to have viral gastroenteritis.  PMH includes hearing loss, dementia, glaucoma, CKD, A-fib, sinus bradycardia, aortic insufficiency.     PT Comments    Pt was able to allow PT to assist him to transfer to the chair and posture to allow him to eat a few bites.  Pt is progressing minimally due to his lethargic state today and will continue as his affect allows.  Pt is able to take steps on better days but did not do more than shuffle to the chair today.  PT continue acutely for strength, balance and standing balance control to benefit gait.   Follow Up Recommendations  SNF     Equipment Recommendations  None recommended by PT    Recommendations for Other Services OT consult     Precautions / Restrictions Precautions Precautions: Cervical;Fall Required Braces or Orthoses: Cervical Brace Cervical Brace: Hard collar;At all times Restrictions Weight Bearing Restrictions: Yes    Mobility  Bed Mobility Overal bed mobility: Needs Assistance Bed Mobility: Rolling;Sidelying to Sit Rolling: Max assist Sidelying to sit: Max assist       General bed mobility comments: pt did not initiate so assisted him to sit up   Transfers Overall transfer level: Needs assistance Equipment used: 1 person hand held assist Transfers: Sit to/from Stand Sit to Stand: Mod assist         General transfer comment: pt was mitted but had pulled out cath three times in last day.  Left mitts on and did a sidestep transfer to chair, unable to walk due to lethargy  Ambulation/Gait                  Stairs             Wheelchair Mobility    Modified Rankin (Stroke Patients Only)       Balance Overall balance assessment: Needs assistance Sitting-balance support: Feet supported;Single extremity supported Sitting balance-Leahy Scale: Fair Sitting balance - Comments: fair once set   Standing balance support: Bilateral upper extremity supported;During functional activity Standing balance-Leahy Scale: Poor Standing balance comment: pt is somewhat helpful to support standing but is weak today                            Cognition Arousal/Alertness: Lethargic Behavior During Therapy: Flat affect Overall Cognitive Status: History of cognitive impairments - at baseline Area of Impairment: Following commands;Safety/judgement;Awareness;Problem solving                   Current Attention Level: Selective Memory: Decreased recall of precautions Following Commands: Follows one step commands with increased time Safety/Judgement: Decreased awareness of safety;Decreased awareness of deficits Awareness: Intellectual Problem Solving: Slow processing;Requires verbal cues;Requires tactile cues General Comments: Pt was able to sit with cues and could shuffle step to chair, had meds for his agitation and was still a bit lethargic      Exercises      General Comments General comments (skin integrity, edema, etc.): sat up propped on chair with pillow to make spine neutral, then he was able to  drink juice with assist.  Had some coughing at the end of drinking so left word with nursing to see if Speech therapy could be consulted      Pertinent Vitals/Pain Pain Assessment: No/denies pain    Home Living                      Prior Function            PT Goals (current goals can now be found in the care plan section) Acute Rehab PT Goals Patient Stated Goal: none stated Progress towards PT goals: Not progressing toward goals - comment(having a  lethargic day)    Frequency    Min 2X/week      PT Plan Current plan remains appropriate    Co-evaluation              AM-PAC PT "6 Clicks" Daily Activity  Outcome Measure  Difficulty turning over in bed (including adjusting bedclothes, sheets and blankets)?: Unable Difficulty moving from lying on back to sitting on the side of the bed? : Unable Difficulty sitting down on and standing up from a chair with arms (e.g., wheelchair, bedside commode, etc,.)?: Unable Help needed moving to and from a bed to chair (including a wheelchair)?: A Lot Help needed walking in hospital room?: A Lot Help needed climbing 3-5 steps with a railing? : Total 6 Click Score: 8    End of Session Equipment Utilized During Treatment: Gait belt;Cervical collar Activity Tolerance: Patient tolerated treatment well;Patient limited by fatigue;Patient limited by lethargy Patient left: in chair;with call bell/phone within reach;with chair alarm set;with family/visitor present Nurse Communication: Mobility status;Other (comment)(request to consider ST referral) PT Visit Diagnosis: Muscle weakness (generalized) (M62.81);Unsteadiness on feet (R26.81)     Time: 0034-9179 PT Time Calculation (min) (ACUTE ONLY): 27 min  Charges:  $Therapeutic Activity: 23-37 mins                  Ramond Dial 07/04/2018, 12:55 PM   Mee Hives, PT MS Acute Rehab Dept. Number: [place] [facility] [place]

## 2018-07-04 NOTE — Progress Notes (Addendum)
Followed up with pt's sons Selinda Flavin and Elta Guadeloupe this morning re: their wishes for pt's disposition- still hoping for pt to admit to SNF for rehab. Also planning long term to look into ALF placement. Made referrals to area SNFs, will complete FL2 as well and follow up with bed offers.   Sharren Bridge, MSW, LCSW Clinical Social Work 07/04/2018 [phone]  Bed offers provided to son Elta Guadeloupe- selects [facility]. Confirmed with admissions. Will assist with transition to SNF at [facility].

## 2018-07-05 DIAGNOSIS — D649 Anemia, unspecified: Secondary | ICD-10-CM

## 2018-07-05 LAB — BASIC METABOLIC PANEL
ANION GAP: 10 (ref 5–15)
BUN: 31 mg/dL — ABNORMAL HIGH (ref 8–23)
CALCIUM: 8.6 mg/dL — AB (ref 8.9–10.3)
CO2: 27 mmol/L (ref 22–32)
Chloride: 100 mmol/L (ref 98–111)
Creatinine, Ser: 1.66 mg/dL — ABNORMAL HIGH (ref 0.61–1.24)
GFR, EST AFRICAN AMERICAN: 40 mL/min — AB (ref >60)
GFR, EST NON AFRICAN AMERICAN: 34 mL/min — AB (ref >60)
Glucose, Bld: 88 mg/dL (ref 70–99)
Potassium: 4.3 mmol/L (ref 3.5–5.1)
Sodium: 137 mmol/L (ref 135–145)

## 2018-07-05 LAB — CBC WITH DIFFERENTIAL/PLATELET
Abs Immature Granulocytes: 0.07 10*3/uL (ref 0.00–0.07)
BASOS PCT: 0 %
Basophils Absolute: 0 10*3/uL (ref 0.0–0.1)
EOS ABS: 0 10*3/uL (ref 0.0–0.5)
Eosinophils Relative: 1 %
HCT: 31.9 % — ABNORMAL LOW (ref 39.0–52.0)
Hemoglobin: 10.3 g/dL — ABNORMAL LOW (ref 13.0–17.0)
IMMATURE GRANULOCYTES: 1 %
LYMPHS ABS: 1.2 10*3/uL (ref 0.7–4.0)
Lymphocytes Relative: 19 %
MCH: 32 pg (ref 26.0–34.0)
MCHC: 32.3 g/dL (ref 30.0–36.0)
MCV: 99.1 fL (ref 80.0–100.0)
Monocytes Absolute: 0.8 10*3/uL (ref 0.1–1.0)
Monocytes Relative: 13 %
NEUTROS PCT: 66 %
Neutro Abs: 4.3 10*3/uL (ref 1.7–7.7)
PLATELETS: 201 10*3/uL (ref 150–400)
RBC: 3.22 MIL/uL — ABNORMAL LOW (ref 4.22–5.81)
RDW: 11.9 % (ref 11.5–15.5)
WBC: 6.4 10*3/uL (ref 4.0–10.5)
nRBC: 0 % (ref 0.0–0.2)

## 2018-07-05 MED ORDER — ENSURE ENLIVE PO LIQD: 237.0000 mL | Freq: Two times a day (BID) | ORAL | 12 refills | Status: DC

## 2018-07-05 NOTE — Discharge Summary (Signed)
Discharge Summary  Patrick Walker AST:[id number] DOB: Aug 10, 1925  PCP: Leonard Downing, MD  Admit date: 06/29/2018 Discharge date: 07/05/2018  Time spent: 35 mins  Recommendations for Outpatient Follow-up:  1. PCP in 1 week  Discharge Diagnoses:  Active Hospital Problems   Diagnosis Date Noted  . Viral gastroenteritis 06/29/2018  . Protein-calorie malnutrition, severe 07/01/2018  . History of fall 06/29/2018  . Atrial fibrillation ([place]) 06/29/2018  . CKD (chronic kidney disease) stage 3, GFR 30-59 ml/min (HCC) 06/29/2018  . BPH (benign prostatic hyperplasia) 06/29/2018  . Insomnia 06/29/2018  . Chronic anemia 06/29/2018  . Physical deconditioning 06/29/2018  . Adrenal nodule ([place]) 06/29/2018  . Neck fracture ([place]) 06/12/2018  . Skull fracture ([place]) 09/22/2017    Resolved Hospital Problems  No resolved problems to display.    Discharge Condition: Stable  Diet recommendation: Regular  Vitals:   07/04/18 2042 07/05/18 0452  BP: (!) 145/76 (!) 175/69  Pulse: 76 75  Resp: 18 15  Temp:  98.8 F (37.1 C)  SpO2:  97%    History of present illness:  84 y.o.malewith medical history significant ofdementia,A. fib, CKD 3, BPH presenting to the hospital for evaluation of nausea, vomiting, and decreased p.o. Intake.Per family at bedside, patient has baseline dementia and is only oriented to self on occasion. Per family, since his hospital discharge 2 weeks ago for a fall resulting in C1 fracture, has had decreased oral intake, with vomitus that is nonbloody and nonbilious. Pt denies any abdominal pain. Vitals stable on arrival. No leukocytosis. Lipase normal.LFTs normal.UA not suggestive of infection. CT head showing redemonstration of right depressed frontal skull fracture with C1 ring fractures unchanged in appearance. Stable moderate sulcal and ventricular prominence with moderate degree of chronic microvascular angiopathy. Chronic bilateral basal ganglia  lacunar infarcts. No acute intracranial abnormality noted. CT abdomen pelvis showing colonic diverticulosis without acute diverticulitis or bowel obstruction. Patient received 2 L IV fluid boluses and IV Zofran in the ED. TRH patient admitted.   Today, pt reported feeling better. Denies any new complaints. Pt stable to d/c to SNF.  Hospital Course:  Principal Problem:   Viral gastroenteritis Active Problems:   Skull fracture (HCC)   Neck fracture (HCC)   History of fall   Atrial fibrillation (HCC)   CKD (chronic kidney disease) stage 3, GFR 30-59 ml/min (HCC)   BPH (benign prostatic hyperplasia)   Insomnia   Chronic anemia   Physical deconditioning   Adrenal nodule (HCC)   Protein-calorie malnutrition, severe  ??Viral gastroenteritis Currently afebrile, no luekocytosis, currently asymptomatic BC X 2 NGTD UC with insignificant growth CXR unremarkable Flu panel negative 06/29/2018 CT abdomen--diverticulosis, left adrenal adenoma, right middle lobe pleural plaque, pectus excavatum  Failure to thrive in adult/severe protein calorie malnutrition -Continue Ensure twice daily  Atrial fibrillation with slow ventricular response, type unspecified -Presently in sinus rhythm -Poor candidate for anticoagulation secondary to falls -CHADSVASc = 2  CKD stage III -Baseline creatinine 1.5-1.7 -Furosemide was held secondary to dehydration and failure to thrive with decreased oral intake  C1 neck cervical spine fracture -Patient followed up with neurosurgery 06/27/2018--> continue cervical collar x3 months -Patient has follow-up appointment with neurosurgery in the office 07/14/2018  BPH -Continue tamsulosin  Dementia with behavioral disturbance -Family at bedside states that the patient is at his usual baseline mental status  Incidental adrenal nodule -CT abdomen pelvis showing minimally large hypodense left adrenal nodule compatible with an adenoma measuring  approximately 3.4 x 2.2 cm currently. -Patient will  need outpatient follow-up with PCP  Physical deconditioning -SNF    Procedures:  None   Consultations:  None  Discharge Exam: BP (!) 175/69 (BP Location: Right Leg)   Pulse 75   Temp 98.8 F (37.1 C) (Oral)   Resp 15   Ht 5\' 2"  (1.575 m)   Wt 44.7 kg   SpO2 97%   BMI 18.02 kg/m   General: NAD, with neck collar Cardiovascular: S1, S2 present  Respiratory: Poor respiratory effort  Discharge Instructions You were cared for by a hospitalist during your hospital stay. If you have any questions about your discharge medications or the care you received while you were in the hospital after you are discharged, you can call the unit and asked to speak with the hospitalist on call if the hospitalist that took care of you is not available. Once you are discharged, your primary care physician will handle any further medical issues. Please note that NO REFILLS for any discharge medications will be authorized once you are discharged, as it is imperative that you return to your primary care physician (or establish a relationship with a primary care physician if you do not have one) for your aftercare needs so that they can reassess your need for medications and monitor your lab values.   Allergies as of 07/05/2018      Reactions   Codeine Nausea And Vomiting   Morphine And Related Nausea And Vomiting      Medication List    STOP taking these medications   furosemide 20 MG tablet Commonly known as:  LASIX   polyethylene glycol packet Commonly known as:  MIRALAX / GLYCOLAX     TAKE these medications   feeding supplement (ENSURE ENLIVE) Liqd Take 237 mLs by mouth 2 (two) times daily between meals.   hydrOXYzine 25 MG capsule Commonly known as:  VISTARIL Take 25 mg by mouth at bedtime.   latanoprost 0.005 % ophthalmic solution Commonly known as:  XALATAN Place 1 drop into both eyes at bedtime.   loperamide 2 MG  tablet Commonly known as:  IMODIUM A-D Take 2 mg by mouth 4 (four) times daily as needed for diarrhea or loose stools.   PRENATAL VITAMIN PO Take 1 tablet by mouth at bedtime.   tamsulosin 0.4 MG Caps capsule Commonly known as:  FLOMAX Take 0.4 mg by mouth at bedtime.   traZODone 150 MG tablet Commonly known as:  DESYREL Take 75-150 mg by mouth at bedtime as needed for sleep.   TYLENOL 325 MG tablet Generic drug:  acetaminophen Take 1 tablet by mouth every 6 (six) hours as needed (pain/headache).   vitamin B-12 1000 MCG tablet Commonly known as:  CYANOCOBALAMIN Take 1,000 mcg by mouth daily.      Allergies  Allergen Reactions  . Codeine Nausea And Vomiting  . Morphine And Related Nausea And Vomiting   Follow-up Information    [facility] Follow up.   Specialty:  Home Health Services Why:  Your home health team will help you with moving into a facility by working with your doctor. Contact information: [address] [place] [address] [phone]        Leonard Downing, MD Follow up.   Specialty:  Family Medicine Contact information: [place] [address] [phone]            The results of significant diagnostics from this hospitalization (including imaging, microbiology, ancillary and laboratory) are listed below  for reference.    Significant Diagnostic Studies: Ct Abdomen Pelvis Wo Contrast  Result Date: 06/29/2018 CLINICAL DATA:  Nausea and vomiting EXAM: CT ABDOMEN AND PELVIS WITHOUT CONTRAST TECHNIQUE: Multidetector CT imaging of the abdomen and pelvis was performed following the standard protocol without IV contrast. COMPARISON:  03/26/2016 FINDINGS: Lower chest: Pectus excavatum configuration with cardiomegaly. Aortic atherosclerosis is noted. No significant coronary arteriosclerosis of the included heart. No pericardial effusion or thickening. Chronic age related interstitial change of the lungs  with atelectasis at each lung base. Calcified pleural density in the right middle lobe compatible with calcified plaque. Hepatobiliary: The unenhanced liver is unremarkable. Physiologic distention of the gallbladder without stones. No pericholecystic fluid or secondary signs of acute cholecystitis. Pancreas: Normal Spleen: Normal Adrenals/Urinary Tract: Hypodense left adrenal nodule measuring 3.4 x 2.2 cm on series 2/18 is minimally larger but without other significant change. Right adrenal gland is normal. Simple appearing bilateral renal cysts. No nephrolithiasis nor obstructive uropathy. The urinary bladder is unremarkable. Stomach/Bowel: Nonobstructed, nondistended bowel loops. Stomach is unremarkable. Scattered colonic diverticulosis without acute diverticulitis along the descending and sigmoid colon. Additional scattered right-sided colonic diverticula are present without inflammatory change. No acute inflammatory process identified. Appendix not well visualized and may be surgically absent. Vascular/Lymphatic: Mild to moderate aortoiliac atherosclerosis without aneurysm. No adenopathy. Reproductive: Prostate is top-normal size. Other: No free air or free fluid. Musculoskeletal: Right femoral nail fixation hardware is intact. Degenerative disc and lumbar facet arthropathy. Prior L1 kyphoplasty. No aggressive osseous lesions or acute fracture. Degenerative change along the dorsal spine, both hips, sacroiliac joints and pubic symphysis. IMPRESSION: 1. Colonic diverticulosis without acute diverticulitis or bowel obstruction. 2. Minimally larger hypodense left adrenal nodule compatible with an adenoma measuring approximately 3.4 x 2.2 cm currently. 3. Calcified pleural plaque overlying the right middle lobe. 4. Pectus excavatum stable cardiomegaly. Electronically Signed   By: Ashley Royalty M.D.   On: 06/29/2018 14:18   Ct Head Wo Contrast  Result Date: 06/29/2018 CLINICAL DATA:  Dementia EXAM: CT HEAD WITHOUT  CONTRAST TECHNIQUE: Contiguous axial images were obtained from the base of the skull through the vertex without intravenous contrast. COMPARISON:  06/11/2018 FINDINGS: BRAIN: There is sulcal and ventricular prominence consistent with superficial and central atrophy. No intraparenchymal hemorrhage, mass effect nor midline shift. Periventricular and subcortical white matter hypodensities consistent with chronic small vessel ischemic disease are identified. No acute large vascular territory infarcts. No abnormal extra-axial fluid collections. Basal cisterns are not effaced and midline. VASCULAR: Moderate calcific atherosclerosis of the carotid siphons. SKULL: No skull fracture. No significant scalp soft tissue swelling. Redemonstration of slightly depressed impacted right frontal skull fracture without significant change, series 3/13. Redemonstration of fractures involving the right anterolateral and left posterolateral C1 ring. SINUSES/ORBITS: The mastoid air-cells are clear. The included paranasal sinuses are well-aerated.The included ocular globes and orbital contents are non-suspicious. OTHER: None. IMPRESSION: 1. Redemonstration of right depressed frontal skull fracture with C1 ring fractures unchanged in appearance. 2. Stable moderate sulcal and ventricular prominence with moderate degree of chronic microvascular angiopathy. Chronic bilateral basal ganglial lacunar infarcts. No acute intracranial abnormality noted. Electronically Signed   By: Ashley Royalty M.D.   On: 06/29/2018 14:24   Ct Head Wo Contrast  Result Date: 06/11/2018 CLINICAL DATA:  Patient status post fall. EXAM: CT HEAD WITHOUT CONTRAST CT CERVICAL SPINE WITHOUT CONTRAST TECHNIQUE: Multidetector CT imaging of the head and cervical spine was performed following the standard protocol without intravenous contrast. Multiplanar CT image reconstructions of the cervical  spine were also generated. COMPARISON:  Brain CT 09/23/2017; cervical spine CT  09/22/2017. FINDINGS: CT HEAD FINDINGS Brain: Ventricles and sulci are prominent compatible with atrophy. Periventricular and subcortical white matter hypodensity compatible with chronic microvascular ischemic changes. No evidence for acute cortically based infarct, intracranial hemorrhage, mass lesion or mass-effect. Vascular: Unremarkable. Skull: Slight interval healing of previously described impacted right frontal skull fracture (image 43; series 4). Sinuses/Orbits: Paranasal sinuses are well aerated. Mastoid air cells unremarkable. Orbits are unremarkable. Other: None. CT CERVICAL SPINE FINDINGS Alignment: Normal anatomic alignment. Skull base and vertebrae: Multipart mildly displaced fractures through the anterior and posterior arches of the C1 vertebral body. There are at least 2 fractures with mild displacement through the posterior arch of C1 and 1 mildly displaced fracture through the right aspect of the anterior arch of C1. Degenerative changes at the level of the dens. Nondisplaced transverse fracture through the mid aspect of the dens (image 28; series 9). Soft tissues and spinal canal: No acute hematoma within the spinal canal. Prevertebral soft tissues unremarkable. Disc levels: Multilevel degenerative disc disease most pronounced C5-6 and C6-7. Upper chest: Unremarkable Other: None. IMPRESSION: 1. Multiple mildly displaced fractures through the C1 ring. There are at least 2 fractures of the posterior arch and 1 fracture through the anterior arch. 2. Nondisplaced fracture through the mid aspect of the dens. 3. No acute intracranial process. Critical Value/emergent results were called by telephone at the time of interpretation on 06/11/2018 at 5:04 pm to Dr. Lacretia Leigh , who verbally acknowledged these results. Electronically Signed   By: Lovey Newcomer M.D.   On: 06/11/2018 17:12   Ct Cervical Spine Wo Contrast  Result Date: 06/11/2018 CLINICAL DATA:  Patient status post fall. EXAM: CT HEAD  WITHOUT CONTRAST CT CERVICAL SPINE WITHOUT CONTRAST TECHNIQUE: Multidetector CT imaging of the head and cervical spine was performed following the standard protocol without intravenous contrast. Multiplanar CT image reconstructions of the cervical spine were also generated. COMPARISON:  Brain CT 09/23/2017; cervical spine CT 09/22/2017. FINDINGS: CT HEAD FINDINGS Brain: Ventricles and sulci are prominent compatible with atrophy. Periventricular and subcortical white matter hypodensity compatible with chronic microvascular ischemic changes. No evidence for acute cortically based infarct, intracranial hemorrhage, mass lesion or mass-effect. Vascular: Unremarkable. Skull: Slight interval healing of previously described impacted right frontal skull fracture (image 43; series 4). Sinuses/Orbits: Paranasal sinuses are well aerated. Mastoid air cells unremarkable. Orbits are unremarkable. Other: None. CT CERVICAL SPINE FINDINGS Alignment: Normal anatomic alignment. Skull base and vertebrae: Multipart mildly displaced fractures through the anterior and posterior arches of the C1 vertebral body. There are at least 2 fractures with mild displacement through the posterior arch of C1 and 1 mildly displaced fracture through the right aspect of the anterior arch of C1. Degenerative changes at the level of the dens. Nondisplaced transverse fracture through the mid aspect of the dens (image 28; series 9). Soft tissues and spinal canal: No acute hematoma within the spinal canal. Prevertebral soft tissues unremarkable. Disc levels: Multilevel degenerative disc disease most pronounced C5-6 and C6-7. Upper chest: Unremarkable Other: None. IMPRESSION: 1. Multiple mildly displaced fractures through the C1 ring. There are at least 2 fractures of the posterior arch and 1 fracture through the anterior arch. 2. Nondisplaced fracture through the mid aspect of the dens. 3. No acute intracranial process. Critical Value/emergent results were  called by telephone at the time of interpretation on 06/11/2018 at 5:04 pm to Dr. Lacretia Leigh , who verbally acknowledged these results. Electronically  Signed   By: Lovey Newcomer M.D.   On: 06/11/2018 17:12   Dg Chest Port 1 View  Result Date: 07/04/2018 CLINICAL DATA:  Fever. EXAM: PORTABLE CHEST 1 VIEW COMPARISON:  Chest x-ray dated December 17, 2016. FINDINGS: Stable cardiomegaly. Normal pulmonary vascularity. Atherosclerotic calcification of the aortic arch. No focal consolidation, pleural effusion, or pneumothorax. Scattered calcified granulomas in both lungs are unchanged. No acute osseous abnormality. Prior kyphoplasty at the thoracolumbar junction IMPRESSION: No active disease. Electronically Signed   By: Titus Dubin M.D.   On: 07/04/2018 11:05    Microbiology: Recent Results (from the past 240 hour(s))  Urine culture     Status: Abnormal   Collection Time: 06/29/18 11:05 AM  Result Value Ref Range Status   Specimen Description   Final    URINE, CLEAN CATCH Performed at [facility], [place] [address], [place], [place] [address]    Special Requests   Final    Normal Performed at [facility] [place] [address] [place], [place] [address]    Culture (A)  Final    <10,000 COLONIES/mL INSIGNIFICANT GROWTH Performed at [facility] [address], [place], [place] [address]    Report Status 07/01/2018 FINAL  Final  Culture, blood (routine x 2)     Status: None (Preliminary result)   Collection Time: 07/04/18  8:39 AM  Result Value Ref Range Status   Specimen Description   Final    BLOOD LEFT ANTECUBITAL Performed at [place] [address] [place], [place] [address]    Special Requests   Final    BOTTLES DRAWN AEROBIC AND ANAEROBIC Blood Culture adequate volume Performed at [facility] [place] [address] [place], [place] [address]    Culture   Final    NO GROWTH < 12 HOURS Performed at [facility] [address] [place], [place] [address]    Report Status PENDING  Incomplete  Culture, blood (routine x 2)     Status: None (Preliminary result)   Collection Time: 07/04/18  8:46 AM  Result Value Ref Range Status   Specimen Description   Final    BLOOD RIGHT ANTECUBITAL Performed at [facility] [place] [address], [place], [place] [address]    Special Requests   Final    BOTTLES DRAWN AEROBIC AND ANAEROBIC Blood Culture adequate volume Performed at [facility] [address], [place], [place] [address]    Culture   Final    NO GROWTH < 12 HOURS Performed at [facility] [address], [place],  [address]    Report Status PENDING  Incomplete  Respiratory Panel by PCR     Status: None   Collection Time: 07/04/18  5:49 PM  Result Value Ref Range Status   Adenovirus NOT DETECTED NOT DETECTED Final   Coronavirus 229E NOT DETECTED NOT DETECTED Final   Coronavirus HKU1 NOT DETECTED NOT DETECTED Final   Coronavirus NL63 NOT DETECTED NOT DETECTED Final   Coronavirus OC43 NOT DETECTED NOT DETECTED Final   Metapneumovirus NOT DETECTED NOT DETECTED Final   Rhinovirus / Enterovirus NOT DETECTED NOT DETECTED Final   Influenza A NOT DETECTED NOT DETECTED Final   Influenza B NOT DETECTED NOT DETECTED Final   Parainfluenza Virus 1 NOT DETECTED NOT DETECTED Final   Parainfluenza Virus 2 NOT DETECTED NOT DETECTED Final   Parainfluenza Virus 3 NOT DETECTED NOT DETECTED Final  Parainfluenza Virus 4 NOT DETECTED NOT DETECTED Final   Respiratory Syncytial Virus NOT DETECTED NOT DETECTED Final   Bordetella pertussis NOT DETECTED NOT DETECTED Final   Chlamydophila pneumoniae NOT DETECTED NOT DETECTED Final   Mycoplasma pneumoniae NOT DETECTED NOT DETECTED Final    Comment: Performed at [facility], [place] [address], [place], [place] [address]     Labs: Basic Metabolic Panel: Recent Labs  Lab 06/29/18 1105 07/01/18 0412  07/05/18 0513  NA 136 140 137  K 4.7 4.0 4.3  CL 98 111 100  CO2 29 23 27   GLUCOSE 103* 112* 88  BUN 42* 23 31*  CREATININE 1.75* 1.20 1.66*  CALCIUM 9.6 8.6* 8.6*  PHOS  --  1.4*  --    Liver Function Tests: Recent Labs  Lab 06/29/18 1105 07/01/18 0412  AST 29  --   ALT 13  --   ALKPHOS 60  --   BILITOT 0.8  --   PROT 7.8  --   ALBUMIN 3.8 2.9*   Recent Labs  Lab 06/29/18 1105  LIPASE 29   No results for input(s): AMMONIA in the last 168 hours. CBC: Recent Labs  Lab 06/29/18 1105 07/04/18 0520 07/05/18 0513  WBC 6.0 8.0 6.4  NEUTROABS 4.7  --  4.3  HGB 11.8* 11.9* 10.3*  HCT 35.9* 37.7* 31.9*  MCV 96.8 99.7 99.1  PLT 268 222 201   Cardiac Enzymes: No results for input(s): CKTOTAL, CKMB, CKMBINDEX, TROPONINI in the last 168 hours. BNP: BNP (last 3 results) No results for input(s): BNP in the last 8760 hours.  ProBNP (last 3 results) No results for input(s): PROBNP in the last 8760 hours.  CBG: No results for input(s): GLUCAP in the last 168 hours.     Signed:  Alma Friendly, MD [facility] 07/05/2018, 1:07 PM

## 2018-07-05 NOTE — Progress Notes (Signed)
Attempted to call report and was told that the nurse is currently at bedside with another admission. This Probation officer provided call back number and name for nurse to call with any questions or concerns.

## 2018-07-05 NOTE — Progress Notes (Signed)
Report called to Renee at [facility]. All questions and concerns answered.

## 2018-07-05 NOTE — Clinical Social Work Placement (Signed)
Pt admitting to [place] room 210- report [phone] Son at bedside aware/agreeable and [facility] transport arranged.   CLINICAL SOCIAL WORK PLACEMENT  NOTE  Date:  07/05/2018  Patient Details  Name: Patrick Walker MRN: [id number] Date of Birth: May 02, 1925  Clinical Social Work is seeking post-discharge placement for this patient at the [place] level of care (*CSW will initial, date and re-position this form in  chart as items are completed):  Yes   Patient/family provided with [facility] Work Department's list of facilities offering this level of care within the geographic area requested by the patient (or if unable, by the patient's family).  Yes   Patient/family informed of their freedom to choose among providers that offer the needed level of care, that participate in Medicare, Medicaid or managed care program needed by the patient, have an available bed and are willing to accept the patient.  Yes   Patient/family informed of [facility]'s ownership interest in [facility] and [facility], as well as of the fact that they are under no obligation to receive care at these facilities.  PASRR submitted to EDS on       PASRR number received on 07/04/18     Existing PASRR number confirmed on       FL2 transmitted to all facilities in geographic area requested by pt/family on 07/04/18     FL2 transmitted to all facilities within larger geographic area on       Patient informed that his/her managed care company has contracts with or will negotiate with certain facilities, including the following:        Yes   Patient/family informed of bed offers received.  Patient chooses bed at [facility], [place]     Physician recommends and patient chooses bed at [facility] [place], [place]    Patient to be transferred to [facility], [place] on 07/05/18.  Patient to be transferred to facility by [facility]     Patient family notified on  07/05/18 of transfer.  Name of family member notified:  son Elta [place]     PHYSICIAN       Additional Comment:    _______________________________________________ Nila Nephew, LCSW 07/05/2018, 3:12 PM [phone]

## 2018-07-09 LAB — CULTURE, BLOOD (ROUTINE X 2)
CULTURE: NO GROWTH
CULTURE: NO GROWTH
SPECIAL REQUESTS: ADEQUATE
Special Requests: ADEQUATE

## 2018-07-18 ENCOUNTER — Other Ambulatory Visit: Payer: Self-pay | Admitting: Neurosurgery

## 2018-07-18 DIAGNOSIS — S12030A Displaced posterior arch fracture of first cervical vertebra, initial encounter for closed fracture: Secondary | ICD-10-CM

## 2018-07-20 ENCOUNTER — Ambulatory Visit
Admission: RE | Admit: 2018-07-20 | Discharge: 2018-07-20 | Disposition: A | Discharge status: Home / Self Care | Payer: Medicare Other | Source: Ambulatory Visit | Attending: Neurosurgery | Admitting: Neurosurgery

## 2018-07-20 DIAGNOSIS — S12030A Displaced posterior arch fracture of first cervical vertebra, initial encounter for closed fracture: Secondary | ICD-10-CM

## 2018-09-17 ENCOUNTER — Other Ambulatory Visit: Payer: Self-pay

## 2018-09-17 ENCOUNTER — Emergency Department (HOSPITAL_COMMUNITY): Payer: Medicare Other

## 2018-09-17 ENCOUNTER — Encounter (HOSPITAL_COMMUNITY): Payer: Self-pay | Admitting: Emergency Medicine

## 2018-09-17 ENCOUNTER — Emergency Department (HOSPITAL_COMMUNITY)
Admission: EM | Admit: 2018-09-17 | Discharge: 2018-09-17 | Disposition: A | Discharge status: Home / Self Care | Payer: Medicare Other | Attending: Emergency Medicine | Admitting: Emergency Medicine

## 2018-09-17 DIAGNOSIS — I2510 Atherosclerotic heart disease of native coronary artery without angina pectoris: Secondary | ICD-10-CM | POA: Diagnosis not present

## 2018-09-17 DIAGNOSIS — N183 Chronic kidney disease, stage 3 (moderate): Secondary | ICD-10-CM | POA: Insufficient documentation

## 2018-09-17 DIAGNOSIS — G259 Extrapyramidal and movement disorder, unspecified: Secondary | ICD-10-CM | POA: Diagnosis not present

## 2018-09-17 DIAGNOSIS — Z79899 Other long term (current) drug therapy: Secondary | ICD-10-CM | POA: Insufficient documentation

## 2018-09-17 DIAGNOSIS — R4182 Altered mental status, unspecified: Secondary | ICD-10-CM | POA: Diagnosis present

## 2018-09-17 DIAGNOSIS — J189 Pneumonia, unspecified organism: Secondary | ICD-10-CM | POA: Insufficient documentation

## 2018-09-17 LAB — CBC WITH DIFFERENTIAL/PLATELET
ABS IMMATURE GRANULOCYTES: 0.03 10*3/uL (ref 0.00–0.07)
Basophils Absolute: 0 10*3/uL (ref 0.0–0.1)
Basophils Relative: 0 %
Eosinophils Absolute: 0 10*3/uL (ref 0.0–0.5)
Eosinophils Relative: 1 %
HEMATOCRIT: 32.4 % — AB (ref 39.0–52.0)
Hemoglobin: 9.9 g/dL — ABNORMAL LOW (ref 13.0–17.0)
IMMATURE GRANULOCYTES: 1 %
LYMPHS ABS: 0.9 10*3/uL (ref 0.7–4.0)
LYMPHS PCT: 21 %
MCH: 29 pg (ref 26.0–34.0)
MCHC: 30.6 g/dL (ref 30.0–36.0)
MCV: 95 fL (ref 80.0–100.0)
MONOS PCT: 17 %
Monocytes Absolute: 0.7 10*3/uL (ref 0.1–1.0)
NEUTROS ABS: 2.4 10*3/uL (ref 1.7–7.7)
NEUTROS PCT: 60 %
Platelets: 270 10*3/uL (ref 150–400)
RBC: 3.41 MIL/uL — ABNORMAL LOW (ref 4.22–5.81)
RDW: 13.4 % (ref 11.5–15.5)
WBC: 4.1 10*3/uL (ref 4.0–10.5)
nRBC: 0 % (ref 0.0–0.2)

## 2018-09-17 LAB — URINALYSIS, ROUTINE W REFLEX MICROSCOPIC
Bilirubin Urine: NEGATIVE
Glucose, UA: NEGATIVE mg/dL
Ketones, ur: NEGATIVE mg/dL
LEUKOCYTES UA: NEGATIVE
NITRITE: NEGATIVE
PROTEIN: 100 mg/dL — AB
Specific Gravity, Urine: 1.01 (ref 1.005–1.030)
pH: 7 (ref 5.0–8.0)

## 2018-09-17 LAB — COMPREHENSIVE METABOLIC PANEL
ALT: 14 U/L (ref 0–44)
AST: 21 U/L (ref 15–41)
Albumin: 2.6 g/dL — ABNORMAL LOW (ref 3.5–5.0)
Alkaline Phosphatase: 53 U/L (ref 38–126)
Anion gap: 7 (ref 5–15)
BILIRUBIN TOTAL: 0.5 mg/dL (ref 0.3–1.2)
BUN: 15 mg/dL (ref 8–23)
CHLORIDE: 104 mmol/L (ref 98–111)
CO2: 28 mmol/L (ref 22–32)
CREATININE: 1.08 mg/dL (ref 0.61–1.24)
Calcium: 9.3 mg/dL (ref 8.9–10.3)
GFR calc Af Amer: >60 mL/min (ref >60)
GFR, EST NON AFRICAN AMERICAN: 60 mL/min — AB (ref >60)
Glucose, Bld: 106 mg/dL — ABNORMAL HIGH (ref 70–99)
Potassium: 4.2 mmol/L (ref 3.5–5.1)
Sodium: 139 mmol/L (ref 135–145)
Total Protein: 7.1 g/dL (ref 6.5–8.1)

## 2018-09-17 LAB — I-STAT TROPONIN, ED: TROPONIN I, POC: 0.02 ng/mL (ref 0.00–0.08)

## 2018-09-17 LAB — URINALYSIS, MICROSCOPIC (REFLEX)
Bacteria, UA: NONE SEEN
Squamous Epithelial / LPF: NONE SEEN (ref 0–5)

## 2018-09-17 LAB — MAGNESIUM: Magnesium: 1.6 mg/dL — ABNORMAL LOW (ref 1.7–2.4)

## 2018-09-17 MED ORDER — DOXYCYCLINE HYCLATE 100 MG PO TABS
100.0000 mg | ORAL_TABLET | Freq: Once | ORAL | Status: AC
  Administered 2018-09-17: 100 mg via ORAL
  Filled 2018-09-17: qty 1

## 2018-09-17 MED ORDER — MAGNESIUM SULFATE IN D5W 1-5 GM/100ML-% IV SOLN: 1.0000 g | Freq: Once | INTRAVENOUS | Status: DC

## 2018-09-17 MED ORDER — HYDROXYZINE HCL 10 MG/5ML PO SYRP
10.0000 mg | ORAL_SOLUTION | Freq: Once | ORAL | Status: AC
  Administered 2018-09-17: 10 mg via ORAL
  Filled 2018-09-17: qty 5

## 2018-09-17 MED ORDER — DOXYCYCLINE HYCLATE 100 MG PO CAPS: 100.0000 mg | ORAL_CAPSULE | Freq: Two times a day (BID) | ORAL | 0 refills | Status: DC

## 2018-09-17 MED ORDER — MAGNESIUM SULFATE IN D5W 1-5 GM/100ML-% IV SOLN
1.0000 g | Freq: Once | INTRAVENOUS | Status: AC
  Administered 2018-09-17: 1 g via INTRAVENOUS
  Filled 2018-09-17: qty 100

## 2018-09-17 NOTE — ED Notes (Signed)
Pt transported to xray 

## 2018-09-17 NOTE — Discharge Instructions (Addendum)
The cause of Patrick Walker' symptoms was not identified today. He has a possible pneumonia on his chest x-ray. Please have him rechecked immediately if he has any new or concerning symptoms. Please try to get video of his abnormal movements the next time they occur.

## 2018-09-17 NOTE — ED Provider Notes (Signed)
[facility] EMERGENCY DEPARTMENT Provider Note   CSN: [id number] Arrival date & time: 09/17/18  0940     History   Chief Complaint Chief Complaint  Patient presents with  . Seizures    HPI Patrick Walker is a 85 y.o. male.  The history is provided by the EMS personnel, medical records and a relative. No language interpreter was used.  Seizures   Patrick Walker is a 85 y.o. male who presents to the Emergency Department complaining of AMS. Level V caveat due to altered mental status. History is provided by EMS and the patient's son. He presents to the emergency department from collapse assisted living facility for seizure like activity. He was sitting in his Geri-chair today and eating breakfast when he developed large amplitude movements of his arms and legs. Episodes lasted a few seconds at a time. He did not lose consciousness during these episodes but did stop his eating. He then would return to eating. No reports of recent illnesses or medication changes. He did recently relocate from skilled nursing to assisted living facility. He is not ambulatory at baseline and minimally verbal due to advanced Alzheimer's. The patient's son states that he has at times had brief similar episodes but nothing lasting this long or being this frequent. Past Medical History:  Diagnosis Date  . Aortic insufficiency    Mild, echo, November, 2011  . Aortic valve sclerosis    Echo, November, 2011  . Arthritis    "joints probably" (09/23/2017)  . Atrial fibrillation ([place])   . Atrial flutter [facility])    New diagnosis November, 2012  . CKD (chronic kidney disease), stage III ([place])    [name] 09/23/2017  . Difficulty in urination   . Ejection fraction    EF 55-60%, echo, November, 2012 ( rapid atrial flutter at that time)  . Fall from standing 09/22/2017   sustained a open depressed frontal skull fracture with some pneumocephalus. [name] 09/23/2017  . Glaucoma   . Hearing loss   .  Irritable bowel disease   . Sinus bradycardia     Patient Active Problem List   Diagnosis Date Noted  . Protein-calorie malnutrition, severe 07/01/2018  . Viral gastroenteritis 06/29/2018  . History of fall 06/29/2018  . Atrial fibrillation ([place]) 06/29/2018  . CKD (chronic kidney disease) stage 3, GFR 30-59 ml/min (HCC) 06/29/2018  . BPH (benign prostatic hyperplasia) 06/29/2018  . Insomnia 06/29/2018  . Chronic anemia 06/29/2018  . Physical deconditioning 06/29/2018  . Adrenal nodule ([place]) 06/29/2018  . Neck fracture ([place]) 06/12/2018  . Acute coronary syndrome ([place]) 06/11/2018  . Skull fracture ([place]) 09/22/2017  . Pneumocephalus, traumatic 09/22/2017  . Fall at home, initial encounter 09/22/2017  . Intertrochanteric fracture of right femur ([place]) 12/15/2016  . Malnutrition of moderate degree 02/24/2016  . Acute kidney injury ([place]) 02/23/2016  . Diverticulitis 02/23/2016  . Dehydration 02/23/2016  . Constipation 02/23/2016  . Nausea 02/23/2016  . Generalized weakness 02/23/2016  . Sinus bradycardia   . Long term current use of anticoagulant 08/04/2011  . Aortic valve sclerosis   . Aortic insufficiency   . Atrial flutter ([place])   . TSH (thyroid-stimulating hormone deficiency)   . Hearing loss   . Shortness of breath   . Inguinal hernia - left s/p lap [facility] repair [id number] 03/16/2011    Past Surgical History:  Procedure Laterality Date  . BACK SURGERY  2008-2009  . CATARACT EXTRACTION W/ INTRAOCULAR LENS  IMPLANT, BILATERAL Bilateral   .  FRACTURE SURGERY    . HAND SURGERY Right    "chainsaw about cut it off"  . INGUINAL HERNIA REPAIR  03/02/2011  . INTRAMEDULLARY (IM) NAIL INTERTROCHANTERIC Right 12/15/2016   Procedure: INTRAMEDULLARY (IM) NAIL INTERTROCHANTRIC;  Surgeon: Renette Butters, MD;  Location: [facility];  Service: Orthopedics;  Laterality: Right;  . LEG SURGERY Left 1972   "crushed"        Home Medications    Prior to Admission medications   Medication Sig  Start Date End Date Taking? Authorizing Provider  acetaminophen (TYLENOL) 325 MG tablet Take 325 mg by mouth 3 (three) times daily.    Yes [provider]  albuterol (ACCUNEB) 0.63 MG/3ML nebulizer solution Take 1 ampule by nebulization every 4 (four) hours as needed for wheezing.   Yes [provider]  ALPRAZolam (XANAX) 0.25 MG tablet Take 0.25 mg by mouth every 8 (eight) hours as needed for anxiety.   Yes [provider]  hydrOXYzine (VISTARIL) 25 MG capsule Take 25 mg by mouth at bedtime. 03/10/18  Yes [provider]  latanoprost (XALATAN) 0.005 % ophthalmic solution Place 1 drop into both eyes at bedtime.   Yes [provider]  OXYGEN Inhale 2 L/min into the lungs as needed (low O2).   Yes [provider]  polyethylene glycol (MIRALAX / GLYCOLAX) packet Take 17 g by mouth at bedtime.   Yes [provider]  Prenatal Vit-Fe Fumarate-FA (PRENATAL VITAMIN PO) Take 1 tablet by mouth at bedtime.    Yes [provider]  senna-docusate (SENOKOT-S) 8.6-50 MG tablet Take 1 tablet by mouth daily.   Yes [provider]  tamsulosin (FLOMAX) 0.4 MG CAPS capsule Take 0.4 mg by mouth at bedtime.   Yes [provider]  traMADol (ULTRAM) 50 MG tablet Take 50 mg by mouth every 8 (eight) hours as needed for moderate pain.   Yes [provider]  traZODone (DESYREL) 150 MG tablet Take 150 mg by mouth at bedtime.  06/20/18  Yes [provider]  vitamin B-12 (CYANOCOBALAMIN) 1000 MCG tablet Take 1,000 mcg by mouth daily.    Yes [provider]  Vitamin D, Ergocalciferol, (DRISDOL) 1.25 MG (50000 UT) CAPS capsule Take 50,000 Units by mouth every 30 (thirty) days.   Yes [provider]  doxycycline (VIBRAMYCIN) 100 MG capsule Take 1 capsule (100 mg total) by mouth 2 (two) times daily. 09/17/18   Quintella Reichert, MD  feeding supplement, ENSURE ENLIVE, (ENSURE ENLIVE) LIQD Take 237 mLs by mouth 2  (two) times daily between meals. Patient not taking: Reported on 09/17/2018 07/05/18   Alma Friendly, MD    Family History No family history on file.  Social History Social History   Tobacco Use  . Smoking status: Never Smoker  . Smokeless tobacco: Never Used  Substance Use Topics  . Alcohol use: No  . Drug use: No     Allergies   Codeine and Morphine and related   Review of Systems Review of Systems  Neurological: Positive for seizures.  All other systems reviewed and are negative.    Physical Exam Updated Vital Signs BP (!) 161/77   Pulse 78   Resp 14   Ht 5\' 5"  (1.651 m)   SpO2 97%   BMI 16.40 kg/m   Physical Exam Vitals signs and nursing note reviewed.  Constitutional:      Appearance: He is well-developed.  HENT:     Head: Normocephalic and atraumatic.  Cardiovascular:  Rate and Rhythm: Normal rate and regular rhythm.     Heart sounds: Murmur present.  Pulmonary:     Effort: Pulmonary effort is normal. No respiratory distress.     Breath sounds: Normal breath sounds.  Abdominal:     Palpations: Abdomen is soft.     Tenderness: There is no abdominal tenderness. There is no guarding or rebound.  Musculoskeletal:        General: No swelling or tenderness.  Skin:    General: Skin is warm and dry.  Neurological:     Mental Status: He is alert.     Comments: Markedly confused with unintelligible speech. Does not follow commands. Resists eye-opening strongly. Moves all four extremities spontaneously.  Psychiatric:     Comments: Unable to assess      ED Treatments / Results  Labs (all labs ordered are listed, but only abnormal results are displayed) Labs Reviewed  CBC WITH DIFFERENTIAL/PLATELET - Abnormal; Notable for the following components:      Result Value   RBC 3.41 (*)    Hemoglobin 9.9 (*)    HCT 32.4 (*)    All other components within normal limits  COMPREHENSIVE METABOLIC PANEL - Abnormal; Notable for the following  components:   Glucose, Bld 106 (*)    Albumin 2.6 (*)    GFR calc non Af Amer 60 (*)    All other components within normal limits  URINALYSIS, ROUTINE W REFLEX MICROSCOPIC - Abnormal; Notable for the following components:   Color, Urine YELLOW (*)    APPearance CLEAR (*)    Hgb urine dipstick SMALL (*)    Protein, ur 100 (*)    All other components within normal limits  MAGNESIUM - Abnormal; Notable for the following components:   Magnesium 1.6 (*)    All other components within normal limits  URINE CULTURE  URINALYSIS, MICROSCOPIC (REFLEX)  I-STAT TROPONIN, ED    EKG EKG Interpretation  Date/Time:  Saturday September 17 2018 09:45:03 EST Ventricular Rate:  87 PR Interval:    QRS Duration: 83 QT Interval:  397 QTC Calculation: 478 R Axis:   16 Text Interpretation:  Sinus rhythm Probable left atrial enlargement Probable left ventricular hypertrophy Borderline prolonged QT interval Confirmed by Quintella Reichert [phone]) on 09/17/2018 10:07:07 AM   Radiology Dg Chest 1 View  Result Date: 09/17/2018 CLINICAL DATA:  Altered mental status EXAM: CHEST  1 VIEW COMPARISON:  July 04, 2018 FINDINGS: There is airspace consolidation in portions of both lower lobes. There is a small left pleural effusion. There are scattered calcified granulomas. There is mild cardiomegaly with pulmonary vascularity normal. There is aortic atherosclerosis. No adenopathy. Bones are diffusely osteoporotic. Patient has undergone previous kyphoplasty procedure at L1. IMPRESSION: Bibasilar airspace consolidation consistent with pneumonia. Small left pleural effusion. Calcified granulomas noted in the lungs bilaterally. There is mild cardiomegaly. There is aortic atherosclerosis. Bones are osteoporotic. Aortic Atherosclerosis (ICD10-I70.0). Electronically Signed   By: Lowella Grip III M.D.   On: 09/17/2018 11:45   Ct Head Wo Contrast  Result Date: 09/17/2018 CLINICAL DATA:  Altered mental status. Witnessed  seizure. EXAM: CT HEAD WITHOUT CONTRAST TECHNIQUE: Contiguous axial images were obtained from the base of the skull through the vertex without intravenous contrast. COMPARISON:  06/29/2018 head CT. FINDINGS: Brain: Motion degraded scan, limiting assessment. No evidence of parenchymal hemorrhage or extra-axial fluid collection. No mass lesion, mass effect, or midline shift. No CT evidence of acute infarction. Generalized cerebral volume loss. Nonspecific prominent subcortical and  periventricular white matter hypodensity, most in keeping with chronic small vessel ischemic change. Stable right basal ganglia lacunar infarct. Cerebral ventricle sizes are stable and concordant with the degree of cerebral volume loss. Vascular: No acute abnormality. Skull: No evidence of acute calvarial fracture. Healed anterior right frontal calvarial deformity. Sinuses/Orbits: The visualized paranasal sinuses are essentially clear. Other:  The mastoid air cells are unopacified. IMPRESSION: 1. No evidence of acute intracranial abnormality. 2. No evidence of acute calvarial fracture. Healed anterior right frontal calvarial deformity. 3. Generalized cerebral volume loss and prominent chronic small vessel ischemic changes in the cerebral white matter. 4. Stable right basal ganglia lacunar infarct. Electronically Signed   By: Ilona Sorrel M.D.   On: 09/17/2018 11:01    Procedures Procedures (including critical care time)  Medications Ordered in ED Medications  magnesium sulfate IVPB 1 g 100 mL (0 g Intravenous Stopped 09/17/18 1342)  doxycycline (VIBRA-TABS) tablet 100 mg (100 mg Oral Given 09/17/18 1411)  hydrOXYzine (ATARAX) 10 MG/5ML syrup 10 mg (10 mg Oral Given 09/17/18 1509)     Initial Impression / Assessment and Plan / ED Course  I have reviewed the triage vital signs and the nursing notes.  Pertinent labs & imaging results that were available during my care of the patient were reviewed by me and considered in my medical  decision making (see chart for details).     Patient with advanced dementia here for evaluation of jerking episodes. None were witnessed on patients ED arrival. By history these do not sound like seizure episodes. Patient without new head injuries. Family state that he has progressive decline in his mental status over the last several weeks. CT scan with chronic changes, no acute abnormalities. Chest x-ray with possible pneumonia. Given the change in his status, will treat for pneumonia with antibiotics. Discussed with patient treatment options for inpatient observation versus discharge back to facility with PCP and potentially palliative care follow-up. Plan to discharge to facility with close outpatient follow-up and return precautions.  Final Clinical Impressions(s) / ED Diagnoses   Final diagnoses:  Community acquired pneumonia, unspecified laterality  Movement disorder    ED Discharge Orders         Ordered    doxycycline (VIBRAMYCIN) 100 MG capsule  2 times daily     09/17/18 1352           Quintella Reichert, MD 09/17/18 [phone]

## 2018-09-17 NOTE — ED Triage Notes (Signed)
PER [facility]-pt picked up from [facility] reports witnessed seizure, described as arms and legs moving all around when moving

## 2018-09-17 NOTE — ED Notes (Signed)
Helped get patient undress on the monitor did ekg shown to Dr Ralene Bathe patient had a loose stool patient was cleaned up patient is resting with call bell in reach

## 2018-09-17 NOTE — ED Notes (Signed)
Pt is dressed and waiting for PTAR. Family at bedside.

## 2018-09-17 NOTE — ED Notes (Signed)
CALLED [facility] FOR PT TRASNPORT TO [facility]

## 2018-09-18 LAB — URINE CULTURE: CULTURE: NO GROWTH

## 2018-10-21 ENCOUNTER — Non-Acute Institutional Stay: Payer: Medicare Other | Admitting: Adult Health Nurse Practitioner

## 2018-10-21 ENCOUNTER — Encounter: Payer: Self-pay | Admitting: Adult Health Nurse Practitioner

## 2018-10-21 DIAGNOSIS — Z515 Encounter for palliative care: Secondary | ICD-10-CM

## 2018-10-21 NOTE — Progress Notes (Signed)
[facility] Consult Note Telephone: [phone]  Fax: [phone] 10/21/2018  PATIENT NAME: Patrick Walker DOB: 1924-09-16 MRN: [id number]  PRIMARY CARE PROVIDER:   Leonard Downing, MD  REFERRING PROVIDER:  Leonard Downing, MD [place], [place] [address]  RESPONSIBLE PARTY:        RECOMMENDATIONS and PLAN:  1.   Pain. Patient has had fall in past causing cervical spine fracture.  Patient does have pain in neck and shoulders related to arthritis. This is well controlled with PRN tramadol at this time.    2.  Pulmonary edema. Patient had recent chest xray showing pulmonary edema and patient was started on lasix.  Repeat chest xray and BMP in one week ordered by PCP.  3.  Appetite. Son states that his lowest weight was 96 pounds.  Currently weighs 99.2 pounds.  Son does report that he eats better when he has help eating.  Will generally eat up to 100% of breakfast with son's help  4.  Anxiety/agitation.  Patient will become fearful and rigid when staff provides care.  Patient currently on trazodone 150 mg QHS and hydroxyzine 25 mg QHS.  Has Ativan 0.25 mg PO Q8 hrs PRN. Encourage staff and family to explain to the patient what they are doing but patient is very [place].    5.  Advanced care planning.  Patient has DNR in place.  Went over MOST form with son. Son wants to discuss with other family members and will meet again in a week or two to sign form and leave at facility.   I spent 45 minutes providing this consultation,  from 8:45 to 9:30. More than 50% of the time in this consultation was spent coordinating communication.   HISTORY OF PRESENT ILLNESS:  Patrick Walker is a 85 y.o. year old male with multiple medical problems including Alzheimer's dementia, arthritis, a-fib, glaucoma, IBS. Palliative Care was asked to help address goals of care. Family would like to keep patient at ALF but are aware that he does  require more one on one care than other residents.  Staff trying to keep patient in ALF as long as possible but are aware of potential safety issues for patient and staff.  CODE STATUS: DNR  PPS: 50% HOSPICE ELIGIBILITY/DIAGNOSIS: TBD  PHYSICAL EXAM:   General: NAD, frail appearing, thin Cardiovascular: regular rate and rhythm, soft 2/6 systolic murmur heard Pulmonary: clear ant fields Abdomen: soft, nontender, + bowel sounds GU: no suprapubic tenderness Extremities: no edema, no joint deformities Neurological: Patient will respond to name.  Does speak much.  No facial drooping.  HOH but will follow commands when understands  PAST MEDICAL HISTORY:  Past Medical History:  Diagnosis Date  . Aortic insufficiency    Mild, echo, November, 2011  . Aortic valve sclerosis    Echo, November, 2011  . Arthritis    "joints probably" (09/23/2017)  . Atrial fibrillation ([place])   . Atrial flutter [facility])    New diagnosis November, 2012  . CKD (chronic kidney disease), stage III ([place])    [name] 09/23/2017  . Difficulty in urination   . Ejection fraction    EF 55-60%, echo, November, 2012 ( rapid atrial flutter at that time)  . Fall from standing 09/22/2017   sustained a open depressed frontal skull fracture with some pneumocephalus. [name] 09/23/2017  . Glaucoma   . Hearing loss   . Irritable bowel disease   . Sinus bradycardia  SOCIAL HX:  Social History   Tobacco Use  . Smoking status: Never Smoker  . Smokeless tobacco: Never Used  Substance Use Topics  . Alcohol use: No    ALLERGIES:  Allergies  Allergen Reactions  . Codeine Nausea And Vomiting  . Morphine And Related Nausea And Vomiting     PERTINENT MEDICATIONS:  Outpatient Encounter Medications as of 10/21/2018  Medication Sig  . acetaminophen (TYLENOL) 325 MG tablet Take 325 mg by mouth 3 (three) times daily.   Marland Kitchen albuterol (ACCUNEB) 0.63 MG/3ML nebulizer solution Take 1 ampule by nebulization every 4 (four) hours as  needed for wheezing.  Marland Kitchen ALPRAZolam (XANAX) 0.25 MG tablet Take 0.25 mg by mouth every 8 (eight) hours as needed for anxiety.  Marland Kitchen doxycycline (VIBRAMYCIN) 100 MG capsule Take 1 capsule (100 mg total) by mouth 2 (two) times daily.  . feeding supplement, ENSURE ENLIVE, (ENSURE ENLIVE) LIQD Take 237 mLs by mouth 2 (two) times daily between meals. (Patient not taking: Reported on 09/17/2018)  . hydrOXYzine (VISTARIL) 25 MG capsule Take 25 mg by mouth at bedtime.  Marland Kitchen latanoprost (XALATAN) 0.005 % ophthalmic solution Place 1 drop into both eyes at bedtime.  . OXYGEN Inhale 2 L/min into the lungs as needed (low O2).  . polyethylene glycol (MIRALAX / GLYCOLAX) packet Take 17 g by mouth at bedtime.  . Prenatal Vit-Fe Fumarate-FA (PRENATAL VITAMIN PO) Take 1 tablet by mouth at bedtime.   . senna-docusate (SENOKOT-S) 8.6-50 MG tablet Take 1 tablet by mouth daily.  . tamsulosin (FLOMAX) 0.4 MG CAPS capsule Take 0.4 mg by mouth at bedtime.  . traMADol (ULTRAM) 50 MG tablet Take 50 mg by mouth every 8 (eight) hours as needed for moderate pain.  . traZODone (DESYREL) 150 MG tablet Take 150 mg by mouth at bedtime.   . vitamin B-12 (CYANOCOBALAMIN) 1000 MCG tablet Take 1,000 mcg by mouth daily.   . Vitamin D, Ergocalciferol, (DRISDOL) 1.25 MG (50000 UT) CAPS capsule Take 50,000 Units by mouth every 30 (thirty) days.   No facility-administered encounter medications on file as of 10/21/2018.       Amy Jenetta Downer, NP

## 2018-12-30 DEATH — deceased

## 2019-08-23 NOTE — Telephone Encounter (Signed)
error
